# Patient Record
Sex: Female | Born: 1935 | Race: Black or African American | Hispanic: No | Smoking: Never smoker
Health system: Southern US, Community
[De-identification: ages and names within clinical notes are randomized; demographics above are authoritative.]

## PROBLEM LIST (undated history)

## (undated) DIAGNOSIS — E7800 Pure hypercholesterolemia, unspecified: Secondary | ICD-10-CM

## (undated) DIAGNOSIS — I639 Cerebral infarction, unspecified: Secondary | ICD-10-CM

## (undated) DIAGNOSIS — R569 Unspecified convulsions: Secondary | ICD-10-CM

## (undated) DIAGNOSIS — R7881 Bacteremia: Secondary | ICD-10-CM

## (undated) DIAGNOSIS — A419 Sepsis, unspecified organism: Secondary | ICD-10-CM

## (undated) DIAGNOSIS — K810 Acute cholecystitis: Secondary | ICD-10-CM

## (undated) DIAGNOSIS — J42 Unspecified chronic bronchitis: Secondary | ICD-10-CM

## (undated) DIAGNOSIS — E119 Type 2 diabetes mellitus without complications: Secondary | ICD-10-CM

## (undated) DIAGNOSIS — I10 Essential (primary) hypertension: Secondary | ICD-10-CM

## (undated) DIAGNOSIS — N390 Urinary tract infection, site not specified: Secondary | ICD-10-CM

## (undated) DIAGNOSIS — Z1621 Resistance to vancomycin: Secondary | ICD-10-CM

## (undated) DIAGNOSIS — D332 Benign neoplasm of brain, unspecified: Secondary | ICD-10-CM

## (undated) HISTORY — PX: CT PERC CHOLECYSTOSTOMY: HXRAD817

## (undated) HISTORY — PX: ABDOMINAL HYSTERECTOMY: SHX81

## (undated) HISTORY — PX: BRAIN SURGERY: SHX531

---

## 2014-03-11 ENCOUNTER — Emergency Department (HOSPITAL_COMMUNITY): Payer: Medicare Other

## 2014-03-11 ENCOUNTER — Encounter (HOSPITAL_COMMUNITY): Payer: Self-pay | Admitting: Emergency Medicine

## 2014-03-11 ENCOUNTER — Inpatient Hospital Stay (HOSPITAL_COMMUNITY)
Admission: EM | Admit: 2014-03-11 | Discharge: 2014-03-12 | DRG: 683 | Disposition: A | Discharge status: Home / Self Care | Payer: Medicare Other | Attending: Internal Medicine | Admitting: Internal Medicine

## 2014-03-11 DIAGNOSIS — N390 Urinary tract infection, site not specified: Secondary | ICD-10-CM

## 2014-03-11 DIAGNOSIS — Z7982 Long term (current) use of aspirin: Secondary | ICD-10-CM

## 2014-03-11 DIAGNOSIS — E119 Type 2 diabetes mellitus without complications: Secondary | ICD-10-CM | POA: Diagnosis present

## 2014-03-11 DIAGNOSIS — I69354 Hemiplegia and hemiparesis following cerebral infarction affecting left non-dominant side: Secondary | ICD-10-CM

## 2014-03-11 DIAGNOSIS — E780 Pure hypercholesterolemia: Secondary | ICD-10-CM | POA: Diagnosis present

## 2014-03-11 DIAGNOSIS — I10 Essential (primary) hypertension: Secondary | ICD-10-CM | POA: Diagnosis present

## 2014-03-11 DIAGNOSIS — R531 Weakness: Secondary | ICD-10-CM

## 2014-03-11 DIAGNOSIS — I69359 Hemiplegia and hemiparesis following cerebral infarction affecting unspecified side: Secondary | ICD-10-CM

## 2014-03-11 DIAGNOSIS — Z90710 Acquired absence of both cervix and uterus: Secondary | ICD-10-CM

## 2014-03-11 DIAGNOSIS — G40909 Epilepsy, unspecified, not intractable, without status epilepticus: Secondary | ICD-10-CM

## 2014-03-11 DIAGNOSIS — R55 Syncope and collapse: Secondary | ICD-10-CM | POA: Diagnosis not present

## 2014-03-11 DIAGNOSIS — J42 Unspecified chronic bronchitis: Secondary | ICD-10-CM | POA: Diagnosis present

## 2014-03-11 DIAGNOSIS — F0390 Unspecified dementia without behavioral disturbance: Secondary | ICD-10-CM | POA: Insufficient documentation

## 2014-03-11 DIAGNOSIS — N179 Acute kidney failure, unspecified: Secondary | ICD-10-CM | POA: Diagnosis not present

## 2014-03-11 DIAGNOSIS — W19XXXA Unspecified fall, initial encounter: Secondary | ICD-10-CM

## 2014-03-11 DIAGNOSIS — I951 Orthostatic hypotension: Secondary | ICD-10-CM | POA: Diagnosis present

## 2014-03-11 DIAGNOSIS — Z885 Allergy status to narcotic agent status: Secondary | ICD-10-CM

## 2014-03-11 DIAGNOSIS — E860 Dehydration: Secondary | ICD-10-CM | POA: Diagnosis present

## 2014-03-11 HISTORY — DX: Cerebral infarction, unspecified: I63.9

## 2014-03-11 HISTORY — DX: Unspecified convulsions: R56.9

## 2014-03-11 HISTORY — DX: Type 2 diabetes mellitus without complications: E11.9

## 2014-03-11 HISTORY — DX: Essential (primary) hypertension: I10

## 2014-03-11 HISTORY — DX: Unspecified chronic bronchitis: J42

## 2014-03-11 HISTORY — DX: Pure hypercholesterolemia, unspecified: E78.00

## 2014-03-11 HISTORY — DX: Benign neoplasm of brain, unspecified: D33.2

## 2014-03-11 LAB — URINE MICROSCOPIC-ADD ON

## 2014-03-11 LAB — CBC WITH DIFFERENTIAL/PLATELET
BASOS ABS: 0 10*3/uL (ref 0.0–0.1)
BASOS PCT: 0 % (ref 0–1)
Eosinophils Absolute: 0.3 10*3/uL (ref 0.0–0.7)
Eosinophils Relative: 3 % (ref 0–5)
HEMATOCRIT: 33.7 % — AB (ref 36.0–46.0)
HEMOGLOBIN: 10.9 g/dL — AB (ref 12.0–15.0)
Lymphocytes Relative: 12 % (ref 12–46)
Lymphs Abs: 1.2 10*3/uL (ref 0.7–4.0)
MCH: 32.2 pg (ref 26.0–34.0)
MCHC: 32.3 g/dL (ref 30.0–36.0)
MCV: 99.4 fL (ref 78.0–100.0)
MONO ABS: 0.7 10*3/uL (ref 0.1–1.0)
MONOS PCT: 7 % (ref 3–12)
NEUTROS ABS: 7.6 10*3/uL (ref 1.7–7.7)
Neutrophils Relative %: 78 % — ABNORMAL HIGH (ref 43–77)
Platelets: 248 10*3/uL (ref 150–400)
RBC: 3.39 MIL/uL — ABNORMAL LOW (ref 3.87–5.11)
RDW: 13.1 % (ref 11.5–15.5)
WBC: 9.8 10*3/uL (ref 4.0–10.5)

## 2014-03-11 LAB — URINALYSIS, ROUTINE W REFLEX MICROSCOPIC
Bilirubin Urine: NEGATIVE
Glucose, UA: NEGATIVE mg/dL
Hgb urine dipstick: NEGATIVE
Ketones, ur: NEGATIVE mg/dL
Nitrite: NEGATIVE
PROTEIN: NEGATIVE mg/dL
SPECIFIC GRAVITY, URINE: 1.02 (ref 1.005–1.030)
Urobilinogen, UA: 0.2 mg/dL (ref 0.0–1.0)
pH: 5.5 (ref 5.0–8.0)

## 2014-03-11 LAB — CARBAMAZEPINE LEVEL, TOTAL: CARBAMAZEPINE LVL: 9.8 ug/mL (ref 4.0–12.0)

## 2014-03-11 LAB — BASIC METABOLIC PANEL
ANION GAP: 12 (ref 5–15)
BUN: 27 mg/dL — AB (ref 6–23)
CO2: 28 mEq/L (ref 19–32)
Calcium: 8.8 mg/dL (ref 8.4–10.5)
Chloride: 99 mEq/L (ref 96–112)
Creatinine, Ser: 1.74 mg/dL — ABNORMAL HIGH (ref 0.50–1.10)
GFR calc non Af Amer: 25 mL/min — ABNORMAL LOW (ref >90)
GFR, EST AFRICAN AMERICAN: 29 mL/min — AB (ref >90)
Glucose, Bld: 273 mg/dL — ABNORMAL HIGH (ref 70–99)
POTASSIUM: 4.4 meq/L (ref 3.7–5.3)
Sodium: 139 mEq/L (ref 137–147)

## 2014-03-11 LAB — TROPONIN I: Troponin I: <0.3 ng/mL (ref <0.30)

## 2014-03-11 MED ORDER — DEXTROSE 5 % IV SOLN
1.0000 g | Freq: Once | INTRAVENOUS | Status: AC
  Administered 2014-03-11: 1 g via INTRAVENOUS
  Filled 2014-03-11: qty 10

## 2014-03-11 NOTE — H&P (Signed)
PCP:   No primary care provider on file.   Chief Complaint:  Almost Passed out  HPI: 80 yo female h/o cva with residual left sided weakness, htn, dm, benign brain tumor s/p resection ten years ago, seizure disorder comes in after getting very weak and almost passing out today witnessed by dtr.  She lives with her dtr.  Just dx with uti last couple of days but not given abx.  No fevers.  No report of sz like activity during event and did not LOC and no sz in years.  Not eating or drinking well for several days.  unk baseline renal function.  Her baseline is left sided hemiparesis needs help walking with a walker.  But has been more weak than normal for 2 days.  No n/v/d.  ??h/o dementia also.  Was back to her baseline quickly by time of arrival to ED.  Review of Systems:  Positive and negative as per HPI otherwise all other systems are negative per dtr.  Past Medical History: Past Medical History  Diagnosis Date  . Stroke   . Diabetes mellitus without complication   . Hypertension   . High cholesterol   . Brain tumor (benign)   . Chronic bronchitis   . Seizures    Past Surgical History  Procedure Laterality Date  . Brain surgery    . Abdominal hysterectomy      Medications: Prior to Admission medications   Medication Sig Start Date End Date Taking? Authorizing Provider  amLODipine (NORVASC) 10 MG tablet Take 10 mg by mouth daily.   Yes Historical Provider, MD  aspirin EC 81 MG tablet Take 81 mg by mouth daily.   Yes Historical Provider, MD  atenolol (TENORMIN) 50 MG tablet Take 50 mg by mouth daily.   Yes Historical Provider, MD  Calcium Carbonate-Vitamin D (CALCARB 600/D PO) Take 1 tablet by mouth 2 (two) times daily.   Yes Historical Provider, MD  carbamazepine (TEGRETOL) 200 MG tablet Take 200 mg by mouth 3 (three) times daily.   Yes Historical Provider, MD  donepezil (ARICEPT) 10 MG tablet Take 10 mg by mouth daily.   Yes Historical Provider, MD  escitalopram (LEXAPRO) 10  MG tablet Take 10 mg by mouth daily.   Yes Historical Provider, MD  Fe Fum-FA-B Cmp-C-Zn-Mg-Mn-Cu (HEMATINIC PLUS COMPLEX) 106-1 MG TABS Take 1 tablet by mouth daily.   Yes Historical Provider, MD  gabapentin (NEURONTIN) 100 MG capsule Take 100 mg by mouth daily as needed (for pain).   Yes Historical Provider, MD  glimepiride (AMARYL) 4 MG tablet Take 4 mg by mouth daily with breakfast.   Yes Historical Provider, MD  HYDROcodone-acetaminophen (NORCO/VICODIN) 5-325 MG per tablet Take 1 tablet by mouth daily as needed for moderate pain.   Yes Historical Provider, MD  levothyroxine (SYNTHROID, LEVOTHROID) 25 MCG tablet Take 50 mcg by mouth daily before breakfast.    Yes Historical Provider, MD  meclizine (ANTIVERT) 25 MG tablet Take 25 mg by mouth daily as needed for dizziness.   Yes Historical Provider, MD  memantine (NAMENDA) 10 MG tablet Take 10 mg by mouth 2 (two) times daily.   Yes Historical Provider, MD  pantoprazole (PROTONIX) 20 MG tablet Take 20 mg by mouth daily.   Yes Historical Provider, MD  rosuvastatin (CRESTOR) 20 MG tablet Take 20 mg by mouth every evening.   Yes Historical Provider, MD  valsartan-hydrochlorothiazide (DIOVAN-HCT) 320-25 MG per tablet Take 1 tablet by mouth daily.   Yes Historical Provider, MD  Allergies:   Allergies  Allergen Reactions  . Codeine Other (See Comments)    Unknown reaction    Social History:  reports that she has never smoked. She does not have any smokeless tobacco history on file. She reports that she does not drink alcohol or use illicit drugs.  Family History: CAD  Physical Exam: Filed Vitals:   03/11/14 2041 03/11/14 2109 03/11/14 2130 03/11/14 2230  BP: 138/63  124/64 144/62  Pulse: 76  75   Temp:  98.6 F (37 C)    TempSrc:  Rectal    Resp: 17  15 14   Height:      Weight:      SpO2: 90%  93%    General appearance: alert, cooperative and no distress Head: Normocephalic, without obvious abnormality, atraumatic Eyes:  negative Nose: Nares normal. Septum midline. Mucosa normal. No drainage or sinus tenderness. Neck: no JVD and supple, symmetrical, trachea midline Lungs: clear to auscultation bilaterally Heart: regular rate and rhythm, S1, S2 normal, no murmur, click, rub or gallop Abdomen: soft, non-tender; bowel sounds normal; no masses,  no organomegaly Extremities: extremities normal, atraumatic, no cyanosis or edema Pulses: 2+ and symmetric Skin: Skin color, texture, turgor normal. No rashes or lesions Neurologic: Grossly normalleft sided hemiparesis    Labs on Admission:   Recent Labs  03/11/14 2007  NA 139  K 4.4  CL 99  CO2 28  GLUCOSE 273*  BUN 27*  CREATININE 1.74*  CALCIUM 8.8    Recent Labs  03/11/14 2007  WBC 9.8  NEUTROABS 7.6  HGB 10.9*  HCT 33.7*  MCV 99.4  PLT 248    Recent Labs  03/11/14 2007  TROPONINI <0.30   Radiological Exams on Admission: Ct Head Wo Contrast  03/11/2014   CLINICAL DATA:  Acute episode of syncope. Transient unresponsiveness. Initial encounter.  EXAM: CT HEAD WITHOUT CONTRAST  TECHNIQUE: Contiguous axial images were obtained from the base of the skull through the vertex without intravenous contrast.  COMPARISON:  None.  FINDINGS: There is no evidence of acute infarction, mass lesion, or intra- or extra-axial hemorrhage on CT.  Prominence of the ventricles and sulci reflects mild to moderate cortical volume loss. Cerebellar atrophy is noted. Diffuse periventricular and subcortical white matter change likely reflects small vessel ischemic microangiopathy. Chronic infarct is noted at the right frontal lobe. Chronic lacunar infarcts are seen at the basal ganglia bilaterally.  The brainstem and fourth ventricle are within normal limits. No mass effect or midline shift is seen.  There is no evidence of fracture; a right-sided craniotomy flap is grossly unremarkable in appearance. The visualized portions of the orbits are within normal limits. The  paranasal sinuses and mastoid air cells are well-aerated. Dense calcification is noted along the intracranial portions of the internal carotid arteries, the left middle cerebral artery and the anterior cerebral arteries.  IMPRESSION: 1. No acute intracranial pathology seen on CT. 2. Mild to moderate cortical volume loss and diffuse small vessel ischemic microangiopathy. 3. Chronic infarct at the right frontal lobe, and chronic lacunar infarcts at the basal ganglia bilaterally. 4. Dense calcification involving portions of the intracranial vasculature.   Electronically Signed   By: Garald Balding M.D.   On: 03/11/2014 22:17   Dg Chest Port 1 View  03/11/2014   CLINICAL DATA:  Weakness, near syncope.  EXAM: PORTABLE CHEST - 1 VIEW  COMPARISON:  PA and lateral chest 11/08/2007.  FINDINGS: There is left basilar atelectasis. The right lung is clear. Heart  size is mildly enlarged. Hiatal hernia is noted. No pneumothorax or pleural effusion. Remote left rib fractures are noted.  IMPRESSION: Cardiomegaly without acute disease.  Hiatal hernia.   Electronically Signed   By: Inge Rise M.D.   On: 03/11/2014 20:46    Assessment/Plan  80 yo female with presyncopal episode, generalized weakness, decreased po intake and uti  Principal Problem:   preSyncope-  Likely multifactorial.  Ck orthostatics.  Gentle ivf overnight.  freq neuro cks overnight obs on tele.  No new focal neuro deficits.  If any develop, consider more neuro w/u.  For now till treat her mild dehydration, treat her underlying uti and ck for orthostatic hypotension.  Active Problems:  Stable unless o/w noted   Weakness generalized   CVA, old, hemiparesis   UTI (lower urinary tract infection)  uc pending.  rocephin   Seizure disorder  Does not sound like she had sz/  Cont tegretol.  Place on sz precautions   Hypertension  stable   Acute renal failure-  Unclear if ckd component.  Ivf.  Repeat in am. Likely dementia-  stable  obs on tele.   Full code per daughter.   Allora Bains A 03/11/2014, 11:28 PM

## 2014-03-11 NOTE — ED Notes (Signed)
Attempted IV placement without success.  Marcille Buffy, RN attempted IV placement without success.

## 2014-03-11 NOTE — ED Notes (Signed)
Per EMS, patient ate her dinner and she went to bathroom.  Patient was going to bedroom and became weak; patient was assisted to floor.  Patient had a stroke in 1997 and has left sided residual weakness and facial droop from that.  Patient was not responding to family at home.

## 2014-03-11 NOTE — ED Notes (Signed)
MD at bedside. 

## 2014-03-11 NOTE — ED Notes (Signed)
IV attempts made by 3 nurses with no success. EDP made aware.

## 2014-03-11 NOTE — ED Provider Notes (Addendum)
CSN: [id number]     Arrival date & time 03/11/14  1914 History   First MD Initiated Contact with Patient 03/11/14 1931     Chief Complaint  Patient presents with  . Weakness      HPI  PCP is Dr. Kenton Kingfisher in [facility]  Patient presents for evaluation after a syncopal episode. Patient is accompanied by her daughter, and granddaughter. Daughter was with her when this episode happened. Patient has a history of stroke with residual left-sided weakness and requires assistance with ambulation at baseline. Her daughter was walking with her helping her with her left side walking from the bathroom to another room. Daughter states that she realizes that her mother wasn't responding and she seemed "quite heavily in my arms". Daughter had to help her later the ground. Her mother did not fall. Daughter states that her mother's eyes were open but she was not verbally responsive to her for several minutes. No stereotypical seizure activity no deviation of eyes or tonic-clonic activity no incontinence or tongue biting. Daughter states that it took her several minutes to be able to respond and this was simply yes or no. Daughter called her sister-in-law who is a nursing came over. Between the 2 of them they're unable to help her out because she was still very weak. They called 911, she was transported here.  No recent symptoms of chest pain shortness of breath or fever. Daughter states that a home health care nurse obtained a urine sample week ago and they were told it had a urinary tract infection, however only Azo was called in to the pharmacy, not an antibiotic. Daughter states that this is the first episode of weakness or fall that she has had. However, she states she has been weak and her appetite has been poor for about the last week.  Past Medical History  Diagnosis Date  . Stroke   . Diabetes mellitus without complication   . Hypertension   . High cholesterol   . Brain tumor (benign)   . Chronic  bronchitis   . Seizures    Past Surgical History  Procedure Laterality Date  . Brain surgery    . Abdominal hysterectomy     No family history on file. History  Substance Use Topics  . Smoking status: Never Smoker   . Smokeless tobacco: Not on file  . Alcohol Use: No   OB History    No data available     Review of Systems  Unable to perform ROS: Dementia  Skin: Negative for color change, pallor and rash.  Neurological: Positive for syncope.      Allergies  Codeine  Home Medications   Prior to Admission medications   Medication Sig Start Date End Date Taking? Authorizing Provider  amLODipine (NORVASC) 10 MG tablet Take 10 mg by mouth daily.   Yes Historical Provider, MD  aspirin EC 81 MG tablet Take 81 mg by mouth daily.   Yes Historical Provider, MD  atenolol (TENORMIN) 50 MG tablet Take 50 mg by mouth daily.   Yes Historical Provider, MD  Calcium Carbonate-Vitamin D (CALCARB 600/D PO) Take 1 tablet by mouth 2 (two) times daily.   Yes Historical Provider, MD  carbamazepine (TEGRETOL) 200 MG tablet Take 200 mg by mouth 3 (three) times daily.   Yes Historical Provider, MD  donepezil (ARICEPT) 10 MG tablet Take 10 mg by mouth daily.   Yes Historical Provider, MD  escitalopram (LEXAPRO) 10 MG tablet Take 10 mg by mouth daily.  Yes Historical Provider, MD  Fe Fum-FA-B Cmp-C-Zn-Mg-Mn-Cu (HEMATINIC PLUS COMPLEX) 106-1 MG TABS Take 1 tablet by mouth daily.   Yes Historical Provider, MD  gabapentin (NEURONTIN) 100 MG capsule Take 100 mg by mouth daily as needed (for pain).   Yes Historical Provider, MD  glimepiride (AMARYL) 4 MG tablet Take 4 mg by mouth daily with breakfast.   Yes Historical Provider, MD  HYDROcodone-acetaminophen (NORCO/VICODIN) 5-325 MG per tablet Take 1 tablet by mouth daily as needed for moderate pain.   Yes Historical Provider, MD  levothyroxine (SYNTHROID, LEVOTHROID) 25 MCG tablet Take 50 mcg by mouth daily before breakfast.    Yes Historical Provider, MD   meclizine (ANTIVERT) 25 MG tablet Take 25 mg by mouth daily as needed for dizziness.   Yes Historical Provider, MD  memantine (NAMENDA) 10 MG tablet Take 10 mg by mouth 2 (two) times daily.   Yes Historical Provider, MD  pantoprazole (PROTONIX) 20 MG tablet Take 20 mg by mouth daily.   Yes Historical Provider, MD  rosuvastatin (CRESTOR) 20 MG tablet Take 20 mg by mouth every evening.   Yes Historical Provider, MD  valsartan-hydrochlorothiazide (DIOVAN-HCT) 320-25 MG per tablet Take 1 tablet by mouth daily.   Yes Historical Provider, MD   BP 138/63 mmHg  Pulse 76  Temp(Src) 98.6 F (37 C) (Rectal)  Resp 17  Ht 5\' 2"  (1.575 m)  Wt 145 lb (65.772 kg)  BMI 26.51 kg/m2  SpO2 90% Physical Exam  Constitutional: She is oriented to person, place, and time. She appears listless. No distress.  Hard of hearing. Eyes open. Able to answer yes or no to simple questioning.  HENT:  Head: Normocephalic.  Conjunctiva not pale. Oropharynx shows slightly dry mucous membranes.  Eyes: Conjunctivae are normal. Pupils are equal, round, and reactive to light. No scleral icterus.  Conjunctiva are not pale  Neck: Normal range of motion. Neck supple. No thyromegaly present.  Neck supple  Cardiovascular: Normal rate and regular rhythm.  Exam reveals no gallop and no friction rub.   No murmur heard. Is not tachycardic. No S3 or 4 gallop. No dependent edema.  Pulmonary/Chest: Effort normal and breath sounds normal. No respiratory distress. She has no wheezes. She has no rales.  Clear bilateral breath sounds  Abdominal: Soft. Bowel sounds are normal. She exhibits no distension. There is no tenderness. There is no rebound.  Soft benign abdomen  Musculoskeletal: Normal range of motion.  Neurological: She is oriented to person, place, and time. She appears listless.  Left upper and lower chin the weakness consistent with previous stroke. No other noted deficits. No facial droop. Normal use and strength the right  upper and lower extremity.  Skin: Skin is warm and dry. No rash noted.  Psychiatric: She has a normal mood and affect. Her behavior is normal.    ED Course  Procedures (including critical care time) Labs Review Labs Reviewed  CBC WITH DIFFERENTIAL - Abnormal; Notable for the following:    RBC 3.39 (*)    Hemoglobin 10.9 (*)    HCT 33.7 (*)    Neutrophils Relative % 78 (*)    All other components within normal limits  BASIC METABOLIC PANEL - Abnormal; Notable for the following:    Glucose, Bld 273 (*)    BUN 27 (*)    Creatinine, Ser 1.74 (*)    GFR calc non Af Amer 25 (*)    GFR calc Af Amer 29 (*)    All other components within  normal limits  URINALYSIS, ROUTINE W REFLEX MICROSCOPIC - Abnormal; Notable for the following:    Leukocytes, UA TRACE (*)    All other components within normal limits  URINE MICROSCOPIC-ADD ON - Abnormal; Notable for the following:    Squamous Epithelial / LPF FEW (*)    Bacteria, UA MANY (*)    Casts HYALINE CASTS (*)    All other components within normal limits  URINE CULTURE  TROPONIN I  CARBAMAZEPINE LEVEL, TOTAL    Imaging Review Dg Chest Port 1 View  03/11/2014   CLINICAL DATA:  Weakness, near syncope.  EXAM: PORTABLE CHEST - 1 VIEW  COMPARISON:  PA and lateral chest 11/08/2007.  FINDINGS: There is left basilar atelectasis. The right lung is clear. Heart size is mildly enlarged. Hiatal hernia is noted. No pneumothorax or pleural effusion. Remote left rib fractures are noted.  IMPRESSION: Cardiomegaly without acute disease.  Hiatal hernia.   Electronically Signed   By: Inge Rise M.D.   On: 03/11/2014 20:46     EKG Interpretation   Date/Time:  Tuesday March 11 2014 19:33:18 EST Ventricular Rate:  74 PR Interval:  203 QRS Duration: 87 QT Interval:  407 QTC Calculation: 451 R Axis:   -4 Text Interpretation:  Sinus rhythm Inverted T waves Baseline wander in  lead(s) V3 Confirmed by Jeneen Rinks  MD, Browning ([id number]) on 03/11/2014 8:48:33  PM      MDM   Final diagnoses:  Syncope  UTI (lower urinary tract infection)    Syncope without obvious source. Urine shows signs of UTI, although nitrite negative.. Culture pending. Family states that they were called with a positive urine test" daughter was uncertain if this was a positive cultures are simply UA.   Antibiotic started.. EKG shows no acute or ischemic changes. Mild elevation of creatinine 1.74 without known baseline, may represent slight acute kidney injury. Her fall may have been multifactorial. Was not frankly syncope , no ever she was minimally responsive. I think admission for antibiotics, hydration, telemetry is indicated. Plan will be admission, antibiotics.    Tanna Furry, MD 03/11/14 2136  Tanna Furry, MD 03/11/14 5056  Tanna Furry, MD 03/11/14 [phone]

## 2014-03-12 ENCOUNTER — Encounter (HOSPITAL_COMMUNITY): Payer: Self-pay | Admitting: *Deleted

## 2014-03-12 DIAGNOSIS — E780 Pure hypercholesterolemia: Secondary | ICD-10-CM | POA: Diagnosis present

## 2014-03-12 DIAGNOSIS — I10 Essential (primary) hypertension: Secondary | ICD-10-CM | POA: Diagnosis present

## 2014-03-12 DIAGNOSIS — Z885 Allergy status to narcotic agent status: Secondary | ICD-10-CM | POA: Diagnosis not present

## 2014-03-12 DIAGNOSIS — I69354 Hemiplegia and hemiparesis following cerebral infarction affecting left non-dominant side: Secondary | ICD-10-CM | POA: Diagnosis not present

## 2014-03-12 DIAGNOSIS — I951 Orthostatic hypotension: Secondary | ICD-10-CM | POA: Diagnosis present

## 2014-03-12 DIAGNOSIS — J42 Unspecified chronic bronchitis: Secondary | ICD-10-CM | POA: Diagnosis present

## 2014-03-12 DIAGNOSIS — Z90710 Acquired absence of both cervix and uterus: Secondary | ICD-10-CM | POA: Diagnosis not present

## 2014-03-12 DIAGNOSIS — N179 Acute kidney failure, unspecified: Secondary | ICD-10-CM | POA: Diagnosis present

## 2014-03-12 DIAGNOSIS — G40909 Epilepsy, unspecified, not intractable, without status epilepticus: Secondary | ICD-10-CM | POA: Diagnosis present

## 2014-03-12 DIAGNOSIS — N390 Urinary tract infection, site not specified: Secondary | ICD-10-CM | POA: Diagnosis present

## 2014-03-12 DIAGNOSIS — Z7982 Long term (current) use of aspirin: Secondary | ICD-10-CM | POA: Diagnosis not present

## 2014-03-12 DIAGNOSIS — F0390 Unspecified dementia without behavioral disturbance: Secondary | ICD-10-CM | POA: Diagnosis present

## 2014-03-12 DIAGNOSIS — E119 Type 2 diabetes mellitus without complications: Secondary | ICD-10-CM | POA: Diagnosis present

## 2014-03-12 DIAGNOSIS — R55 Syncope and collapse: Secondary | ICD-10-CM | POA: Diagnosis present

## 2014-03-12 LAB — BASIC METABOLIC PANEL
Anion gap: 14 (ref 5–15)
BUN: 27 mg/dL — AB (ref 6–23)
CHLORIDE: 99 meq/L (ref 96–112)
CO2: 27 meq/L (ref 19–32)
Calcium: 8.8 mg/dL (ref 8.4–10.5)
Creatinine, Ser: 1.7 mg/dL — ABNORMAL HIGH (ref 0.50–1.10)
GFR calc Af Amer: 30 mL/min — ABNORMAL LOW (ref >90)
GFR calc non Af Amer: 26 mL/min — ABNORMAL LOW (ref >90)
GLUCOSE: 168 mg/dL — AB (ref 70–99)
POTASSIUM: 4.2 meq/L (ref 3.7–5.3)
Sodium: 140 mEq/L (ref 137–147)

## 2014-03-12 LAB — TROPONIN I
Troponin I: <0.3 ng/mL (ref <0.30)
Troponin I: <0.3 ng/mL (ref <0.30)
Troponin I: <0.3 ng/mL (ref <0.30)

## 2014-03-12 LAB — GLUCOSE, CAPILLARY: Glucose-Capillary: 101 mg/dL — ABNORMAL HIGH (ref 70–99)

## 2014-03-12 LAB — CBC
HEMATOCRIT: 33.8 % — AB (ref 36.0–46.0)
HEMOGLOBIN: 11 g/dL — AB (ref 12.0–15.0)
MCH: 32.2 pg (ref 26.0–34.0)
MCHC: 32.5 g/dL (ref 30.0–36.0)
MCV: 98.8 fL (ref 78.0–100.0)
Platelets: 257 10*3/uL (ref 150–400)
RBC: 3.42 MIL/uL — ABNORMAL LOW (ref 3.87–5.11)
RDW: 13 % (ref 11.5–15.5)
WBC: 9.9 10*3/uL (ref 4.0–10.5)

## 2014-03-12 MED ORDER — AMLODIPINE BESYLATE 5 MG PO TABS
10.0000 mg | ORAL_TABLET | Freq: Every day | ORAL | Status: DC
  Administered 2014-03-12: 10 mg via ORAL
  Filled 2014-03-12: qty 2

## 2014-03-12 MED ORDER — GABAPENTIN 100 MG PO CAPS: 100.0000 mg | ORAL_CAPSULE | Freq: Every day | ORAL | Status: DC | PRN

## 2014-03-12 MED ORDER — LEVOTHYROXINE SODIUM 50 MCG PO TABS
50.0000 ug | ORAL_TABLET | Freq: Every day | ORAL | Status: DC
  Administered 2014-03-12: 50 ug via ORAL
  Filled 2014-03-12: qty 1

## 2014-03-12 MED ORDER — ONDANSETRON HCL 4 MG/2ML IJ SOLN: 4.0000 mg | Freq: Four times a day (QID) | INTRAMUSCULAR | Status: DC | PRN

## 2014-03-12 MED ORDER — ROSUVASTATIN CALCIUM 20 MG PO TABS: 20.0000 mg | ORAL_TABLET | Freq: Every evening | ORAL | Status: DC

## 2014-03-12 MED ORDER — ESCITALOPRAM OXALATE 10 MG PO TABS
10.0000 mg | ORAL_TABLET | Freq: Every day | ORAL | Status: DC
  Administered 2014-03-12: 10 mg via ORAL
  Filled 2014-03-12: qty 1

## 2014-03-12 MED ORDER — CARBAMAZEPINE 200 MG PO TABS
200.0000 mg | ORAL_TABLET | Freq: Three times a day (TID) | ORAL | Status: DC
  Administered 2014-03-12 (×2): 200 mg via ORAL
  Filled 2014-03-12 (×2): qty 1

## 2014-03-12 MED ORDER — CEFTRIAXONE SODIUM IN DEXTROSE 20 MG/ML IV SOLN
1.0000 g | INTRAVENOUS | Status: DC
  Filled 2014-03-12: qty 50

## 2014-03-12 MED ORDER — HYDRALAZINE HCL 25 MG PO TABS: 25.0000 mg | ORAL_TABLET | Freq: Three times a day (TID) | ORAL | Status: DC

## 2014-03-12 MED ORDER — SODIUM CHLORIDE 0.9 % IV SOLN
INTRAVENOUS | Status: AC
  Administered 2014-03-12: via INTRAVENOUS

## 2014-03-12 MED ORDER — MECLIZINE HCL 12.5 MG PO TABS: 25.0000 mg | ORAL_TABLET | Freq: Every day | ORAL | Status: DC | PRN

## 2014-03-12 MED ORDER — SODIUM CHLORIDE 0.9 % IJ SOLN
3.0000 mL | Freq: Two times a day (BID) | INTRAMUSCULAR | Status: DC
  Administered 2014-03-12 (×2): 3 mL via INTRAVENOUS

## 2014-03-12 MED ORDER — DONEPEZIL HCL 5 MG PO TABS
10.0000 mg | ORAL_TABLET | Freq: Every day | ORAL | Status: DC
  Administered 2014-03-12: 10 mg via ORAL
  Filled 2014-03-12: qty 2

## 2014-03-12 MED ORDER — ALUM & MAG HYDROXIDE-SIMETH 200-200-20 MG/5ML PO SUSP: 30.0000 mL | Freq: Four times a day (QID) | ORAL | Status: DC | PRN

## 2014-03-12 MED ORDER — ONDANSETRON HCL 4 MG PO TABS: 4.0000 mg | ORAL_TABLET | Freq: Four times a day (QID) | ORAL | Status: DC | PRN

## 2014-03-12 MED ORDER — HYDROCODONE-ACETAMINOPHEN 5-325 MG PO TABS: 1.0000 | ORAL_TABLET | Freq: Every day | ORAL | Status: DC | PRN

## 2014-03-12 MED ORDER — MEMANTINE HCL 10 MG PO TABS
10.0000 mg | ORAL_TABLET | Freq: Two times a day (BID) | ORAL | Status: DC
  Administered 2014-03-12 (×2): 10 mg via ORAL
  Filled 2014-03-12 (×2): qty 1

## 2014-03-12 MED ORDER — ATENOLOL 25 MG PO TABS
50.0000 mg | ORAL_TABLET | Freq: Every day | ORAL | Status: DC
  Administered 2014-03-12: 50 mg via ORAL
  Filled 2014-03-12: qty 2

## 2014-03-12 MED ORDER — CIPROFLOXACIN HCL 250 MG PO TABS: 250.0000 mg | ORAL_TABLET | Freq: Two times a day (BID) | ORAL | Status: DC

## 2014-03-12 MED ORDER — GLIMEPIRIDE 2 MG PO TABS
4.0000 mg | ORAL_TABLET | Freq: Every day | ORAL | Status: DC
  Administered 2014-03-12: 4 mg via ORAL
  Filled 2014-03-12: qty 2

## 2014-03-12 MED ORDER — PANTOPRAZOLE SODIUM 20 MG PO TBEC
20.0000 mg | DELAYED_RELEASE_TABLET | Freq: Every day | ORAL | Status: DC
  Filled 2014-03-12 (×3): qty 1

## 2014-03-12 MED ORDER — ASPIRIN EC 81 MG PO TBEC
81.0000 mg | DELAYED_RELEASE_TABLET | Freq: Every day | ORAL | Status: DC
  Administered 2014-03-12: 81 mg via ORAL
  Filled 2014-03-12: qty 1

## 2014-03-12 MED ORDER — PANTOPRAZOLE SODIUM 40 MG PO PACK
20.0000 mg | PACK | Freq: Every day | ORAL | Status: DC
  Administered 2014-03-12: 20 mg via ORAL
  Filled 2014-03-12 (×3): qty 20

## 2014-03-12 NOTE — Plan of Care (Signed)
Problem: Progression Outcomes Goal: Progressive activity as tolerated Outcome: Completed/Met Date Met:  03/12/14 Goal: Pain controlled Outcome: Completed/Met Date Met:  03/12/14 Goal: Educational plan initiated Outcome: Completed/Met Date Met:  03/12/14

## 2014-03-12 NOTE — Plan of Care (Signed)
Problem: Acute Treatment Outcomes Goal: Neuro exam at baseline or improved Outcome: Completed/Met Date Met:  03/12/14 Goal: BP within ordered parameters Outcome: Completed/Met Date Met:  03/12/14 Within pt range Goal: Airway maintained/protected Outcome: Completed/Met Date Met:  03/12/14 Goal: 02 Sats > 94% Outcome: Completed/Met Date Met:  03/12/14

## 2014-03-12 NOTE — Progress Notes (Signed)
Inpatient Diabetes Program Recommendations  AACE/ADA: New Consensus Statement on Inpatient Glycemic Control (2013)  Target Ranges:  Prepandial:   less than 140 mg/dL      Peak postprandial:   less than 180 mg/dL (1-2 hours)      Critically ill patients:  140 - 180 mg/dL  Results for NYILAH, KIGHT (MRN [id number]) as of 03/12/2014 08:34  Ref. Range 03/12/2014 08:14  Glucose-Capillary Latest Range: 70-99 mg/dL 101 (H)   Results for WILLENA, JEANCHARLES (MRN [id number]) as of 03/12/2014 08:34  Ref. Range 03/11/2014 20:07 03/12/2014 05:47  Glucose Latest Range: 70-99 mg/dL 273 (H) 168 (H)   Diabetes history: DM2 Outpatient Diabetes medications: Amaryl 4 mg QAM Current orders for Inpatient glycemic control: Amaryl 4 mg QAM  Inpatient Diabetes Program Recommendations Correction (SSI): Please consider ordering CBGs with Novolog sensitive correction scale ACHS. Oral Agents: May want to consider discontinuing Amaryl while inpatient.  Thanks, Barnie Alderman, RN, MSN, CCRN, CDE Diabetes Coordinator Inpatient Diabetes Program [phone] (Team Pager) [phone] ([facility] office) [phone] [facility] office)

## 2014-03-12 NOTE — Progress Notes (Signed)
UR completed 

## 2014-03-12 NOTE — Discharge Summary (Signed)
Physician Discharge Summary  Tina Hunter TZG:[id number] DOB: 02-17-1925 DOA: 03/11/2014  PCP: No primary care provider on file.  Admit date: 03/11/2014 Discharge date: 03/12/2014  Time spent:76minutes  Recommendations for Outpatient Follow-up:  1. Follow-up with primary care physician in 1-2 weeks 2. Patient will panel in 1 week 1 follow-up with primary care physician 3. Valsartan/hydrochlorothiazide has been discontinued due to elevated creatinine\ 4. Started on hydralazine for further blood pressure management  Discharge Diagnoses:  Principal Problem:   Syncope Active Problems:   Weakness generalized   CVA, old, hemiparesis   UTI (lower urinary tract infection)   Seizure disorder   Hypertension   Acute renal failure   Essential hypertension   Dementia   Discharge Condition: Stable  Diet recommendation: Low-salt  Filed Weights   03/11/14 1915 03/12/14 0010  Weight: 65.772 kg (145 lb) 65 kg (143 lb 4.8 oz)    History of present illness:  This patient was admitted to the hospital with near syncope. There is no reported seizure-like activity. Patient did not in fact lose consciousness. She has a history of stroke and residual left-sided hemiparesis. She also has a history of dementia. She was admitted to the hospital for further treatments.  Hospital Course:  She was monitored in the hospital and did not have any significant events. She was not orthostatic on admission. She was adequately hydrated with IV fluids. Urinalysis indicated possible urinary tract infection and she was placed on appropriate antibiotic coverage. Creatinine was noted to be elevated at 1.7, although her baseline creatinine is not entirely clear. This in fact may be her baseline. In any case, her valsartan/hydrochlorothiazide has been discontinued until she follows up with her primary care physician. She would benefit from repeat chemistry panel to be checked by her primary care physician a 1 week. The  patient is confused, but her family reports that her confusion gets worse when she is admitted to the hospital, likely related to her underlying dementia. The patient is eating well and does not appear to be in any distress. She appears appropriate to discharge home.  Procedures:    Consultations:    Discharge Exam: Filed Vitals:   03/12/14 1347  BP: 146/60  Pulse: 68  Temp: 99.1 F (37.3 C)  Resp: 18    General: No acute distress, confused Cardiovascular: S1, S2, regular rate and rhythm Respiratory: Clear to auscultation bilaterally  Discharge Instructions You were cared for by a hospitalist during your hospital stay. If you have any questions about your discharge medications or the care you received while you were in the hospital after you are discharged, you can call the unit and asked to speak with the hospitalist on call if the hospitalist that took care of you is not available. Once you are discharged, your primary care physician will handle any further medical issues. Please note that NO REFILLS for any discharge medications will be authorized once you are discharged, as it is imperative that you return to your primary care physician (or establish a relationship with a primary care physician if you do not have one) for your aftercare needs so that they can reassess your need for medications and monitor your lab values.  Discharge Instructions    Call MD for:  temperature >100.4    Complete by:  As directed      Diet - low sodium heart healthy    Complete by:  As directed      Increase activity slowly    Complete by:  As directed           Discharge Medication List as of 03/12/2014  6:21 PM    START taking these medications   Details  ciprofloxacin (CIPRO) 250 MG tablet Take 1 tablet (250 mg total) by mouth 2 (two) times daily., Starting 03/12/2014, Until Discontinued, Print    hydrALAZINE (APRESOLINE) 25 MG tablet Take 1 tablet (25 mg total) by mouth 3 (three) times  daily., Starting 03/12/2014, Until Discontinued, Print      CONTINUE these medications which have NOT CHANGED   Details  amLODipine (NORVASC) 10 MG tablet Take 10 mg by mouth daily., Until Discontinued, Historical Med    aspirin EC 81 MG tablet Take 81 mg by mouth daily., Until Discontinued, Historical Med    atenolol (TENORMIN) 50 MG tablet Take 50 mg by mouth daily., Until Discontinued, Historical Med    Calcium Carbonate-Vitamin D (CALCARB 600/D PO) Take 1 tablet by mouth 2 (two) times daily., Until Discontinued, Historical Med    carbamazepine (TEGRETOL) 200 MG tablet Take 200 mg by mouth 3 (three) times daily., Until Discontinued, Historical Med    donepezil (ARICEPT) 10 MG tablet Take 10 mg by mouth daily., Until Discontinued, Historical Med    escitalopram (LEXAPRO) 10 MG tablet Take 10 mg by mouth daily., Until Discontinued, Historical Med    Fe Fum-FA-B Cmp-C-Zn-Mg-Mn-Cu (HEMATINIC PLUS COMPLEX) 106-1 MG TABS Take 1 tablet by mouth daily., Until Discontinued, Historical Med    gabapentin (NEURONTIN) 100 MG capsule Take 100 mg by mouth daily as needed (for pain)., Until Discontinued, Historical Med    glimepiride (AMARYL) 4 MG tablet Take 4 mg by mouth daily with breakfast., Until Discontinued, Historical Med    HYDROcodone-acetaminophen (NORCO/VICODIN) 5-325 MG per tablet Take 1 tablet by mouth daily as needed for moderate pain., Until Discontinued, Historical Med    levothyroxine (SYNTHROID, LEVOTHROID) 25 MCG tablet Take 50 mcg by mouth daily before breakfast. , Until Discontinued, Historical Med    meclizine (ANTIVERT) 25 MG tablet Take 25 mg by mouth daily as needed for dizziness., Until Discontinued, Historical Med    memantine (NAMENDA) 10 MG tablet Take 10 mg by mouth 2 (two) times daily., Until Discontinued, Historical Med    pantoprazole (PROTONIX) 20 MG tablet Take 20 mg by mouth daily., Until Discontinued, Historical Med    rosuvastatin (CRESTOR) 20 MG tablet  Take 20 mg by mouth every evening., Until Discontinued, Historical Med    valsartan-hydrochlorothiazide (DIOVAN-HCT) 320-25 MG per tablet Take 1 tablet by mouth daily., Until Discontinued, Historical Med       Allergies  Allergen Reactions  . Codeine Other (See Comments)    Unknown reaction      The results of significant diagnostics from this hospitalization (including imaging, microbiology, ancillary and laboratory) are listed below for reference.    Significant Diagnostic Studies: Ct Head Wo Contrast  03/11/2014   CLINICAL DATA:  Acute episode of syncope. Transient unresponsiveness. Initial encounter.  EXAM: CT HEAD WITHOUT CONTRAST  TECHNIQUE: Contiguous axial images were obtained from the base of the skull through the vertex without intravenous contrast.  COMPARISON:  None.  FINDINGS: There is no evidence of acute infarction, mass lesion, or intra- or extra-axial hemorrhage on CT.  Prominence of the ventricles and sulci reflects mild to moderate cortical volume loss. Cerebellar atrophy is noted. Diffuse periventricular and subcortical white matter change likely reflects small vessel ischemic microangiopathy. Chronic infarct is noted at the right frontal lobe. Chronic lacunar infarcts are seen at the  basal ganglia bilaterally.  The brainstem and fourth ventricle are within normal limits. No mass effect or midline shift is seen.  There is no evidence of fracture; a right-sided craniotomy flap is grossly unremarkable in appearance. The visualized portions of the orbits are within normal limits. The paranasal sinuses and mastoid air cells are well-aerated. Dense calcification is noted along the intracranial portions of the internal carotid arteries, the left middle cerebral artery and the anterior cerebral arteries.  IMPRESSION: 1. No acute intracranial pathology seen on CT. 2. Mild to moderate cortical volume loss and diffuse small vessel ischemic microangiopathy. 3. Chronic infarct at the right  frontal lobe, and chronic lacunar infarcts at the basal ganglia bilaterally. 4. Dense calcification involving portions of the intracranial vasculature.   Electronically Signed   By: Garald Balding M.D.   On: 03/11/2014 22:17   Dg Chest Port 1 View  03/11/2014   CLINICAL DATA:  Weakness, near syncope.  EXAM: PORTABLE CHEST - 1 VIEW  COMPARISON:  PA and lateral chest 11/08/2007.  FINDINGS: There is left basilar atelectasis. The right lung is clear. Heart size is mildly enlarged. Hiatal hernia is noted. No pneumothorax or pleural effusion. Remote left rib fractures are noted.  IMPRESSION: Cardiomegaly without acute disease.  Hiatal hernia.   Electronically Signed   By: Inge Rise M.D.   On: 03/11/2014 20:46    Microbiology: No results found for this or any previous visit (from the past 240 hour(s)).   Labs: Basic Metabolic Panel:  Recent Labs Lab 03/11/14 2007 03/12/14 0547  NA 139 140  K 4.4 4.2  CL 99 99  CO2 28 27  GLUCOSE 273* 168*  BUN 27* 27*  CREATININE 1.74* 1.70*  CALCIUM 8.8 8.8   Liver Function Tests: No results for input(s): AST, ALT, ALKPHOS, BILITOT, PROT, ALBUMIN in the last 168 hours. No results for input(s): LIPASE, AMYLASE in the last 168 hours. No results for input(s): AMMONIA in the last 168 hours. CBC:  Recent Labs Lab 03/11/14 2007 03/12/14 0534  WBC 9.8 9.9  NEUTROABS 7.6  --   HGB 10.9* 11.0*  HCT 33.7* 33.8*  MCV 99.4 98.8  PLT 248 257   Cardiac Enzymes:  Recent Labs Lab 03/11/14 2007 03/12/14 0017 03/12/14 0547 03/12/14 1158  TROPONINI <0.30 <0.30 <0.30 <0.30   BNP: BNP (last 3 results) No results for input(s): PROBNP in the last 8760 hours. CBG:  Recent Labs Lab 03/12/14 0814  GLUCAP 101*       Signed:  Ayaansh Smail  [facility] 03/12/2014, 7:34 PM

## 2014-03-12 NOTE — Care Management Note (Signed)
    Page 1 of 1   03/12/2014     1:38:04 PM CARE MANAGEMENT NOTE 03/12/2014  Patient:  Tina Hunter, Tina Hunter   Account Number:  [id number]  Date Initiated:  03/12/2014  Documentation initiated by:  Jolene Provost  Subjective/Objective Assessment:   Pt is from home. Pt lives in [place] with daughter and spends everyother M-F with other daughter in [place]. Pt has aid 6hr/day when at daughters hourse in [place]. Pt has walker and home O2 for nighttime use.     Action/Plan:   Pt gets O2 from [facility] and has used them for [facility] services in the past. Family requests [facility] if [facility] services are needed again. Plan for pt to discharge home in the next 24 hours. No CM needs at this time.   Anticipated DC Date:  03/13/2014   Anticipated DC Plan:  [place]  CM consult      Choice offered to / List presented to:             Status of service:  Completed, signed off Medicare Important Message given?   (If response is "NO", the following Medicare IM given date fields will be blank) Date Medicare IM given:   Medicare IM given by:   Date Additional Medicare IM given:   Additional Medicare IM given by:    Discharge Disposition:  HOME/SELF CARE  Per UR Regulation:    If discussed at Long Length of Stay Meetings, dates discussed:    Comments:  03/12/2014 [place], RN, MSN, [facility]

## 2014-03-14 LAB — URINE CULTURE

## 2014-08-25 ENCOUNTER — Emergency Department (HOSPITAL_COMMUNITY): Payer: Medicare Other

## 2014-08-25 ENCOUNTER — Inpatient Hospital Stay (HOSPITAL_COMMUNITY): Payer: Medicare Other

## 2014-08-25 ENCOUNTER — Inpatient Hospital Stay (HOSPITAL_COMMUNITY)
Admission: EM | Admit: 2014-08-25 | Discharge: 2014-09-09 | DRG: 853 | Disposition: A | Discharge status: Skilled Nursing Facility | Payer: Medicare Other | Attending: Internal Medicine | Admitting: Internal Medicine

## 2014-08-25 ENCOUNTER — Encounter (HOSPITAL_COMMUNITY): Payer: Medicare Other

## 2014-08-25 ENCOUNTER — Other Ambulatory Visit (HOSPITAL_COMMUNITY): Payer: Self-pay

## 2014-08-25 ENCOUNTER — Encounter (HOSPITAL_COMMUNITY): Payer: Self-pay | Admitting: Emergency Medicine

## 2014-08-25 DIAGNOSIS — I10 Essential (primary) hypertension: Secondary | ICD-10-CM | POA: Diagnosis not present

## 2014-08-25 DIAGNOSIS — Z1621 Resistance to vancomycin: Secondary | ICD-10-CM | POA: Diagnosis present

## 2014-08-25 DIAGNOSIS — E119 Type 2 diabetes mellitus without complications: Secondary | ICD-10-CM | POA: Diagnosis present

## 2014-08-25 DIAGNOSIS — I69359 Hemiplegia and hemiparesis following cerebral infarction affecting unspecified side: Secondary | ICD-10-CM | POA: Diagnosis not present

## 2014-08-25 DIAGNOSIS — G40909 Epilepsy, unspecified, not intractable, without status epilepticus: Secondary | ICD-10-CM

## 2014-08-25 DIAGNOSIS — I69322 Dysarthria following cerebral infarction: Secondary | ICD-10-CM

## 2014-08-25 DIAGNOSIS — R1312 Dysphagia, oropharyngeal phase: Secondary | ICD-10-CM | POA: Diagnosis present

## 2014-08-25 DIAGNOSIS — A491 Streptococcal infection, unspecified site: Secondary | ICD-10-CM | POA: Diagnosis not present

## 2014-08-25 DIAGNOSIS — D638 Anemia in other chronic diseases classified elsewhere: Secondary | ICD-10-CM | POA: Diagnosis present

## 2014-08-25 DIAGNOSIS — N179 Acute kidney failure, unspecified: Secondary | ICD-10-CM | POA: Diagnosis present

## 2014-08-25 DIAGNOSIS — I69354 Hemiplegia and hemiparesis following cerebral infarction affecting left non-dominant side: Secondary | ICD-10-CM

## 2014-08-25 DIAGNOSIS — D72829 Elevated white blood cell count, unspecified: Secondary | ICD-10-CM | POA: Diagnosis present

## 2014-08-25 DIAGNOSIS — R7881 Bacteremia: Secondary | ICD-10-CM | POA: Diagnosis present

## 2014-08-25 DIAGNOSIS — I5031 Acute diastolic (congestive) heart failure: Secondary | ICD-10-CM | POA: Diagnosis not present

## 2014-08-25 DIAGNOSIS — F0390 Unspecified dementia without behavioral disturbance: Secondary | ICD-10-CM | POA: Diagnosis present

## 2014-08-25 DIAGNOSIS — E039 Hypothyroidism, unspecified: Secondary | ICD-10-CM | POA: Diagnosis present

## 2014-08-25 DIAGNOSIS — R6520 Severe sepsis without septic shock: Secondary | ICD-10-CM | POA: Diagnosis present

## 2014-08-25 DIAGNOSIS — R0600 Dyspnea, unspecified: Secondary | ICD-10-CM | POA: Diagnosis not present

## 2014-08-25 DIAGNOSIS — K810 Acute cholecystitis: Secondary | ICD-10-CM | POA: Diagnosis present

## 2014-08-25 DIAGNOSIS — E118 Type 2 diabetes mellitus with unspecified complications: Secondary | ICD-10-CM | POA: Diagnosis present

## 2014-08-25 DIAGNOSIS — N390 Urinary tract infection, site not specified: Secondary | ICD-10-CM | POA: Diagnosis present

## 2014-08-25 DIAGNOSIS — E870 Hyperosmolality and hypernatremia: Secondary | ICD-10-CM | POA: Diagnosis not present

## 2014-08-25 DIAGNOSIS — R1011 Right upper quadrant pain: Secondary | ICD-10-CM

## 2014-08-25 DIAGNOSIS — B9620 Unspecified Escherichia coli [E. coli] as the cause of diseases classified elsewhere: Secondary | ICD-10-CM | POA: Diagnosis present

## 2014-08-25 DIAGNOSIS — E875 Hyperkalemia: Secondary | ICD-10-CM | POA: Diagnosis not present

## 2014-08-25 DIAGNOSIS — F0150 Vascular dementia without behavioral disturbance: Secondary | ICD-10-CM | POA: Diagnosis present

## 2014-08-25 DIAGNOSIS — E872 Acidosis, unspecified: Secondary | ICD-10-CM | POA: Diagnosis present

## 2014-08-25 DIAGNOSIS — I129 Hypertensive chronic kidney disease with stage 1 through stage 4 chronic kidney disease, or unspecified chronic kidney disease: Secondary | ICD-10-CM | POA: Diagnosis present

## 2014-08-25 DIAGNOSIS — A419 Sepsis, unspecified organism: Secondary | ICD-10-CM | POA: Diagnosis present

## 2014-08-25 DIAGNOSIS — R4702 Dysphasia: Secondary | ICD-10-CM | POA: Diagnosis present

## 2014-08-25 DIAGNOSIS — E876 Hypokalemia: Secondary | ICD-10-CM | POA: Diagnosis present

## 2014-08-25 DIAGNOSIS — R569 Unspecified convulsions: Secondary | ICD-10-CM

## 2014-08-25 DIAGNOSIS — B952 Enterococcus as the cause of diseases classified elsewhere: Secondary | ICD-10-CM | POA: Diagnosis present

## 2014-08-25 DIAGNOSIS — N189 Chronic kidney disease, unspecified: Secondary | ICD-10-CM | POA: Diagnosis present

## 2014-08-25 DIAGNOSIS — R531 Weakness: Secondary | ICD-10-CM

## 2014-08-25 DIAGNOSIS — K8000 Calculus of gallbladder with acute cholecystitis without obstruction: Secondary | ICD-10-CM | POA: Diagnosis present

## 2014-08-25 DIAGNOSIS — R4182 Altered mental status, unspecified: Secondary | ICD-10-CM

## 2014-08-25 DIAGNOSIS — E780 Pure hypercholesterolemia: Secondary | ICD-10-CM | POA: Diagnosis present

## 2014-08-25 DIAGNOSIS — G9340 Encephalopathy, unspecified: Secondary | ICD-10-CM | POA: Diagnosis present

## 2014-08-25 DIAGNOSIS — E669 Obesity, unspecified: Secondary | ICD-10-CM | POA: Diagnosis present

## 2014-08-25 DIAGNOSIS — R109 Unspecified abdominal pain: Secondary | ICD-10-CM

## 2014-08-25 DIAGNOSIS — R0602 Shortness of breath: Secondary | ICD-10-CM

## 2014-08-25 DIAGNOSIS — A4151 Sepsis due to Escherichia coli [E. coli]: Secondary | ICD-10-CM | POA: Diagnosis not present

## 2014-08-25 HISTORY — DX: Bacteremia: R78.81

## 2014-08-25 HISTORY — DX: Acute cholecystitis: K81.0

## 2014-08-25 HISTORY — DX: Resistance to vancomycin: Z16.21

## 2014-08-25 HISTORY — DX: Urinary tract infection, site not specified: N39.0

## 2014-08-25 HISTORY — DX: Sepsis, unspecified organism: A41.9

## 2014-08-25 HISTORY — DX: Enterococcus as the cause of diseases classified elsewhere: B95.2

## 2014-08-25 LAB — LACTIC ACID, PLASMA
LACTIC ACID, VENOUS: 1.1 mmol/L (ref 0.5–2.0)
Lactic Acid, Venous: 3.5 mmol/L (ref 0.5–2.0)

## 2014-08-25 LAB — URINALYSIS, ROUTINE W REFLEX MICROSCOPIC
Glucose, UA: NEGATIVE mg/dL
NITRITE: NEGATIVE
Protein, ur: >300 mg/dL — AB
UROBILINOGEN UA: 0.2 mg/dL (ref 0.0–1.0)
pH: 5 (ref 5.0–8.0)

## 2014-08-25 LAB — CBC WITH DIFFERENTIAL/PLATELET
Basophils Absolute: 0 10*3/uL (ref 0.0–0.1)
Basophils Absolute: 0 10*3/uL (ref 0.0–0.1)
Basophils Relative: 0 % (ref 0–1)
Basophils Relative: 0 % (ref 0–1)
Eosinophils Absolute: 0 10*3/uL (ref 0.0–0.7)
Eosinophils Absolute: 0 10*3/uL (ref 0.0–0.7)
Eosinophils Relative: 0 % (ref 0–5)
Eosinophils Relative: 0 % (ref 0–5)
HCT: 32.1 % — ABNORMAL LOW (ref 36.0–46.0)
HEMATOCRIT: 37.8 % (ref 36.0–46.0)
HEMOGLOBIN: 10.6 g/dL — AB (ref 12.0–15.0)
Hemoglobin: 12.3 g/dL (ref 12.0–15.0)
LYMPHS ABS: 0.3 10*3/uL — AB (ref 0.7–4.0)
Lymphocytes Relative: 6 % — ABNORMAL LOW (ref 12–46)
Lymphocytes Relative: 2 % — ABNORMAL LOW (ref 12–46)
Lymphs Abs: 1.8 10*3/uL (ref 0.7–4.0)
MCH: 31.5 pg (ref 26.0–34.0)
MCH: 31.5 pg (ref 26.0–34.0)
MCHC: 32.5 g/dL (ref 30.0–36.0)
MCHC: 33 g/dL (ref 30.0–36.0)
MCV: 96.7 fL (ref 78.0–100.0)
MCV: 95.5 fL (ref 78.0–100.0)
MONOS PCT: 9 % (ref 3–12)
MONOS PCT: 0 % — AB (ref 3–12)
Monocytes Absolute: 2.8 10*3/uL — ABNORMAL HIGH (ref 0.1–1.0)
Monocytes Absolute: 0 10*3/uL — ABNORMAL LOW (ref 0.1–1.0)
Neutro Abs: 28.6 10*3/uL — ABNORMAL HIGH (ref 1.7–7.7)
Neutro Abs: 12.7 10*3/uL — ABNORMAL HIGH (ref 1.7–7.7)
Neutrophils Relative %: 86 % — ABNORMAL HIGH (ref 43–77)
Neutrophils Relative %: 98 % — ABNORMAL HIGH (ref 43–77)
Platelets: 265 10*3/uL (ref 150–400)
Platelets: 197 10*3/uL (ref 150–400)
RBC: 3.91 MIL/uL (ref 3.87–5.11)
RBC: 3.36 MIL/uL — AB (ref 3.87–5.11)
RDW: 13.9 % (ref 11.5–15.5)
RDW: 14 % (ref 11.5–15.5)
WBC: 33.3 10*3/uL — ABNORMAL HIGH (ref 4.0–10.5)
WBC: 13 10*3/uL — AB (ref 4.0–10.5)

## 2014-08-25 LAB — COMPREHENSIVE METABOLIC PANEL
ALT: 62 U/L — ABNORMAL HIGH (ref 14–54)
AST: 36 U/L (ref 15–41)
Albumin: 3.3 g/dL — ABNORMAL LOW (ref 3.5–5.0)
Alkaline Phosphatase: 69 U/L (ref 38–126)
Anion gap: 12 (ref 5–15)
BILIRUBIN TOTAL: 0.7 mg/dL (ref 0.3–1.2)
BUN: 30 mg/dL — ABNORMAL HIGH (ref 6–20)
CO2: 29 mmol/L (ref 22–32)
Calcium: 8.6 mg/dL — ABNORMAL LOW (ref 8.9–10.3)
Chloride: 100 mmol/L — ABNORMAL LOW (ref 101–111)
Creatinine, Ser: 2.55 mg/dL — ABNORMAL HIGH (ref 0.44–1.00)
GFR calc Af Amer: 18 mL/min — ABNORMAL LOW (ref >60)
GFR calc non Af Amer: 16 mL/min — ABNORMAL LOW (ref >60)
Glucose, Bld: 208 mg/dL — ABNORMAL HIGH (ref 65–99)
Potassium: 4.1 mmol/L (ref 3.5–5.1)
Sodium: 141 mmol/L (ref 135–145)
Total Protein: 7.2 g/dL (ref 6.5–8.1)

## 2014-08-25 LAB — URINE MICROSCOPIC-ADD ON

## 2014-08-25 LAB — I-STAT CG4 LACTIC ACID, ED: LACTIC ACID, VENOUS: 2.96 mmol/L — AB (ref 0.5–2.0)

## 2014-08-25 LAB — GLUCOSE, CAPILLARY
Glucose-Capillary: 161 mg/dL — ABNORMAL HIGH (ref 65–99)
Glucose-Capillary: 117 mg/dL — ABNORMAL HIGH (ref 65–99)

## 2014-08-25 LAB — MRSA PCR SCREENING: MRSA BY PCR: NEGATIVE

## 2014-08-25 LAB — TROPONIN I: Troponin I: 0.03 ng/mL (ref <0.031)

## 2014-08-25 LAB — APTT: aPTT: 32 seconds (ref 24–37)

## 2014-08-25 LAB — PROTIME-INR
INR: 1.41 (ref 0.00–1.49)
Prothrombin Time: 17.4 seconds — ABNORMAL HIGH (ref 11.6–15.2)

## 2014-08-25 LAB — TSH: TSH: 1.403 u[IU]/mL (ref 0.350–4.500)

## 2014-08-25 MED ORDER — SODIUM CHLORIDE 0.9 % IV SOLN
Freq: Once | INTRAVENOUS | Status: AC
  Administered 2014-08-25: 10:00:00 via INTRAVENOUS

## 2014-08-25 MED ORDER — INSULIN ASPART 100 UNIT/ML ~~LOC~~ SOLN: 0.0000 [IU] | Freq: Every day | SUBCUTANEOUS | Status: DC

## 2014-08-25 MED ORDER — LEVETIRACETAM IN NACL 500 MG/100ML IV SOLN
500.0000 mg | Freq: Two times a day (BID) | INTRAVENOUS | Status: DC
  Administered 2014-08-25 – 2014-09-04 (×20): 500 mg via INTRAVENOUS
  Filled 2014-08-25 (×22): qty 100

## 2014-08-25 MED ORDER — ACETAMINOPHEN 325 MG PO TABS
650.0000 mg | ORAL_TABLET | Freq: Four times a day (QID) | ORAL | Status: DC | PRN
Start: 2014-08-25 — End: 2014-09-09
  Administered 2014-08-25: 650 mg via ORAL
  Filled 2014-08-25: qty 2

## 2014-08-25 MED ORDER — SODIUM CHLORIDE 0.9 % IV BOLUS (SEPSIS)
1000.0000 mL | INTRAVENOUS | Status: AC
  Administered 2014-08-25 (×2): 1000 mL via INTRAVENOUS

## 2014-08-25 MED ORDER — INSULIN ASPART 100 UNIT/ML ~~LOC~~ SOLN
0.0000 [IU] | Freq: Three times a day (TID) | SUBCUTANEOUS | Status: DC
  Administered 2014-08-25 – 2014-08-27 (×3): 2 [IU] via SUBCUTANEOUS
  Administered 2014-08-28: 1 [IU] via SUBCUTANEOUS
  Administered 2014-08-28: 2 [IU] via SUBCUTANEOUS
  Administered 2014-08-31 – 2014-09-01 (×3): 1 [IU] via SUBCUTANEOUS
  Administered 2014-09-01 – 2014-09-02 (×3): 2 [IU] via SUBCUTANEOUS
  Administered 2014-09-03: 1 [IU] via SUBCUTANEOUS
  Administered 2014-09-03 – 2014-09-04 (×3): 2 [IU] via SUBCUTANEOUS
  Administered 2014-09-04 – 2014-09-05 (×3): 1 [IU] via SUBCUTANEOUS
  Administered 2014-09-05: 3 [IU] via SUBCUTANEOUS
  Administered 2014-09-06: 1 [IU] via SUBCUTANEOUS
  Administered 2014-09-06: 2 [IU] via SUBCUTANEOUS
  Administered 2014-09-07: 1 [IU] via SUBCUTANEOUS
  Administered 2014-09-07 – 2014-09-08 (×2): 2 [IU] via SUBCUTANEOUS

## 2014-08-25 MED ORDER — SODIUM CHLORIDE 0.9 % IV SOLN
INTRAVENOUS | Status: DC
  Administered 2014-08-25: 14:00:00 via INTRAVENOUS

## 2014-08-25 MED ORDER — DEXTROSE 5 % IV SOLN
1.0000 g | INTRAVENOUS | Status: DC
  Filled 2014-08-25 (×2): qty 10

## 2014-08-25 MED ORDER — SODIUM CHLORIDE 0.9 % IV SOLN
INTRAVENOUS | Status: DC
  Administered 2014-08-25 – 2014-08-26 (×3): via INTRAVENOUS

## 2014-08-25 MED ORDER — SODIUM CHLORIDE 0.9 % IV BOLUS (SEPSIS)
1000.0000 mL | Freq: Once | INTRAVENOUS | Status: AC
Start: 2014-08-25 — End: 2014-08-26
  Administered 2014-08-25: 1000 mL via INTRAVENOUS

## 2014-08-25 MED ORDER — PIPERACILLIN-TAZOBACTAM 3.375 G IVPB: 3.3750 g | Freq: Once | INTRAVENOUS | Status: DC

## 2014-08-25 MED ORDER — SODIUM CHLORIDE 0.9 % IV BOLUS (SEPSIS)
1000.0000 mL | Freq: Once | INTRAVENOUS | Status: AC
  Administered 2014-08-25: 1000 mL via INTRAVENOUS

## 2014-08-25 MED ORDER — SODIUM CHLORIDE 0.9 % IV SOLN
500.0000 mg | Freq: Two times a day (BID) | INTRAVENOUS | Status: DC
  Filled 2014-08-25 (×2): qty 5

## 2014-08-25 MED ORDER — HEPARIN SODIUM (PORCINE) 5000 UNIT/ML IJ SOLN
5000.0000 [IU] | Freq: Three times a day (TID) | INTRAMUSCULAR | Status: DC
  Administered 2014-08-25 – 2014-09-09 (×44): 5000 [IU] via SUBCUTANEOUS
  Filled 2014-08-25 (×42): qty 1

## 2014-08-25 MED ORDER — VANCOMYCIN HCL IN DEXTROSE 1-5 GM/200ML-% IV SOLN: 1000.0000 mg | Freq: Once | INTRAVENOUS | Status: DC

## 2014-08-25 MED ORDER — DEXTROSE 5 % IV SOLN
1.0000 g | Freq: Once | INTRAVENOUS | Status: AC
  Administered 2014-08-25: 1 g via INTRAVENOUS
  Filled 2014-08-25: qty 10

## 2014-08-25 MED ORDER — CEFTRIAXONE SODIUM IN DEXTROSE 20 MG/ML IV SOLN
1.0000 g | INTRAVENOUS | Status: DC
  Filled 2014-08-25: qty 50

## 2014-08-25 MED ORDER — LEVOTHYROXINE SODIUM 100 MCG IV SOLR
25.0000 ug | Freq: Every day | INTRAVENOUS | Status: DC
  Administered 2014-08-26 – 2014-09-08 (×13): 25 ug via INTRAVENOUS
  Filled 2014-08-25 (×17): qty 5

## 2014-08-25 MED ORDER — CEFTRIAXONE SODIUM IN DEXTROSE 40 MG/ML IV SOLN: 2.0000 g | Freq: Once | INTRAVENOUS | Status: DC

## 2014-08-25 NOTE — Progress Notes (Signed)
ANTIBIOTIC CONSULT NOTE  Pharmacy Consult for Rocephin Indication: rule out sepsis / UTI  Allergies  Allergen Reactions  . Codeine Other (See Comments)    Unknown reaction    Patient Measurements: Height: 5\' 4"  (162.6 cm) Weight: 143 lb (64.864 kg) IBW/kg (Calculated) : 54.7   Vital Signs: Temp: 100.8 F (38.2 C) (05/16 1016) Temp Source: Rectal (05/16 1016) BP: 154/62 mmHg (05/16 1400) Pulse Rate: 89 (05/16 1400) Intake/Output from previous day:   Intake/Output from this shift:    Labs:  Recent Labs  08/25/14 1005  WBC 33.3*  HGB 12.3  PLT 265  CREATININE 2.55*   Estimated Creatinine Clearance: 13.4 mL/min (by C-G formula based on Cr of 2.55). No results for input(s): VANCOTROUGH, VANCOPEAK, VANCORANDOM, GENTTROUGH, GENTPEAK, GENTRANDOM, TOBRATROUGH, TOBRAPEAK, TOBRARND, AMIKACINPEAK, AMIKACINTROU, AMIKACIN in the last 72 hours.   Microbiology: No results found for this or any previous visit (from the past 720 hour(s)).  Anti-infectives    Start     Dose/Rate Route Frequency Ordered Stop   08/26/14 1200  cefTRIAXone (ROCEPHIN) 1 g in dextrose 5 % 50 mL IVPB - Premix     1 g 100 mL/hr over 30 Minutes Intravenous Every 24 hours 08/25/14 1436     08/25/14 1415  cefTRIAXone (ROCEPHIN) 2 g in dextrose 5 % 50 mL IVPB - Premix  Status:  Discontinued     2 g 100 mL/hr over 30 Minutes Intravenous  Once 08/25/14 1412 08/25/14 1418   08/25/14 1115  cefTRIAXone (ROCEPHIN) 1 g in dextrose 5 % 50 mL IVPB     1 g 100 mL/hr over 30 Minutes Intravenous  Once 08/25/14 1103 08/25/14 1252     Assessment: Okay for Protocol, Initial Rocephin dose given in ED.  No adjustment needed for renal function.  Goal of Therapy:  Eradicate infection.   Plan:  Rocephin 1gm IV every 24 hours. Sign off.  Biagio Quint R 08/25/2014,2:36 PM

## 2014-08-25 NOTE — ED Notes (Signed)
Per daughter, pt has not been acting like herself since yesterday.  Is usually able to stand on own and usually talks but has not done either since yesterday.

## 2014-08-25 NOTE — H&P (Signed)
[facility] History and Physical  Tina Hunter XBW:[id number] DOB: Jul 31, 1924 DOA: 08/25/2014  Referring physician: rancour PCP: in [place]   Chief Complaint: ams  HPI: Tina Hunter is a very pleasant 81 y.o. 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 Initial evaluation he feels early sepsis in the setting of urinary tract infection.  Family members indicate that patient has been lethargic over the last 2-3 days. Associated symptoms include anorexia generalized weakness and worsening diabetes, dry cough. Family reports this morning she was unarousable. She relates with a walker and assist of 1. There been no recent falls. No recent complaints of chest pain palpitation, no nausea vomiting abdominal pain. No frequency or urgency but the family does endorse malodorous urine. No shortness of breath.   Workup in the emergency department includes complete blood count significant for leukocytosis of 16.3, basic metabolic panel significant for chloride 100 BUN 30 creatinine 2.55 calcium 8.6 serum glucose 208. Lactic acid 2.96 initial troponin is negative EKG sinus rhythm and premature ventricular beats. Urinalysis with WBCs too numerous to count and many bacteria  The emergency department she is afebrile hemodynamically stable and not hypoxic. Review of Systems:  Unable to complete review of systems with patient due to altered mental status however permission is obtained from her relatives with him she resides   Past Medical History  Diagnosis Date  . Stroke   . Diabetes mellitus without complication   . Hypertension   . High cholesterol   . Brain tumor (benign)   . Chronic bronchitis   . Seizures    Past Surgical History  Procedure Laterality Date  . Brain surgery     . Abdominal hysterectomy     Social History:  reports that she has never smoked. She does not have any smokeless tobacco history on file. She reports that she does not drink alcohol or use illicit drugs. She walks with a walker and the assistance of 1 when in the house. History of stroke 1997 short-term memory issues is able to make once a needs known Allergies  Allergen Reactions  . Codeine Other (See Comments)    Unknown reaction    History reviewed. No pertinent family history. family medical history reviewed and is noncontributory to the admission of this elderly lady  Prior to Admission medications   Medication Sig Start Date End Date Taking? Authorizing Provider  amLODipine (NORVASC) 10 MG tablet Take 10 mg by mouth daily.   Yes Historical Provider, MD  aspirin EC 81 MG tablet Take 81 mg by mouth daily.   Yes Historical Provider, MD  atenolol (TENORMIN) 50 MG tablet Take 50 mg by mouth daily.   Yes Historical Provider, MD  Calcium Carbonate-Vitamin D (CALCARB 600/D PO) Take 1 tablet by mouth 2 (two) times daily.   Yes Historical Provider, MD  carbamazepine (TEGRETOL) 200 MG tablet Take 200 mg by mouth 3 (three) times daily.   Yes Historical Provider, MD  donepezil (ARICEPT) 10 MG tablet Take 10 mg by mouth daily.   Yes Historical Provider, MD  escitalopram (LEXAPRO) 10 MG tablet Take 10 mg by mouth daily.   Yes Historical Provider, MD  Fe Fum-FA-B Cmp-C-Zn-Mg-Mn-Cu (HEMATINIC PLUS COMPLEX) 106-1 MG TABS Take 1 tablet by mouth daily.   Yes Historical Provider, MD  glimepiride (AMARYL) 4 MG tablet Take  4 mg by mouth daily with breakfast.   Yes Historical Provider, MD  levothyroxine (SYNTHROID, LEVOTHROID) 50 MCG tablet Take 50 mcg by mouth daily before breakfast.   Yes Historical Provider, MD  losartan (COZAAR) 100 MG tablet Take 100 mg by mouth daily.   Yes Historical Provider, MD  meclizine (ANTIVERT) 25 MG tablet Take 25 mg by mouth daily as needed for dizziness.   Yes Historical  Provider, MD  memantine (NAMENDA) 10 MG tablet Take 10 mg by mouth 2 (two) times daily.   Yes Historical Provider, MD  pantoprazole (PROTONIX) 20 MG tablet Take 20 mg by mouth daily.   Yes Historical Provider, MD  rosuvastatin (CRESTOR) 20 MG tablet Take 20 mg by mouth every evening.   Yes Historical Provider, MD   Physical Exam: Filed Vitals:   08/25/14 0935 08/25/14 1016 08/25/14 1149 08/25/14 1315  BP: 146/58  149/56 158/63  Pulse: 81  84 90  Temp: 99.4 F (37.4 C) 100.8 F (38.2 C)    TempSrc: Oral Rectal    Resp: 18  18 17   Height: 5\' 4"  (1.626 m)     Weight: 64.864 kg (143 lb)     SpO2: 100%  94% 94%    Wt Readings from Last 3 Encounters:  08/25/14 64.864 kg (143 lb)  03/12/14 65 kg (143 lb 4.8 oz)    General:  Appears somewhat lethargic will respond to verbal stimuli. Somewhat hard of hearing Eyes: PERRL, normal lids, irises & conjunctiva ENT: grossly normal hearing, his membranes of her mouth are pink but dry Neck: no LAD, masses or thyromegaly Cardiovascular: RRR, no m/r/g. No LE edema. Respiratory: CTA bilaterally, no w/r/r. Normal respiratory effort. Abdomen: soft, nd positive bowel sounds but very sluggish mild tenderness in lower right quadrant no rebounding no guarding Skin: no rash or induration seen on limited exam Musculoskeletal: grossly normal tone BUE/BLE Psychiatric: grossly normal mood and affect, speech fluent and appropriate Neurologic: grossly non-focal. Speech slow and deliberate. Left grip weaker than right but is at baseline per family. Left arm contracted           Labs on Admission:  Basic Metabolic Panel:  Recent Labs Lab 08/25/14 1005  NA 141  K 4.1  CL 100*  CO2 29  GLUCOSE 208*  BUN 30*  CREATININE 2.55*  CALCIUM 8.6*   Liver Function Tests:  Recent Labs Lab 08/25/14 1005  AST 36  ALT 62*  ALKPHOS 69  BILITOT 0.7  PROT 7.2  ALBUMIN 3.3*   No results for input(s): LIPASE, AMYLASE in the last 168 hours. No results for  input(s): AMMONIA in the last 168 hours. CBC:  Recent Labs Lab 08/25/14 1005  WBC 33.3*  NEUTROABS 28.6*  HGB 12.3  HCT 37.8  MCV 96.7  PLT 265   Cardiac Enzymes:  Recent Labs Lab 08/25/14 1005  TROPONINI <0.03    BNP (last 3 results) No results for input(s): BNP in the last 8760 hours.  ProBNP (last 3 results) No results for input(s): PROBNP in the last 8760 hours.  CBG: No results for input(s): GLUCAP in the last 168 hours.  Radiological Exams on Admission: Ct Abdomen Pelvis Wo Contrast  08/25/2014   CLINICAL DATA:  Fever and weakness  EXAM: CT ABDOMEN AND PELVIS WITHOUT CONTRAST  TECHNIQUE: Multidetector CT imaging of the abdomen and pelvis was performed following the standard protocol without IV contrast.  COMPARISON:  None.  FINDINGS: Lung bases are free of acute infiltrate or sizable effusion. A  large hiatal hernia is noted.  The liver, spleen, adrenal glands and pancreas are within normal limits. The gallbladder is well distended demonstrates significant wall thickening with pericholecystic inflammatory changes and small dependent gallstones. These changes are suggestive of acute cholecystitis in the appropriate clinical setting. Nonobstructing renal calculi are noted. A large right renal cyst is seen measuring 4.5 cm.  The colon demonstrates diffuse diverticular change. No diverticulitis or abscess is identified. The bladder is well distended. The uterus has been surgically removed. The bony structures are within normal limits. There are changes consistent with an IVC filter.  IMPRESSION: Changes suggestive of acute cholecystitis with gallstones. Ultrasound may be helpful for further evaluation. Correlation with physical exam is recommended as well.  Right renal cyst.  No other focal abnormality is seen.   Electronically Signed   By: Inez Catalina M.D.   On: 08/25/2014 12:54   Dg Chest 1 View  08/25/2014   CLINICAL DATA:  Weakness  EXAM: CHEST  1 VIEW  COMPARISON:   03/11/2014  FINDINGS: Cardiac shadow is mildly enlarged but stable. Diffuse interstitial changes are again identified and stable from the prior exam. Multiple old left rib fractures are again seen. The previously noted hiatal hernia is less well appreciated on the current exam.  IMPRESSION: No acute abnormality noted.   Electronically Signed   By: Inez Catalina M.D.   On: 08/25/2014 11:41   Ct Head Wo Contrast  08/25/2014   CLINICAL DATA:  Recent change in mental status with weakness  EXAM: CT HEAD WITHOUT CONTRAST  TECHNIQUE: Contiguous axial images were obtained from the base of the skull through the vertex without intravenous contrast.  COMPARISON:  03/11/2014  FINDINGS: Bony calvarium again demonstrates postoperative changes on the right. Diffuse atrophic changes are noted. Some areas of encephalomalacia related to the previous surgery are noted. Chronic white matter ischemic changes noted as well. No findings to suggest acute hemorrhage, acute infarction or space-occupying mass lesion are noted. An old lacunar infarct is noted within the basal ganglia on the left.  IMPRESSION: Chronic atrophic and ischemic changes.  No acute abnormality noted.   Electronically Signed   By: Inez Catalina M.D.   On: 08/25/2014 11:34    EKG: Independently reviewed sinus rhythm  Assessment/Plan Principal Problem:   Sepsis: Likely related to urinary tract infection but some concern for possible cholecystitis per CT and clinical presentation. Will admit to step down and follow sepsis protocol. She received 2 L of fluid in the emergency department we'll continue vigorous IV hydration, cycle lactic acid levels obtain blood cultures, urine culture continue Rocephin that was initiated in the emergency department. Consider abdominal ultrasound to further evaluate for cholecystitis. Admission she is hemodynamically stable and not hypoxic Active Problems: UTI (lower urinary tract infection): Urine culture as noted above. Rocephin  that was initiated in the emergency department will be continued.  Acute renal failure: There is probably some component of chronic kidney disease chart review indicates creatinine 1.75 months ago. Currently creatinine 2.55. Will hold any nephrotoxins will provide IV fluids monitor urine output. Recheck in the morning    Weakness generalized: related to above in the setting of left-sided weakness from a stroke in 1997. Will request PT consult tomorrow.  Cholecystitis, acute: CT of the abdomen reveals changes suggestive of acute cholecystitis with gallstones. Chart review indicates patient with a history of gallstones. She has had some nausea vomiting over the last 2 days, mild pain with exam. Will obtain abdominal ultrasound for further  evaluation. Will obtain lipase    Dementia: She somewhat lethargic but is arousable and responds at her baseline according to the family      Seizures: Home medications include Tegretol. Convert this to IV per pharmacy  Hypertension: Controlled. Home medications include amlodipine, atenolol, Cozaar. Will hold these for now. Monitor blood pressure provide when necessary hydralazine     CVA,  Hemiparesis: Stable at baseline we'll request PT consult         Code Status: limited DVT Prophylaxis: Family Communication: daughters at bedside Disposition Plan: home when ready  Time spent: 23 minutes  [facility] Pager [phone]

## 2014-08-25 NOTE — ED Notes (Signed)
Hospitalist informed, not able to obtain 2nd blood culture due to poor venous access.

## 2014-08-25 NOTE — ED Provider Notes (Addendum)
CSN: [id number]     Arrival date & time 08/25/14  0930 History  This chart was scribed for Ezequiel Essex, MD by Thea Alken, ED Scribe. This patient was seen in room APA02/APA02 and the patient's care was started at 9:44 AM.   Chief Complaint  Patient presents with  . Altered Mental Status   Patient is a 81 y.o. female presenting with altered mental status. The history is provided by a relative. The history is limited by the condition of the patient. No language interpreter was used.  Altered Mental Status  LEVEL 5 CAVEAT DUE TO ALTER MENTAL STATUS   HPI Comments:  Tina Hunter is a 81 y.o. female who present to the Emergency Department complaining of confusion that began last night. Per daughters, patient was not alert this morning, had increased sugars in the 200's  and has been sleeping more than usual. They state patient is usually confused but has had worsening confusion. They reports some cough and a fever that began last night. Pt ambulates with walker and assistance and uses the bathroom on her own. Pt has hx of stroke, HTN, high cholesterol, a benign brain tumor, and known gallstones. She has left sided weakness due to hx stroke. Pt takes pain medication as needed. Pt lives with her daughter.    Past Medical History  Diagnosis Date  . Stroke   . Diabetes mellitus without complication   . Hypertension   . High cholesterol   . Brain tumor (benign)   . Chronic bronchitis   . Seizures    Past Surgical History  Procedure Laterality Date  . Brain surgery    . Abdominal hysterectomy     History reviewed. No pertinent family history. History  Substance Use Topics  . Smoking status: Never Smoker   . Smokeless tobacco: Not on file  . Alcohol Use: No   OB History    No data available     Review of Systems  Unable to perform ROS: Mental status change    Allergies  Codeine  Home Medications   Prior to Admission medications   Medication Sig Start Date End Date Taking?  Authorizing Provider  amLODipine (NORVASC) 10 MG tablet Take 10 mg by mouth daily.   Yes Historical Provider, MD  aspirin EC 81 MG tablet Take 81 mg by mouth daily.   Yes Historical Provider, MD  atenolol (TENORMIN) 50 MG tablet Take 50 mg by mouth daily.   Yes Historical Provider, MD  Calcium Carbonate-Vitamin D (CALCARB 600/D PO) Take 1 tablet by mouth 2 (two) times daily.   Yes Historical Provider, MD  carbamazepine (TEGRETOL) 200 MG tablet Take 200 mg by mouth 3 (three) times daily.   Yes Historical Provider, MD  donepezil (ARICEPT) 10 MG tablet Take 10 mg by mouth daily.   Yes Historical Provider, MD  escitalopram (LEXAPRO) 10 MG tablet Take 10 mg by mouth daily.   Yes Historical Provider, MD  Fe Fum-FA-B Cmp-C-Zn-Mg-Mn-Cu (HEMATINIC PLUS COMPLEX) 106-1 MG TABS Take 1 tablet by mouth daily.   Yes Historical Provider, MD  glimepiride (AMARYL) 4 MG tablet Take 4 mg by mouth daily with breakfast.   Yes Historical Provider, MD  levothyroxine (SYNTHROID, LEVOTHROID) 50 MCG tablet Take 50 mcg by mouth daily before breakfast.   Yes Historical Provider, MD  losartan (COZAAR) 100 MG tablet Take 100 mg by mouth daily.   Yes Historical Provider, MD  meclizine (ANTIVERT) 25 MG tablet Take 25 mg by mouth daily as needed for  dizziness.   Yes Historical Provider, MD  memantine (NAMENDA) 10 MG tablet Take 10 mg by mouth 2 (two) times daily.   Yes Historical Provider, MD  pantoprazole (PROTONIX) 20 MG tablet Take 20 mg by mouth daily.   Yes Historical Provider, MD  rosuvastatin (CRESTOR) 20 MG tablet Take 20 mg by mouth every evening.   Yes Historical Provider, MD   BP 146/58 mmHg  Pulse 81  Temp(Src) 99.4 F (37.4 C) (Oral)  Resp 18  Ht 5\' 4"  (1.626 m)  Wt 143 lb (64.864 kg)  BMI 24.53 kg/m2  SpO2 100% Physical Exam  Constitutional: She appears well-developed and well-nourished. No distress.  Silent arouses to pain and voice  HENT:  Head: Normocephalic and atraumatic.  Mouth/Throat: No  oropharyngeal exudate.  Dry mucous membranes.  Eyes: Conjunctivae and EOM are normal. Pupils are equal, round, and reactive to light.  Neck: Normal range of motion. Neck supple.  No meningismus.  Cardiovascular: Normal rate, regular rhythm, normal heart sounds and intact distal pulses.   No murmur heard. Pulmonary/Chest: Effort normal and breath sounds normal. No respiratory distress.  Abdominal: Soft. There is tenderness. There is no rebound and no guarding.  Mild diffuse abdominal tenderness.   Musculoskeletal: Normal range of motion. She exhibits no edema or tenderness.  Neurological: She is alert. No cranial nerve deficit. She exhibits normal muscle tone. Coordination normal.  Oriented x 2. Left sided weakness at baseline per family. Left arm contracted.   Skin: Skin is warm.  Psychiatric: She has a normal mood and affect. Her behavior is normal.  Nursing note and vitals reviewed.   ED Course  Procedures (including critical care time) DIAGNOSTIC STUDIES: Oxygen Saturation is 100% on RA, normal by my interpretation.    COORDINATION OF CARE: 12:01 PM- lab results discussed with pt's family.   Labs Review Labs Reviewed  CBC WITH DIFFERENTIAL/PLATELET - Abnormal; Notable for the following:    WBC 33.3 (*)    Neutrophils Relative % 86 (*)    Neutro Abs 28.6 (*)    Lymphocytes Relative 6 (*)    Monocytes Absolute 2.8 (*)    All other components within normal limits  COMPREHENSIVE METABOLIC PANEL - Abnormal; Notable for the following:    Chloride 100 (*)    Glucose, Bld 208 (*)    BUN 30 (*)    Creatinine, Ser 2.55 (*)    Calcium 8.6 (*)    Albumin 3.3 (*)    ALT 62 (*)    GFR calc non Af Amer 16 (*)    GFR calc Af Amer 18 (*)    All other components within normal limits  URINALYSIS, ROUTINE W REFLEX MICROSCOPIC - Abnormal; Notable for the following:    APPearance HAZY (*)    Specific Gravity, Urine >1.030 (*)    Hgb urine dipstick TRACE (*)    Bilirubin Urine SMALL (*)     Ketones, ur TRACE (*)    Protein, ur >300 (*)    Leukocytes, UA TRACE (*)    All other components within normal limits  URINE MICROSCOPIC-ADD ON - Abnormal; Notable for the following:    Bacteria, UA MANY (*)    All other components within normal limits  GLUCOSE, CAPILLARY - Abnormal; Notable for the following:    Glucose-Capillary 161 (*)    All other components within normal limits  I-STAT CG4 LACTIC ACID, ED - Abnormal; Notable for the following:    Lactic Acid, Venous 2.96 (*)  All other components within normal limits  CULTURE, BLOOD (ROUTINE X 2)  MRSA PCR SCREENING  CULTURE, BLOOD (ROUTINE X 2)  URINE CULTURE  TROPONIN I  TSH  CBC WITH DIFFERENTIAL/PLATELET  PROTIME-INR  APTT  TROPONIN I  LIPASE, BLOOD  LACTIC ACID, PLASMA  LACTIC ACID, PLASMA  TROPONIN I  I-STAT CG4 LACTIC ACID, ED    Imaging Review Ct Abdomen Pelvis Wo Contrast  08/25/2014   CLINICAL DATA:  Fever and weakness  EXAM: CT ABDOMEN AND PELVIS WITHOUT CONTRAST  TECHNIQUE: Multidetector CT imaging of the abdomen and pelvis was performed following the standard protocol without IV contrast.  COMPARISON:  None.  FINDINGS: Lung bases are free of acute infiltrate or sizable effusion. A large hiatal hernia is noted.  The liver, spleen, adrenal glands and pancreas are within normal limits. The gallbladder is well distended demonstrates significant wall thickening with pericholecystic inflammatory changes and small dependent gallstones. These changes are suggestive of acute cholecystitis in the appropriate clinical setting. Nonobstructing renal calculi are noted. A large right renal cyst is seen measuring 4.5 cm.  The colon demonstrates diffuse diverticular change. No diverticulitis or abscess is identified. The bladder is well distended. The uterus has been surgically removed. The bony structures are within normal limits. There are changes consistent with an IVC filter.  IMPRESSION: Changes suggestive of acute  cholecystitis with gallstones. Ultrasound may be helpful for further evaluation. Correlation with physical exam is recommended as well.  Right renal cyst.  No other focal abnormality is seen.   Electronically Signed   By: Inez Catalina M.D.   On: 08/25/2014 12:54   Dg Chest 1 View  08/25/2014   CLINICAL DATA:  Weakness  EXAM: CHEST  1 VIEW  COMPARISON:  03/11/2014  FINDINGS: Cardiac shadow is mildly enlarged but stable. Diffuse interstitial changes are again identified and stable from the prior exam. Multiple old left rib fractures are again seen. The previously noted hiatal hernia is less well appreciated on the current exam.  IMPRESSION: No acute abnormality noted.   Electronically Signed   By: Inez Catalina M.D.   On: 08/25/2014 11:41   Ct Head Wo Contrast  08/25/2014   CLINICAL DATA:  Recent change in mental status with weakness  EXAM: CT HEAD WITHOUT CONTRAST  TECHNIQUE: Contiguous axial images were obtained from the base of the skull through the vertex without intravenous contrast.  COMPARISON:  03/11/2014  FINDINGS: Bony calvarium again demonstrates postoperative changes on the right. Diffuse atrophic changes are noted. Some areas of encephalomalacia related to the previous surgery are noted. Chronic white matter ischemic changes noted as well. No findings to suggest acute hemorrhage, acute infarction or space-occupying mass lesion are noted. An old lacunar infarct is noted within the basal ganglia on the left.  IMPRESSION: Chronic atrophic and ischemic changes.  No acute abnormality noted.   Electronically Signed   By: Inez Catalina M.D.   On: 08/25/2014 11:34   US Abdomen Complete  08/25/2014   CLINICAL DATA:  81 year old female with a history of fever and weakness. CT of the same date demonstrates changes of potential acute cholecystitis.  EXAM: ULTRASOUND ABDOMEN COMPLETE  COMPARISON:  CT 08/25/2014  FINDINGS: Gallbladder: Gallbladder distended with thickened wall measuring greater than 5 mm.  Echogenic material at the fundus of the gallbladder layer dependently. No pericholecystic fluid. Sonographic Percell Miller sign is reported negative.  Common bile duct: Diameter: 3.1 mm  Liver: No focal lesion identified. Within normal limits in parenchymal echogenicity.  IVC:  No abnormality visualized.  Pancreas: Visualized portion unremarkable.  Spleen: Size and appearance within normal limits.  Right Kidney: Length: 10.2 cm. Echogenicity of the right kidney similar to that of the adjacent liver. No hydronephrosis. Anechoic cystic structure measuring 5.2 cm x 4.4 cm x 5.0 cm, compatible with Bosniak 1 cyst. Nonobstructive stone which is present on the comparison CT is not visualized  Left Kidney: Length: 8.0 cm. Echogenicity of the left kidney similar to that of the adjacent spleen. No evidence of hydronephrosis.  Abdominal aorta: No aneurysm visualized.  Other findings: None.  IMPRESSION: Sonographic findings of the gallbladder are suspicious for acute cholecystitis, however are equivocal, as the sonographic Percell Miller sign is reportedly negative. There is suspicious gallbladder wall thickening and echogenic debris/stones within the gallbladder. If there is need to evaluate for ductal obstruction, nuclear medicine HIDA study would be recommended.  Signed,  Dulcy Fanny. Earleen Newport, DO  [facility]  [facility]   Electronically Signed   By: Corrie Mckusick D.O.   On: 08/25/2014 15:33     EKG Interpretation None      MDM   Final diagnoses:  Sepsis, due to unspecified organism  Urinary tract infection without hematuria, site unspecified   Patient from home with decreased mental status, not acting like herself, decreased activity. Temp 100.8 on arrival. Chronic left-sided weakness.  Elevated lactate and WBC, consistent with sepsis.  UA appears infected.  Blood cultures obtained.  Antibiotics started.    Patient with new renal failure as well.  Continue IVF CT head  negative, chest xray negative.  Patient with diffuse abdominal pain on exam and elevated WBC at 33. CT will be obtained to further evaluate for intraabdominal sources of sepsis.  Addendum: CT concerning for choleycystitis.  Hospitalist team aware. Antibiotics escalated to zosyn. D/w Dr. Arnoldo Morale who will consult.  ED ECG REPORT   Date: 08/25/2014  Rate: 83  Rhythm: normal sinus rhythm  QRS Axis: normal  Intervals: normal  ST/T Wave abnormalities: nonspecific ST/T changes  Conduction Disutrbances:none  Narrative Interpretation: Chronic lateral T wave inversions  Old EKG Reviewed: unchanged  I have personally reviewed the EKG tracing and agree with the computerized printout as noted.  CRITICAL CARE Performed by: Ezequiel Essex Total critical care time: *35 Critical care time was exclusive of separately billable procedures and treating other patients. Critical care was necessary to treat or prevent imminent or life-threatening deterioration. Critical care was time spent personally by me on the following activities: development of treatment plan with patient and/or surrogate as well as nursing, discussions with consultants, evaluation of patient's response to treatment, examination of patient, obtaining history from patient or surrogate, ordering and performing treatments and interventions, ordering and review of laboratory studies, ordering and review of radiographic studies, pulse oximetry and re-evaluation of patient's condition.      I personally performed the services described in this documentation, which was scribed in my presence. The recorded information has been reviewed and is accurate.    Ezequiel Essex, MD 08/25/14 1610  Ezequiel Essex, MD 08/25/14 [phone]

## 2014-08-25 NOTE — ED Notes (Signed)
MD at bedside, Dr Earlie Counts.

## 2014-08-25 NOTE — ED Notes (Signed)
MD at bedside. 

## 2014-08-25 NOTE — ED Notes (Signed)
Lab tech only able to obtain one blood culture.

## 2014-08-25 NOTE — Progress Notes (Signed)
[place] for Keppra Indication: Seizure Disorder  Allergies  Allergen Reactions  . Codeine Other (See Comments)    Unknown reaction    Patient Measurements: Height: 5\' 4"  (162.6 cm) Weight: 143 lb (64.864 kg) IBW/kg (Calculated) : 54.7  Vital Signs: Temp: 100.8 F (38.2 C) (05/16 1016) Temp Source: Rectal (05/16 1016) BP: 154/62 mmHg (05/16 1400) Pulse Rate: 89 (05/16 1400) Intake/Output from previous day:   Intake/Output from this shift:    Labs:  Recent Labs  08/25/14 1005  WBC 33.3*  HGB 12.3  HCT 37.8  PLT 265  CREATININE 2.55*  ALBUMIN 3.3*  PROT 7.2  AST 36  ALT 62*  ALKPHOS 69  BILITOT 0.7   Estimated Creatinine Clearance: 13.4 mL/min (by C-G formula based on Cr of 2.55).   Microbiology: No results found for this or any previous visit (from the past 720 hour(s)).  Medications:  Prescriptions prior to admission  Medication Sig Dispense Refill Last Dose  . amLODipine (NORVASC) 10 MG tablet Take 10 mg by mouth daily.   08/25/2014 at Unknown time  . aspirin EC 81 MG tablet Take 81 mg by mouth daily.   08/25/2014 at Unknown time  . atenolol (TENORMIN) 50 MG tablet Take 50 mg by mouth daily.   08/25/2014 at 0850  . Calcium Carbonate-Vitamin D (CALCARB 600/D PO) Take 1 tablet by mouth 2 (two) times daily.   08/25/2014 at Unknown time  . carbamazepine (TEGRETOL) 200 MG tablet Take 200 mg by mouth 3 (three) times daily.   08/25/2014 at Unknown time  . donepezil (ARICEPT) 10 MG tablet Take 10 mg by mouth daily.   08/25/2014 at Unknown time  . escitalopram (LEXAPRO) 10 MG tablet Take 10 mg by mouth daily.   08/25/2014 at Unknown time  . Fe Fum-FA-B Cmp-C-Zn-Mg-Mn-Cu (HEMATINIC PLUS COMPLEX) 106-1 MG TABS Take 1 tablet by mouth daily.   08/25/2014 at Unknown time  . glimepiride (AMARYL) 4 MG tablet Take 4 mg by mouth daily with breakfast.   08/25/2014 at Unknown time  . levothyroxine (SYNTHROID, LEVOTHROID) 50 MCG tablet Take  50 mcg by mouth daily before breakfast.   08/25/2014 at Unknown time  . losartan (COZAAR) 100 MG tablet Take 100 mg by mouth daily.   08/25/2014 at Unknown time  . meclizine (ANTIVERT) 25 MG tablet Take 25 mg by mouth daily as needed for dizziness.   Past Month at Unknown time  . memantine (NAMENDA) 10 MG tablet Take 10 mg by mouth 2 (two) times daily.   08/25/2014 at Unknown time  . pantoprazole (PROTONIX) 20 MG tablet Take 20 mg by mouth daily.   08/25/2014 at Unknown time  . rosuvastatin (CRESTOR) 20 MG tablet Take 20 mg by mouth every evening.   08/24/2014 at Unknown time  . [DISCONTINUED] ciprofloxacin (CIPRO) 250 MG tablet Take 1 tablet (250 mg total) by mouth 2 (two) times daily. (Patient not taking: Reported on 08/25/2014) 10 tablet 0 Not Taking at Unknown time  . [DISCONTINUED] hydrALAZINE (APRESOLINE) 25 MG tablet Take 1 tablet (25 mg total) by mouth 3 (three) times daily. (Patient not taking: Reported on 08/25/2014) 90 tablet 1 Not Taking at Unknown time    Assessment: Okay for Protocol, Patient takes Tegretol @ home but is currently not able to take PO Meds.  Goal of Therapy:  Seizure control  Plan:  Keppra 500mg  IV BID Change back to PO Tegretol when able.  Pricilla Larsson 08/25/2014,3:43 PM

## 2014-08-26 LAB — GLUCOSE, CAPILLARY
GLUCOSE-CAPILLARY: 103 mg/dL — AB (ref 65–99)
Glucose-Capillary: 113 mg/dL — ABNORMAL HIGH (ref 65–99)
Glucose-Capillary: 111 mg/dL — ABNORMAL HIGH (ref 65–99)
Glucose-Capillary: 106 mg/dL — ABNORMAL HIGH (ref 65–99)

## 2014-08-26 LAB — CBC
HEMATOCRIT: 28.8 % — AB (ref 36.0–46.0)
Hemoglobin: 9.4 g/dL — ABNORMAL LOW (ref 12.0–15.0)
MCH: 31.4 pg (ref 26.0–34.0)
MCHC: 32.6 g/dL (ref 30.0–36.0)
MCV: 96.3 fL (ref 78.0–100.0)
PLATELETS: 184 10*3/uL (ref 150–400)
RBC: 2.99 MIL/uL — ABNORMAL LOW (ref 3.87–5.11)
RDW: 14.5 % (ref 11.5–15.5)
WBC: 25.4 10*3/uL — AB (ref 4.0–10.5)

## 2014-08-26 LAB — COMPREHENSIVE METABOLIC PANEL
ALK PHOS: 87 U/L (ref 38–126)
ALT: 54 U/L (ref 14–54)
AST: 42 U/L — AB (ref 15–41)
Albumin: 2.4 g/dL — ABNORMAL LOW (ref 3.5–5.0)
Anion gap: 7 (ref 5–15)
BUN: 31 mg/dL — ABNORMAL HIGH (ref 6–20)
CALCIUM: 6.9 mg/dL — AB (ref 8.9–10.3)
CO2: 24 mmol/L (ref 22–32)
Chloride: 112 mmol/L — ABNORMAL HIGH (ref 101–111)
Creatinine, Ser: 1.94 mg/dL — ABNORMAL HIGH (ref 0.44–1.00)
GFR calc non Af Amer: 22 mL/min — ABNORMAL LOW (ref >60)
GFR, EST AFRICAN AMERICAN: 26 mL/min — AB (ref >60)
Glucose, Bld: 100 mg/dL — ABNORMAL HIGH (ref 65–99)
Potassium: 3.7 mmol/L (ref 3.5–5.1)
SODIUM: 143 mmol/L (ref 135–145)
TOTAL PROTEIN: 5.6 g/dL — AB (ref 6.5–8.1)
Total Bilirubin: 0.4 mg/dL (ref 0.3–1.2)

## 2014-08-26 LAB — LACTIC ACID, PLASMA: Lactic Acid, Venous: 1.3 mmol/L (ref 0.5–2.0)

## 2014-08-26 LAB — TROPONIN I: Troponin I: 0.05 ng/mL — ABNORMAL HIGH (ref <0.031)

## 2014-08-26 MED ORDER — SODIUM CHLORIDE 0.9 % IV SOLN
1.5000 g | Freq: Two times a day (BID) | INTRAVENOUS | Status: DC
  Administered 2014-08-26 – 2014-08-28 (×4): 1.5 g via INTRAVENOUS
  Filled 2014-08-26 (×7): qty 1.5

## 2014-08-26 MED ORDER — ACETAMINOPHEN 650 MG RE SUPP
650.0000 mg | RECTAL | Status: DC | PRN
  Administered 2014-08-26 – 2014-09-03 (×3): 650 mg via RECTAL
  Filled 2014-08-26 (×3): qty 1

## 2014-08-26 MED ORDER — METOPROLOL TARTRATE 1 MG/ML IV SOLN
5.0000 mg | Freq: Four times a day (QID) | INTRAVENOUS | Status: DC
  Administered 2014-08-26 – 2014-09-08 (×52): 5 mg via INTRAVENOUS
  Filled 2014-08-26 (×52): qty 5

## 2014-08-26 MED ORDER — HYDRALAZINE HCL 20 MG/ML IJ SOLN
5.0000 mg | INTRAMUSCULAR | Status: DC | PRN
  Administered 2014-08-27 – 2014-08-28 (×3): 5 mg via INTRAVENOUS
  Filled 2014-08-26 (×3): qty 1

## 2014-08-26 MED ORDER — DEXTROSE-NACL 5-0.9 % IV SOLN
INTRAVENOUS | Status: DC
  Administered 2014-08-26 – 2014-08-27 (×3): via INTRAVENOUS

## 2014-08-26 MED ORDER — VANCOMYCIN HCL IN DEXTROSE 1-5 GM/200ML-% IV SOLN
1000.0000 mg | INTRAVENOUS | Status: DC
  Administered 2014-08-26 – 2014-08-28 (×2): 1000 mg via INTRAVENOUS
  Filled 2014-08-26 (×4): qty 200

## 2014-08-26 NOTE — Consult Note (Signed)
Reason for Consult: Sepsis, question cholecystitis Referring Physician: Hospitalist  Tina Hunter is an 81 y.o. female.  HPI: Tina Hunter is an 81 year old black female who presented to [facility] with worsening fever, chills, and decreased mental status. Sepsis workup revealed probable urosepsis, though an ultrasound the gallbladder revealed a thickened wall and sludge present. The common bile duct was within normal limits. There was no pericholecystic fluid. There was a negative Murphy's sign. She had a significant leukocytosis and has been admitted to the ICU for further evaluation and treatment for presumed urosepsis. Tina Hunter is not easily arousable. I have examined her twice today and there was no right upper quadrant abdominal pain. I did talk with family who stated that she has seen a Psychologist, sport and exercise in [place] in the past for cholelithiasis and he did not recommend surgery at that time.  Past Medical History  Diagnosis Date  . Stroke   . Diabetes mellitus without complication   . Hypertension   . High cholesterol   . Brain tumor (benign)   . Chronic bronchitis   . Seizures     Past Surgical History  Procedure Laterality Date  . Brain surgery    . Abdominal hysterectomy      History reviewed. No pertinent family history.  Social History:  reports that she has never smoked. She does not have any smokeless tobacco history on file. She reports that she does not drink alcohol or use illicit drugs.  Allergies:  Allergies  Allergen Reactions  . Codeine Other (See Comments)    Unknown reaction    Medications: I have reviewed the Tina Hunter's current medications.  Results for orders placed or performed during the hospital encounter of 08/25/14 (from the past 48 hour(s))  CBC with Differential/Platelet     Status: Abnormal   Collection Time: 08/25/14 10:05 AM  Result Value Ref Range   WBC 33.3 (H) 4.0 - 10.5 K/uL   RBC 3.91 3.87 - 5.11 MIL/uL   Hemoglobin 12.3 12.0 - 15.0  g/dL   HCT 37.8 36.0 - 46.0 %   MCV 96.7 78.0 - 100.0 fL   MCH 31.5 26.0 - 34.0 pg   MCHC 32.5 30.0 - 36.0 g/dL   RDW 13.9 11.5 - 15.5 %   Platelets 265 150 - 400 K/uL   Neutrophils Relative % 86 (H) 43 - 77 %   Neutro Abs 28.6 (H) 1.7 - 7.7 K/uL   Lymphocytes Relative 6 (L) 12 - 46 %   Lymphs Abs 1.8 0.7 - 4.0 K/uL   Monocytes Relative 9 3 - 12 %   Monocytes Absolute 2.8 (H) 0.1 - 1.0 K/uL   Eosinophils Relative 0 0 - 5 %   Eosinophils Absolute 0.0 0.0 - 0.7 K/uL   Basophils Relative 0 0 - 1 %   Basophils Absolute 0.0 0.0 - 0.1 K/uL  Comprehensive metabolic panel     Status: Abnormal   Collection Time: 08/25/14 10:05 AM  Result Value Ref Range   Sodium 141 135 - 145 mmol/L   Potassium 4.1 3.5 - 5.1 mmol/L   Chloride 100 (L) 101 - 111 mmol/L   CO2 29 22 - 32 mmol/L   Glucose, Bld 208 (H) 65 - 99 mg/dL   BUN 30 (H) 6 - 20 mg/dL   Creatinine, Ser 2.55 (H) 0.44 - 1.00 mg/dL   Calcium 8.6 (L) 8.9 - 10.3 mg/dL   Total Protein 7.2 6.5 - 8.1 g/dL   Albumin 3.3 (L) 3.5 - 5.0 g/dL  AST 36 15 - 41 U/L   ALT 62 (H) 14 - 54 U/L   Alkaline Phosphatase 69 38 - 126 U/L   Total Bilirubin 0.7 0.3 - 1.2 mg/dL   GFR calc non Af Amer 16 (L) >60 mL/min   GFR calc Af Amer 18 (L) >60 mL/min    Comment: (NOTE) The eGFR has been calculated using the CKD EPI equation. This calculation has not been validated in all clinical situations. eGFR's persistently <60 mL/min signify possible Chronic Kidney Disease.    Anion gap 12 5 - 15  Troponin I     Status: None   Collection Time: 08/25/14 10:05 AM  Result Value Ref Range   Troponin I <0.03 <0.031 ng/mL    Comment:        NO INDICATION OF MYOCARDIAL INJURY.   TSH     Status: None   Collection Time: 08/25/14 10:08 AM  Result Value Ref Range   TSH 1.403 0.350 - 4.500 uIU/mL  Urinalysis, Routine w reflex microscopic     Status: Abnormal   Collection Time: 08/25/14 10:20 AM  Result Value Ref Range   Color, Urine YELLOW YELLOW   APPearance  HAZY (A) CLEAR   Specific Gravity, Urine >1.030 (H) 1.005 - 1.030   pH 5.0 5.0 - 8.0   Glucose, UA NEGATIVE NEGATIVE mg/dL   Hgb urine dipstick TRACE (A) NEGATIVE   Bilirubin Urine SMALL (A) NEGATIVE   Ketones, ur TRACE (A) NEGATIVE mg/dL   Protein, ur >300 (A) NEGATIVE mg/dL   Urobilinogen, UA 0.2 0.0 - 1.0 mg/dL   Nitrite NEGATIVE NEGATIVE   Leukocytes, UA TRACE (A) NEGATIVE  Urine microscopic-add on     Status: Abnormal   Collection Time: 08/25/14 10:20 AM  Result Value Ref Range   WBC, UA TOO NUMEROUS TO COUNT <3 WBC/hpf   RBC / HPF 3-6 <3 RBC/hpf   Bacteria, UA MANY (A) RARE  I-Stat CG4 Lactic Acid, ED     Status: Abnormal   Collection Time: 08/25/14 10:24 AM  Result Value Ref Range   Lactic Acid, Venous 2.96 (HH) 0.5 - 2.0 mmol/L   Comment NOTIFIED PHYSICIAN   Blood culture (routine x 2)     Status: None (Preliminary result)   Collection Time: 08/25/14 11:09 AM  Result Value Ref Range   Specimen Description BLOOD RIGHT HAND    Special Requests BOTTLES DRAWN AEROBIC ONLY 4CC    Culture NO GROWTH 1 DAY    Report Status PENDING   MRSA PCR Screening     Status: None   Collection Time: 08/25/14  1:20 PM  Result Value Ref Range   MRSA by PCR NEGATIVE NEGATIVE    Comment:        The GeneXpert MRSA Assay (FDA approved for NASAL specimens only), is one component of a comprehensive MRSA colonization surveillance program. It is not intended to diagnose MRSA infection nor to guide or monitor treatment for MRSA infections.   Glucose, capillary     Status: Abnormal   Collection Time: 08/25/14  5:17 PM  Result Value Ref Range   Glucose-Capillary 161 (H) 65 - 99 mg/dL   Comment 1 Notify RN    Comment 2 Document in Chart   Lactic acid, plasma     Status: None   Collection Time: 08/25/14  5:51 PM  Result Value Ref Range   Lactic Acid, Venous 1.1 0.5 - 2.0 mmol/L  Troponin I     Status: None   Collection Time:  08/25/14  6:13 PM  Result Value Ref Range   Troponin I 0.03  <0.031 ng/mL    Comment:        NO INDICATION OF MYOCARDIAL INJURY.   Culture, blood (x 2)     Status: None (Preliminary result)   Collection Time: 08/25/14  8:00 PM  Result Value Ref Range   Specimen Description BLOOD PICC LINE DRAWN BY RN RL    Special Requests BOTTLES DRAWN AEROBIC AND ANAEROBIC 6CC    Culture      GRAM POSITIVE COCCI IN PAIRS Gram Stain Report Called to,Read Back By and Verified With: [place]. AT 1450 ON 08/26/2014 BY BAUGHAM,M. Performed at [facility]    Report Status PENDING   Lactic acid, plasma     Status: Abnormal   Collection Time: 08/25/14  8:00 PM  Result Value Ref Range   Lactic Acid, Venous 3.5 (HH) 0.5 - 2.0 mmol/L    Comment: RESULT REPEATED AND VERIFIED CRITICAL RESULT CALLED TO, READ BACK BY AND VERIFIED WITH: MCLEOD H AT 2037 ON [id number] BY FORSYTH K   Protime-INR     Status: Abnormal   Collection Time: 08/25/14  8:00 PM  Result Value Ref Range   Prothrombin Time 17.4 (H) 11.6 - 15.2 seconds   INR 1.41 0.00 - 1.49  APTT     Status: None   Collection Time: 08/25/14  8:00 PM  Result Value Ref Range   aPTT 32 24 - 37 seconds  CBC with Differential/Platelet     Status: Abnormal   Collection Time: 08/25/14  8:00 PM  Result Value Ref Range   WBC 13.0 (H) 4.0 - 10.5 K/uL   RBC 3.36 (L) 3.87 - 5.11 MIL/uL   Hemoglobin 10.6 (L) 12.0 - 15.0 g/dL   HCT 32.1 (L) 36.0 - 46.0 %   MCV 95.5 78.0 - 100.0 fL   MCH 31.5 26.0 - 34.0 pg   MCHC 33.0 30.0 - 36.0 g/dL   RDW 14.0 11.5 - 15.5 %   Platelets 197 150 - 400 K/uL   Neutrophils Relative % 98 (H) 43 - 77 %   Neutro Abs 12.7 (H) 1.7 - 7.7 K/uL   Lymphocytes Relative 2 (L) 12 - 46 %   Lymphs Abs 0.3 (L) 0.7 - 4.0 K/uL   Monocytes Relative 0 (L) 3 - 12 %   Monocytes Absolute 0.0 (L) 0.1 - 1.0 K/uL   Eosinophils Relative 0 0 - 5 %   Eosinophils Absolute 0.0 0.0 - 0.7 K/uL   Basophils Relative 0 0 - 1 %   Basophils Absolute 0.0 0.0 - 0.1 K/uL  Glucose, capillary     Status: Abnormal    Collection Time: 08/25/14  9:20 PM  Result Value Ref Range   Glucose-Capillary 117 (H) 65 - 99 mg/dL  Lactic acid, plasma     Status: None   Collection Time: 08/26/14 12:33 AM  Result Value Ref Range   Lactic Acid, Venous 1.3 0.5 - 2.0 mmol/L  Troponin I     Status: Abnormal   Collection Time: 08/26/14  4:25 AM  Result Value Ref Range   Troponin I 0.05 (H) <0.031 ng/mL    Comment:        PERSISTENTLY INCREASED TROPONIN VALUES IN THE RANGE OF 0.04-0.49 ng/mL CAN BE SEEN IN:       -UNSTABLE ANGINA       -CONGESTIVE HEART FAILURE       -MYOCARDITIS       -CHEST  TRAUMA       -ARRYHTHMIAS       -LATE PRESENTING MYOCARDIAL INFARCTION       -COPD   CLINICAL FOLLOW-UP RECOMMENDED.   Glucose, capillary     Status: Abnormal   Collection Time: 08/26/14  7:28 AM  Result Value Ref Range   Glucose-Capillary 113 (H) 65 - 99 mg/dL   Comment 1 Notify RN    Comment 2 Document in Chart   Comprehensive metabolic panel     Status: Abnormal   Collection Time: 08/26/14 10:30 AM  Result Value Ref Range   Sodium 143 135 - 145 mmol/L   Potassium 3.7 3.5 - 5.1 mmol/L   Chloride 112 (H) 101 - 111 mmol/L   CO2 24 22 - 32 mmol/L   Glucose, Bld 100 (H) 65 - 99 mg/dL   BUN 31 (H) 6 - 20 mg/dL   Creatinine, Ser 1.94 (H) 0.44 - 1.00 mg/dL   Calcium 6.9 (L) 8.9 - 10.3 mg/dL   Total Protein 5.6 (L) 6.5 - 8.1 g/dL   Albumin 2.4 (L) 3.5 - 5.0 g/dL   AST 42 (H) 15 - 41 U/L   ALT 54 14 - 54 U/L   Alkaline Phosphatase 87 38 - 126 U/L   Total Bilirubin 0.4 0.3 - 1.2 mg/dL   GFR calc non Af Amer 22 (L) >60 mL/min   GFR calc Af Amer 26 (L) >60 mL/min    Comment: (NOTE) The eGFR has been calculated using the CKD EPI equation. This calculation has not been validated in all clinical situations. eGFR's persistently <60 mL/min signify possible Chronic Kidney Disease.    Anion gap 7 5 - 15  CBC     Status: Abnormal   Collection Time: 08/26/14 10:30 AM  Result Value Ref Range   WBC 25.4 (H) 4.0 - 10.5 K/uL    RBC 2.99 (L) 3.87 - 5.11 MIL/uL   Hemoglobin 9.4 (L) 12.0 - 15.0 g/dL   HCT 28.8 (L) 36.0 - 46.0 %   MCV 96.3 78.0 - 100.0 fL   MCH 31.4 26.0 - 34.0 pg   MCHC 32.6 30.0 - 36.0 g/dL   RDW 14.5 11.5 - 15.5 %   Platelets 184 150 - 400 K/uL  Glucose, capillary     Status: Abnormal   Collection Time: 08/26/14  4:31 PM  Result Value Ref Range   Glucose-Capillary 111 (H) 65 - 99 mg/dL   Comment 1 Notify RN    Comment 2 Document in Chart     Ct Abdomen Pelvis Wo Contrast  08/25/2014   CLINICAL DATA:  Fever and weakness  EXAM: CT ABDOMEN AND PELVIS WITHOUT CONTRAST  TECHNIQUE: Multidetector CT imaging of the abdomen and pelvis was performed following the standard protocol without IV contrast.  COMPARISON:  None.  FINDINGS: Lung bases are free of acute infiltrate or sizable effusion. A large hiatal hernia is noted.  The liver, spleen, adrenal glands and pancreas are within normal limits. The gallbladder is well distended demonstrates significant wall thickening with pericholecystic inflammatory changes and small dependent gallstones. These changes are suggestive of acute cholecystitis in the appropriate clinical setting. Nonobstructing renal calculi are noted. A large right renal cyst is seen measuring 4.5 cm.  The colon demonstrates diffuse diverticular change. No diverticulitis or abscess is identified. The bladder is well distended. The uterus has been surgically removed. The bony structures are within normal limits. There are changes consistent with an IVC filter.  IMPRESSION: Changes suggestive of acute cholecystitis  with gallstones. Ultrasound may be helpful for further evaluation. Correlation with physical exam is recommended as well.  Right renal cyst.  No other focal abnormality is seen.   Electronically Signed   By: Inez Catalina M.D.   On: 08/25/2014 12:54   Dg Chest 1 View  08/25/2014   CLINICAL DATA:  Weakness  EXAM: CHEST  1 VIEW  COMPARISON:  03/11/2014  FINDINGS: Cardiac shadow is mildly  enlarged but stable. Diffuse interstitial changes are again identified and stable from the prior exam. Multiple old left rib fractures are again seen. The previously noted hiatal hernia is less well appreciated on the current exam.  IMPRESSION: No acute abnormality noted.   Electronically Signed   By: Inez Catalina M.D.   On: 08/25/2014 11:41   Ct Head Wo Contrast  08/25/2014   CLINICAL DATA:  Recent change in mental status with weakness  EXAM: CT HEAD WITHOUT CONTRAST  TECHNIQUE: Contiguous axial images were obtained from the base of the skull through the vertex without intravenous contrast.  COMPARISON:  03/11/2014  FINDINGS: Bony calvarium again demonstrates postoperative changes on the right. Diffuse atrophic changes are noted. Some areas of encephalomalacia related to the previous surgery are noted. Chronic white matter ischemic changes noted as well. No findings to suggest acute hemorrhage, acute infarction or space-occupying mass lesion are noted. An old lacunar infarct is noted within the basal ganglia on the left.  IMPRESSION: Chronic atrophic and ischemic changes.  No acute abnormality noted.   Electronically Signed   By: Inez Catalina M.D.   On: 08/25/2014 11:34   US Abdomen Complete  08/25/2014   CLINICAL DATA:  81 year old female with a history of fever and weakness. CT of the same date demonstrates changes of potential acute cholecystitis.  EXAM: ULTRASOUND ABDOMEN COMPLETE  COMPARISON:  CT 08/25/2014  FINDINGS: Gallbladder: Gallbladder distended with thickened wall measuring greater than 5 mm. Echogenic material at the fundus of the gallbladder layer dependently. No pericholecystic fluid. Sonographic Percell Miller sign is reported negative.  Common bile duct: Diameter: 3.1 mm  Liver: No focal lesion identified. Within normal limits in parenchymal echogenicity.  IVC: No abnormality visualized.  Pancreas: Visualized portion unremarkable.  Spleen: Size and appearance within normal limits.  Right Kidney:  Length: 10.2 cm. Echogenicity of the right kidney similar to that of the adjacent liver. No hydronephrosis. Anechoic cystic structure measuring 5.2 cm x 4.4 cm x 5.0 cm, compatible with Bosniak 1 cyst. Nonobstructive stone which is present on the comparison CT is not visualized  Left Kidney: Length: 8.0 cm. Echogenicity of the left kidney similar to that of the adjacent spleen. No evidence of hydronephrosis.  Abdominal aorta: No aneurysm visualized.  Other findings: None.  IMPRESSION: Sonographic findings of the gallbladder are suspicious for acute cholecystitis, however are equivocal, as the sonographic Percell Miller sign is reportedly negative. There is suspicious gallbladder wall thickening and echogenic debris/stones within the gallbladder. If there is need to evaluate for ductal obstruction, nuclear medicine HIDA study would be recommended.  Signed,  Dulcy Fanny. Earleen Newport, DO  [facility]  [facility]   Electronically Signed   By: Corrie Mckusick D.O.   On: 08/25/2014 15:33    ROS: See chart Blood pressure 138/53, pulse 87, temperature 102.6 F (39.2 C), temperature source Axillary, resp. rate 26, height 5' 4"  (1.626 m), weight 64.864 kg (143 lb), SpO2 99 %. Physical Exam: 46 black female in no acute distress. Abdomen is soft, nontender, nondistended. No hepatosplenomegaly or  masses noted. No rigidity is noted.  Assessment/Plan: Impression: Cholelithiasis, urosepsis.  At this time, cholecystitis is less likely, though I think we should keep it in the back of our minds. She could develop gangrenous cholecystitis while in the ICU for treatment for her urosepsis. I have noted that her troponins have increased, although this may be stress-induced. She is not a good candidate for the OR at this time. This was explained to the family, who understand and agree. That her liver enzyme tests are within normal limits is reassuring. We'll follow closely with you.    Dyrell Tuccillo A 08/26/2014, 5:50 PM

## 2014-08-26 NOTE — Consult Note (Signed)
Consult noted. Full note to follow pending lab results.

## 2014-08-26 NOTE — Care Management Note (Signed)
Case Management Note  Patient Details  Name: Kerry Odonohue MRN: [id number] Date of Birth: [birth year]  Expected Discharge Date:                  Expected Discharge Plan:  [place]  In-House Referral:  NA  Discharge planning Services  CM Consult  Post Acute Care Choice:  Durable Medical Equipment, Home Health Choice offered to:  Patient  DME Arranged:    DME Agency:  [facility]  HH Arranged:    [facility] Agency:  [facility]  Status of Service:  In process, will continue to follow  Medicare Important Message Given:    Date Medicare IM Given:    Medicare IM give by:    Date Additional Medicare IM Given:    Additional Medicare Important Message give by:     If discussed at [place] of Stay Meetings, dates discussed:    Additional Comments: Pt is from home, lives with daughter Vaughan Basta). Pt was unarrousable for assessment, daughter Hoyle Sauer) in room and provided information. Pt lives in [place] and has walker, wheelchair, neb and home O2 through [facility]. Pt has used [employer] services in the past. Pt has also been to [facility] in the past. Pt is admitted with sepsis and will need PT eval prior to admission. Pt previously able to complete ADL's with moderate assistance, she could ambulate and feed herself but needed assistance with bathing and preparing food. Will cont to follow for CM needs.  Sherald Barge, RN 08/26/2014, 2:35 PM

## 2014-08-26 NOTE — Progress Notes (Signed)
Peripherally Inserted Central Catheter/Midline Placement  The IV Nurse has discussed with the patient and/or persons authorized to consent for the patient, the purpose of this procedure and the potential benefits and risks involved with this procedure.  The benefits include less needle sticks, lab draws from the catheter and patient may be discharged home with the catheter.  Risks include, but not limited to, infection, bleeding, blood clot (thrombus formation), and puncture of an artery; nerve damage and irregular heat beat.  Alternatives to this procedure were also discussed.  PICC/Midline Placement Documentation  PICC / Midline Double Lumen 08/25/14 PICC Right Brachial 36 cm 4 cm (Active)    picc line ready to use per ECG technology   Roselind Messier 08/26/2014, 10:53 AM

## 2014-08-26 NOTE — Progress Notes (Signed)
PT Cancellation Note  Patient Details Name: Tina Hunter MRN: [id number] DOB: 09-18-1924   Cancelled Treatment:    Reason Eval/Treat Not Completed: Patient not medically ready.  Please reconsult when PT is appropriate.   Sable Feil 08/26/2014, 2:31 PM

## 2014-08-26 NOTE — Progress Notes (Signed)
[facility] PROGRESS NOTE  Tina Hunter WHQ:[id number] DOB: [birth year] DOA: 08/25/2014 PCP: No primary care provider on file.  Assessment/Plan: 1. Severe sepsis likely from UTI vs less likely acute cholecystitis 1. UA strongly suspicious for UTI 2. CT abd with evidence suggestive of acute cholecystits, as was abd [place]. General surgery was consulted. Dictated recommendations done, awaiting upload for review. LFT's otherwise unremarkable 3. Pt initially continued on rocephin with improvement in WBC overnight, but WBC increased to 25k 4. Patient remains lethargic today 5. Will transition abx to empiric vancomycin and unasyn to broaden coverage given severe sepsis 6. Follow pan cultures 2. ARF 1. Cr has improved overnight with IVF 2. Continue to monitor renal function 3. Dementia 1. Stable 2. Lethargic this AM, likely from sepsis 4. Hx seizures 1. Stable thus far 5. HTN 1. BP stable and controlled 2. Cont to monitor 6. Hx CVA with residual hemiparesis 1. Stable at present 7. Lactic acidosis 1. Peak lactate of 3.5 overnight, corrected to 1.3 with IVF 8. DVT prophylaxis 1. Heparin subQ  Code Status: No mechanical ventilation or CPR, but defibrillation, ACLS OK Family Communication: Pt in room, family at bedside Disposition Plan: Pending  Consultants:  General Surgery  Procedures:    Antibiotics:  Rocephin 5/16>>>5/17  Vancomycin 5/17>>>  Unasyn 5/17>>>  HPI/Subjective: Pt lethargic this AM, unable to provide subjective  Objective: Filed Vitals:   08/26/14 1100 08/26/14 1200 08/26/14 1247 08/26/14 1300  BP: 131/59 124/60  144/58  Pulse: 81 83  87  Temp:   98.7 F (37.1 C)   TempSrc:   Axillary   Resp: 20 24  26   Height:      Weight:      SpO2: 99% 99%  99%    Intake/Output Summary (Last 24 hours) at 08/26/14 1413 Last data filed at 08/26/14 1319  Gross per 24 hour  Intake 1926.67 ml  Output    275 ml  Net 1651.67 ml   Filed Weights   08/25/14  0935  Weight: 64.864 kg (143 lb)    Exam:   General:  Lethargic, arousable, in nad  Cardiovascular: regular, s1, s2  Respiratory: normal resp effort, no wheezing  Abdomen: soft,nondistended  Musculoskeletal: perfused, no clubbing   Data Reviewed: Basic Metabolic Panel:  Recent Labs Lab 08/25/14 1005 08/26/14 1030  NA 141 143  K 4.1 3.7  CL 100* 112*  CO2 29 24  GLUCOSE 208* 100*  BUN 30* 31*  CREATININE 2.55* 1.94*  CALCIUM 8.6* 6.9*   Liver Function Tests:  Recent Labs Lab 08/25/14 1005 08/26/14 1030  AST 36 42*  ALT 62* 54  ALKPHOS 69 87  BILITOT 0.7 0.4  PROT 7.2 5.6*  ALBUMIN 3.3* 2.4*   No results for input(s): LIPASE, AMYLASE in the last 168 hours. No results for input(s): AMMONIA in the last 168 hours. CBC:  Recent Labs Lab 08/25/14 1005 08/25/14 2000 08/26/14 1030  WBC 33.3* 13.0* 25.4*  NEUTROABS 28.6* 12.7*  --   HGB 12.3 10.6* 9.4*  HCT 37.8 32.1* 28.8*  MCV 96.7 95.5 96.3  PLT 265 197 184   Cardiac Enzymes:  Recent Labs Lab 08/25/14 1005 08/25/14 1813 08/26/14 0425  TROPONINI <0.03 0.03 0.05*   BNP (last 3 results) No results for input(s): BNP in the last 8760 hours.  ProBNP (last 3 results) No results for input(s): PROBNP in the last 8760 hours.  CBG:  Recent Labs Lab 08/25/14 1717 08/25/14 2120 08/26/14 0728  GLUCAP 161* 117* 113*  Recent Results (from the past 240 hour(s))  Blood culture (routine x 2)     Status: None (Preliminary result)   Collection Time: 08/25/14 11:09 AM  Result Value Ref Range Status   Specimen Description BLOOD RIGHT HAND  Final   Special Requests BOTTLES DRAWN AEROBIC ONLY 4CC  Final   Culture NO GROWTH 1 DAY  Final   Report Status PENDING  Incomplete  MRSA PCR Screening     Status: None   Collection Time: 08/25/14  1:20 PM  Result Value Ref Range Status   MRSA by PCR NEGATIVE NEGATIVE Final    Comment:        The GeneXpert MRSA Assay (FDA approved for NASAL specimens only),  is one component of a comprehensive MRSA colonization surveillance program. It is not intended to diagnose MRSA infection nor to guide or monitor treatment for MRSA infections.   Culture, blood (x 2)     Status: None (Preliminary result)   Collection Time: 08/25/14  8:00 PM  Result Value Ref Range Status   Specimen Description BLOOD PICC LINE DRAWN BY RN RL  Final   Special Requests BOTTLES DRAWN AEROBIC AND ANAEROBIC 6CC  Final   Culture NO GROWTH 1 DAY  Final   Report Status PENDING  Incomplete     Studies: Ct Abdomen Pelvis Wo Contrast  08/25/2014   CLINICAL DATA:  Fever and weakness  EXAM: CT ABDOMEN AND PELVIS WITHOUT CONTRAST  TECHNIQUE: Multidetector CT imaging of the abdomen and pelvis was performed following the standard protocol without IV contrast.  COMPARISON:  None.  FINDINGS: Lung bases are free of acute infiltrate or sizable effusion. A large hiatal hernia is noted.  The liver, spleen, adrenal glands and pancreas are within normal limits. The gallbladder is well distended demonstrates significant wall thickening with pericholecystic inflammatory changes and small dependent gallstones. These changes are suggestive of acute cholecystitis in the appropriate clinical setting. Nonobstructing renal calculi are noted. A large right renal cyst is seen measuring 4.5 cm.  The colon demonstrates diffuse diverticular change. No diverticulitis or abscess is identified. The bladder is well distended. The uterus has been surgically removed. The bony structures are within normal limits. There are changes consistent with an IVC filter.  IMPRESSION: Changes suggestive of acute cholecystitis with gallstones. Ultrasound may be helpful for further evaluation. Correlation with physical exam is recommended as well.  Right renal cyst.  No other focal abnormality is seen.   Electronically Signed   By: Inez Catalina M.D.   On: 08/25/2014 12:54   Dg Chest 1 View  08/25/2014   CLINICAL DATA:  Weakness  EXAM:  CHEST  1 VIEW  COMPARISON:  03/11/2014  FINDINGS: Cardiac shadow is mildly enlarged but stable. Diffuse interstitial changes are again identified and stable from the prior exam. Multiple old left rib fractures are again seen. The previously noted hiatal hernia is less well appreciated on the current exam.  IMPRESSION: No acute abnormality noted.   Electronically Signed   By: Inez Catalina M.D.   On: 08/25/2014 11:41   Ct Head Wo Contrast  08/25/2014   CLINICAL DATA:  Recent change in mental status with weakness  EXAM: CT HEAD WITHOUT CONTRAST  TECHNIQUE: Contiguous axial images were obtained from the base of the skull through the vertex without intravenous contrast.  COMPARISON:  03/11/2014  FINDINGS: Bony calvarium again demonstrates postoperative changes on the right. Diffuse atrophic changes are noted. Some areas of encephalomalacia related to the previous surgery are noted.  Chronic white matter ischemic changes noted as well. No findings to suggest acute hemorrhage, acute infarction or space-occupying mass lesion are noted. An old lacunar infarct is noted within the basal ganglia on the left.  IMPRESSION: Chronic atrophic and ischemic changes.  No acute abnormality noted.   Electronically Signed   By: Inez Catalina M.D.   On: 08/25/2014 11:34   US Abdomen Complete  08/25/2014   CLINICAL DATA:  81 year old female with a history of fever and weakness. CT of the same date demonstrates changes of potential acute cholecystitis.  EXAM: ULTRASOUND ABDOMEN COMPLETE  COMPARISON:  CT 08/25/2014  FINDINGS: Gallbladder: Gallbladder distended with thickened wall measuring greater than 5 mm. Echogenic material at the fundus of the gallbladder layer dependently. No pericholecystic fluid. Sonographic Percell Miller sign is reported negative.  Common bile duct: Diameter: 3.1 mm  Liver: No focal lesion identified. Within normal limits in parenchymal echogenicity.  IVC: No abnormality visualized.  Pancreas: Visualized portion  unremarkable.  Spleen: Size and appearance within normal limits.  Right Kidney: Length: 10.2 cm. Echogenicity of the right kidney similar to that of the adjacent liver. No hydronephrosis. Anechoic cystic structure measuring 5.2 cm x 4.4 cm x 5.0 cm, compatible with Bosniak 1 cyst. Nonobstructive stone which is present on the comparison CT is not visualized  Left Kidney: Length: 8.0 cm. Echogenicity of the left kidney similar to that of the adjacent spleen. No evidence of hydronephrosis.  Abdominal aorta: No aneurysm visualized.  Other findings: None.  IMPRESSION: Sonographic findings of the gallbladder are suspicious for acute cholecystitis, however are equivocal, as the sonographic Percell Miller sign is reportedly negative. There is suspicious gallbladder wall thickening and echogenic debris/stones within the gallbladder. If there is need to evaluate for ductal obstruction, nuclear medicine HIDA study would be recommended.  Signed,  Dulcy Fanny. Earleen Newport, DO  [facility]  [facility]   Electronically Signed   By: Corrie Mckusick D.O.   On: 08/25/2014 15:33    Scheduled Meds: . ampicillin-sulbactam (UNASYN) IV  1.5 g Intravenous Q12H  . heparin  5,000 Units Subcutaneous 3 times per day  . insulin aspart  0-5 Units Subcutaneous QHS  . insulin aspart  0-9 Units Subcutaneous TID WC  . levETIRAcetam  500 mg Intravenous Q12H  . levothyroxine  25 mcg Intravenous Daily  . vancomycin  1,000 mg Intravenous Q48H   Continuous Infusions: . dextrose 5 % and 0.9% NaCl 100 mL/hr at 08/26/14 1319    Principal Problem:   Sepsis Active Problems:   Weakness generalized   CVA, old, hemiparesis   UTI (lower urinary tract infection)   Seizure disorder   Hypertension   Acute renal failure   Dementia   Cholecystitis, acute   Seizures   Leukocytosis   Acidosis   Sepsis secondary to UTI   Alexsander Cavins, [facility] Pager [phone]. If 7PM-7AM, please contact  night-coverage at [email or web address], password [facility] 08/26/2014, 2:13 PM  LOS: 1 day

## 2014-08-26 NOTE — Progress Notes (Signed)
Lab called to report blood cultures growing gram positive cocci in pairs. MD made aware. Schonewitz, Eulis Canner 08/26/2014

## 2014-08-26 NOTE — Progress Notes (Signed)
CBC results back.  Primary MD and Surgeon notified of increased WBC.  New orders received and carried out.  Schonewitz, Eulis Canner 08/26/2014

## 2014-08-26 NOTE — Progress Notes (Signed)
[place] for Vancomycin & Unasyn Indication: rule out sepsis  Allergies  Allergen Reactions  . Codeine Other (See Comments)    Unknown reaction    Patient Measurements: Height: 5\' 4"  (162.6 cm) Weight: 143 lb (64.864 kg) IBW/kg (Calculated) : 54.7  Vital Signs: Temp: 98.2 F (36.8 C) (05/17 0805) Temp Source: Axillary (05/17 0805) BP: 127/59 mmHg (05/17 1000) Pulse Rate: 84 (05/17 1000) Intake/Output from previous day: 05/16 0701 - 05/17 0700 In: 1426.7 [I.V.:1326.7; IV Piggyback:100] Out: 275 [Urine:275] Intake/Output from this shift: Total I/O In: 300 [I.V.:300] Out: -   Labs:  Recent Labs  08/25/14 1005 08/25/14 2000 08/26/14 1030  WBC 33.3* 13.0* 25.4*  HGB 12.3 10.6* 9.4*  PLT 265 197 184  CREATININE 2.55*  --  1.94*   Estimated Creatinine Clearance: 17.6 mL/min (by C-G formula based on Cr of 1.94). No results for input(s): VANCOTROUGH, VANCOPEAK, VANCORANDOM, GENTTROUGH, GENTPEAK, GENTRANDOM, TOBRATROUGH, TOBRAPEAK, TOBRARND, AMIKACINPEAK, AMIKACINTROU, AMIKACIN in the last 72 hours.   Microbiology: Recent Results (from the past 720 hour(s))  Blood culture (routine x 2)     Status: None (Preliminary result)   Collection Time: 08/25/14 11:09 AM  Result Value Ref Range Status   Specimen Description BLOOD RIGHT HAND  Final   Special Requests BOTTLES DRAWN AEROBIC ONLY 4CC  Final   Culture NO GROWTH 1 DAY  Final   Report Status PENDING  Incomplete  MRSA PCR Screening     Status: None   Collection Time: 08/25/14  1:20 PM  Result Value Ref Range Status   MRSA by PCR NEGATIVE NEGATIVE Final    Comment:        The GeneXpert MRSA Assay (FDA approved for NASAL specimens only), is one component of a comprehensive MRSA colonization surveillance program. It is not intended to diagnose MRSA infection nor to guide or monitor treatment for MRSA infections.   Culture, blood (x 2)     Status: None (Preliminary result)   Collection Time: 08/25/14  8:00 PM  Result Value Ref Range Status   Specimen Description BLOOD PICC LINE DRAWN BY RN RL  Final   Special Requests BOTTLES DRAWN AEROBIC AND ANAEROBIC 6CC  Final   Culture NO GROWTH 1 DAY  Final   Report Status PENDING  Incomplete    Anti-infectives    Start     Dose/Rate Route Frequency Ordered Stop   08/26/14 1300  ampicillin-sulbactam (UNASYN) 1.5 g in sodium chloride 0.9 % 50 mL IVPB     1.5 g 100 mL/hr over 30 Minutes Intravenous Every 12 hours 08/26/14 1151     08/26/14 1200  cefTRIAXone (ROCEPHIN) 1 g in dextrose 5 % 50 mL IVPB - Premix  Status:  Discontinued     1 g 100 mL/hr over 30 Minutes Intravenous Every 24 hours 08/25/14 1436 08/25/14 1444   08/26/14 1200  cefTRIAXone (ROCEPHIN) 1 g in dextrose 5 % 50 mL IVPB  Status:  Discontinued     1 g 100 mL/hr over 30 Minutes Intravenous Every 24 hours 08/25/14 1445 08/26/14 1128   08/26/14 1200  vancomycin (VANCOCIN) IVPB 1000 mg/200 mL premix     1,000 mg 200 mL/hr over 60 Minutes Intravenous Every 48 hours 08/26/14 1151     08/25/14 1745  vancomycin (VANCOCIN) IVPB 1000 mg/200 mL premix  Status:  Discontinued     1,000 mg 200 mL/hr over 60 Minutes Intravenous  Once 08/25/14 1738 08/25/14 1747   08/25/14 1745  piperacillin-tazobactam (ZOSYN)  IVPB 3.375 g  Status:  Discontinued     3.375 g 12.5 mL/hr over 240 Minutes Intravenous  Once 08/25/14 1738 08/25/14 1748   08/25/14 1415  cefTRIAXone (ROCEPHIN) 2 g in dextrose 5 % 50 mL IVPB - Premix  Status:  Discontinued     2 g 100 mL/hr over 30 Minutes Intravenous  Once 08/25/14 1412 08/25/14 1418   08/25/14 1115  cefTRIAXone (ROCEPHIN) 1 g in dextrose 5 % 50 mL IVPB     1 g 100 mL/hr over 30 Minutes Intravenous  Once 08/25/14 1103 08/25/14 1252      Assessment: 81 yo F admitted with urosepsis & started on IV Rocephin.  This is being broadened to Vancomycin & Unasyn for continued fever and increasing WBC, lactic acid level.  Anaerobic coverage added  due to concern for cholecystitis.   Scr trending down.  Estimated CrCl <43ml/min.   Goal of Therapy:  Vancomycin trough level 15-20 mcg/ml  Eradicate infection.  Plan:  Unasyn 1.5gm IV q12h Vancomycin 1gm IV q48h Check Vancomycin trough at steady state Monitor renal function and cx data   Biagio Borg 08/26/2014,11:51 AM

## 2014-08-27 LAB — BASIC METABOLIC PANEL
Anion gap: 7 (ref 5–15)
BUN: 23 mg/dL — ABNORMAL HIGH (ref 6–20)
CO2: 22 mmol/L (ref 22–32)
Calcium: 6.5 mg/dL — ABNORMAL LOW (ref 8.9–10.3)
Chloride: 116 mmol/L — ABNORMAL HIGH (ref 101–111)
Creatinine, Ser: 1.53 mg/dL — ABNORMAL HIGH (ref 0.44–1.00)
GFR calc Af Amer: 34 mL/min — ABNORMAL LOW (ref >60)
GFR calc non Af Amer: 29 mL/min — ABNORMAL LOW (ref >60)
GLUCOSE: 442 mg/dL — AB (ref 65–99)
Potassium: 3.1 mmol/L — ABNORMAL LOW (ref 3.5–5.1)
SODIUM: 145 mmol/L (ref 135–145)

## 2014-08-27 LAB — GLUCOSE, CAPILLARY
GLUCOSE-CAPILLARY: 76 mg/dL (ref 65–99)
GLUCOSE-CAPILLARY: 158 mg/dL — AB (ref 65–99)
GLUCOSE-CAPILLARY: 101 mg/dL — AB (ref 65–99)
Glucose-Capillary: 151 mg/dL — ABNORMAL HIGH (ref 65–99)
Glucose-Capillary: 150 mg/dL — ABNORMAL HIGH (ref 65–99)

## 2014-08-27 LAB — CBC WITH DIFFERENTIAL/PLATELET
BASOS ABS: 0 10*3/uL (ref 0.0–0.1)
Basophils Relative: 0 % (ref 0–1)
Eosinophils Absolute: 0.2 10*3/uL (ref 0.0–0.7)
Eosinophils Relative: 1 % (ref 0–5)
HCT: 26.6 % — ABNORMAL LOW (ref 36.0–46.0)
Hemoglobin: 8.6 g/dL — ABNORMAL LOW (ref 12.0–15.0)
LYMPHS ABS: 1.1 10*3/uL (ref 0.7–4.0)
LYMPHS PCT: 5 % — AB (ref 12–46)
MCH: 31.4 pg (ref 26.0–34.0)
MCHC: 32.3 g/dL (ref 30.0–36.0)
MCV: 97.1 fL (ref 78.0–100.0)
Monocytes Absolute: 1.3 10*3/uL — ABNORMAL HIGH (ref 0.1–1.0)
Monocytes Relative: 6 % (ref 3–12)
NEUTROS ABS: 20.2 10*3/uL — AB (ref 1.7–7.7)
Neutrophils Relative %: 88 % — ABNORMAL HIGH (ref 43–77)
PLATELETS: 180 10*3/uL (ref 150–400)
RBC: 2.74 MIL/uL — AB (ref 3.87–5.11)
RDW: 14.6 % (ref 11.5–15.5)
WBC: 22.8 10*3/uL — AB (ref 4.0–10.5)

## 2014-08-27 MED ORDER — POTASSIUM CHLORIDE 10 MEQ/100ML IV SOLN
10.0000 meq | INTRAVENOUS | Status: AC
  Administered 2014-08-27 (×4): 10 meq via INTRAVENOUS
  Filled 2014-08-27 (×4): qty 100

## 2014-08-27 MED ORDER — MORPHINE SULFATE 2 MG/ML IJ SOLN
0.5000 mg | INTRAMUSCULAR | Status: DC | PRN
Start: 2014-08-27 — End: 2014-09-09
  Administered 2014-08-27 – 2014-09-04 (×12): 0.5 mg via INTRAVENOUS
  Filled 2014-08-27 (×13): qty 1

## 2014-08-27 NOTE — Progress Notes (Signed)
Discussed with Dr. Isaac Bliss. No changes in physical exam compared to yesterday. Leukocytosis mildly decreased. Should the question of cholecystitis arise again, would suggest HIDA scan. Discussed again with family. Will follow peripherally with you.

## 2014-08-27 NOTE — Progress Notes (Signed)
Antipyretic given prior to shift change (see MAR) Room temp lowered and heavy blanket removed, will recheck for effect.

## 2014-08-27 NOTE — Progress Notes (Signed)
[facility] PROGRESS NOTE  Tina Hunter EXB:[id number] DOB: Apr 14, 1924 DOA: 08/25/2014 PCP: No primary care provider on file.  Assessment/Plan: Sepsis presumed secondary to UTI -Culture with gram-negative rods, await speciation. -Antibiotics were broadened to vancomycin and Unasyn.  Question of acute cholecystitis -Doubt this diagnosis given findings of imaging, normal LFTs, physical exam. -Surgery, Dr. Arnoldo Morale following and agrees.  Acute renal failure -Improving with IV fluids. -Continue to follow renal function.  Gram-positive bacteremia -In one out of 2 blood cultures, suspect contaminant. -Continue vancomycin pending final culture data.  Hypokalemia -Replete IV.  Leukocytosis -Improved, follow with antibiotics.  Anemia -Hemoglobin has decreased from 12 on admission to 8, suspect related to fluid repletion. -Transfuse if less than 7.  Acute encephalopathy -Secondary to sepsis and acute infection on top of baseline dementia. -Daughter state that she is way worse than baseline at present. -Continue antibiotics and treat sepsis with hopes of improving mental and physical functioning, PT evaluation once more alert, suspect may need SNF.   Code Status: Partial code Family Communication: Both arteries at bedside updated on plan of care  Disposition Plan: To be determined, may need SNF   Consultants:  None   Antibiotics:  Vancomycin  Unasyn   Subjective: Very lethargic, when talked to starts to groan nonstop,, unable to answer questions  Objective: Filed Vitals:   08/27/14 0600 08/27/14 0700 08/27/14 0800 08/27/14 0900  BP: 155/70 171/76 160/66 131/55  Pulse: 84 85 97 94  Temp:   98.4 F (36.9 C)   TempSrc:   Axillary   Resp: 23 22 23 24   Height:      Weight:      SpO2: 100% 93% 100% 100%    Intake/Output Summary (Last 24 hours) at 08/27/14 1038 Last data filed at 08/27/14 0900  Gross per 24 hour  Intake   2650 ml  Output   1175 ml    Net   1475 ml   Filed Weights   08/25/14 0935  Weight: 64.864 kg (143 lb)    Exam:   General:  Lethargic  Cardiovascular: Regular rate and rhythm  Respiratory: Clear to rotation bilaterally  Abdomen: Obese, soft, nontender, nondistended, positive bowel sounds  Extremities: Trace bilateral pitting edema   Neurologic:  Unable to examine given current mental state  Data Reviewed: Basic Metabolic Panel:  Recent Labs Lab 08/25/14 1005 08/26/14 1030 08/27/14 0417  NA 141 143 145  K 4.1 3.7 3.1*  CL 100* 112* 116*  CO2 29 24 22   GLUCOSE 208* 100* 442*  BUN 30* 31* 23*  CREATININE 2.55* 1.94* 1.53*  CALCIUM 8.6* 6.9* 6.5*   Liver Function Tests:  Recent Labs Lab 08/25/14 1005 08/26/14 1030  AST 36 42*  ALT 62* 54  ALKPHOS 69 87  BILITOT 0.7 0.4  PROT 7.2 5.6*  ALBUMIN 3.3* 2.4*   No results for input(s): LIPASE, AMYLASE in the last 168 hours. No results for input(s): AMMONIA in the last 168 hours. CBC:  Recent Labs Lab 08/25/14 1005 08/25/14 2000 08/26/14 1030 08/27/14 0417  WBC 33.3* 13.0* 25.4* 22.8*  NEUTROABS 28.6* 12.7*  --  20.2*  HGB 12.3 10.6* 9.4* 8.6*  HCT 37.8 32.1* 28.8* 26.6*  MCV 96.7 95.5 96.3 97.1  PLT 265 197 184 180   Cardiac Enzymes:  Recent Labs Lab 08/25/14 1005 08/25/14 1813 08/26/14 0425  TROPONINI <0.03 0.03 0.05*   BNP (last 3 results) No results for input(s): BNP in the last 8760 hours.  ProBNP (last 3  results) No results for input(s): PROBNP in the last 8760 hours.  CBG:  Recent Labs Lab 08/26/14 1142 08/26/14 1631 08/26/14 2147 08/26/14 2201 08/27/14 0717  GLUCAP 76 111* 103* 106* 158*    Recent Results (from the past 240 hour(s))  Culture, Urine     Status: None (Preliminary result)   Collection Time: 08/25/14 10:18 AM  Result Value Ref Range Status   Specimen Description URINE, CATHETERIZED  Final   Special Requests NONE  Final   Colony Count   Final    >=100,000 COLONIES/ML Performed at  [facility]    Culture   Final    [place] Performed at [facility]    Report Status PENDING  Incomplete  Blood culture (routine x 2)     Status: None (Preliminary result)   Collection Time: 08/25/14 11:09 AM  Result Value Ref Range Status   Specimen Description BLOOD RIGHT HAND  Final   Special Requests BOTTLES DRAWN AEROBIC ONLY 4CC  Final   Culture NO GROWTH 2 DAYS  Final   Report Status PENDING  Incomplete  MRSA PCR Screening     Status: None   Collection Time: 08/25/14  1:20 PM  Result Value Ref Range Status   MRSA by PCR NEGATIVE NEGATIVE Final    Comment:        The GeneXpert MRSA Assay (FDA approved for NASAL specimens only), is one component of a comprehensive MRSA colonization surveillance program. It is not intended to diagnose MRSA infection nor to guide or monitor treatment for MRSA infections.   Culture, blood (x 2)     Status: None (Preliminary result)   Collection Time: 08/25/14  8:00 PM  Result Value Ref Range Status   Specimen Description BLOOD PICC LINE DRAWN BY RN RL  Final   Special Requests BOTTLES DRAWN AEROBIC AND ANAEROBIC 6CC  Final   Culture   Final    GRAM POSITIVE COCCI IN PAIRS AND CHAINS Note: Performed at [facility] Performed at [facility]    Report Status PENDING  Incomplete     Studies: Ct Abdomen Pelvis Wo Contrast  08/25/2014   CLINICAL DATA:  Fever and weakness  EXAM: CT ABDOMEN AND PELVIS WITHOUT CONTRAST  TECHNIQUE: Multidetector CT imaging of the abdomen and pelvis was performed following the standard protocol without IV contrast.  COMPARISON:  None.  FINDINGS: Lung bases are free of acute infiltrate or sizable effusion. A large hiatal hernia is noted.  The liver, spleen, adrenal glands and pancreas are within normal limits. The gallbladder is well distended demonstrates significant wall thickening with pericholecystic inflammatory changes and small dependent gallstones. These changes  are suggestive of acute cholecystitis in the appropriate clinical setting. Nonobstructing renal calculi are noted. A large right renal cyst is seen measuring 4.5 cm.  The colon demonstrates diffuse diverticular change. No diverticulitis or abscess is identified. The bladder is well distended. The uterus has been surgically removed. The bony structures are within normal limits. There are changes consistent with an IVC filter.  IMPRESSION: Changes suggestive of acute cholecystitis with gallstones. Ultrasound may be helpful for further evaluation. Correlation with physical exam is recommended as well.  Right renal cyst.  No other focal abnormality is seen.   Electronically Signed   By: Inez Catalina M.D.   On: 08/25/2014 12:54   Dg Chest 1 View  08/25/2014   CLINICAL DATA:  Weakness  EXAM: CHEST  1 VIEW  COMPARISON:  03/11/2014  FINDINGS:  Cardiac shadow is mildly enlarged but stable. Diffuse interstitial changes are again identified and stable from the prior exam. Multiple old left rib fractures are again seen. The previously noted hiatal hernia is less well appreciated on the current exam.  IMPRESSION: No acute abnormality noted.   Electronically Signed   By: Inez Catalina M.D.   On: 08/25/2014 11:41   Ct Head Wo Contrast  08/25/2014   CLINICAL DATA:  Recent change in mental status with weakness  EXAM: CT HEAD WITHOUT CONTRAST  TECHNIQUE: Contiguous axial images were obtained from the base of the skull through the vertex without intravenous contrast.  COMPARISON:  03/11/2014  FINDINGS: Bony calvarium again demonstrates postoperative changes on the right. Diffuse atrophic changes are noted. Some areas of encephalomalacia related to the previous surgery are noted. Chronic white matter ischemic changes noted as well. No findings to suggest acute hemorrhage, acute infarction or space-occupying mass lesion are noted. An old lacunar infarct is noted within the basal ganglia on the left.  IMPRESSION: Chronic atrophic and  ischemic changes.  No acute abnormality noted.   Electronically Signed   By: Inez Catalina M.D.   On: 08/25/2014 11:34   US Abdomen Complete  08/25/2014   CLINICAL DATA:  81 year old female with a history of fever and weakness. CT of the same date demonstrates changes of potential acute cholecystitis.  EXAM: ULTRASOUND ABDOMEN COMPLETE  COMPARISON:  CT 08/25/2014  FINDINGS: Gallbladder: Gallbladder distended with thickened wall measuring greater than 5 mm. Echogenic material at the fundus of the gallbladder layer dependently. No pericholecystic fluid. Sonographic Percell Miller sign is reported negative.  Common bile duct: Diameter: 3.1 mm  Liver: No focal lesion identified. Within normal limits in parenchymal echogenicity.  IVC: No abnormality visualized.  Pancreas: Visualized portion unremarkable.  Spleen: Size and appearance within normal limits.  Right Kidney: Length: 10.2 cm. Echogenicity of the right kidney similar to that of the adjacent liver. No hydronephrosis. Anechoic cystic structure measuring 5.2 cm x 4.4 cm x 5.0 cm, compatible with Bosniak 1 cyst. Nonobstructive stone which is present on the comparison CT is not visualized  Left Kidney: Length: 8.0 cm. Echogenicity of the left kidney similar to that of the adjacent spleen. No evidence of hydronephrosis.  Abdominal aorta: No aneurysm visualized.  Other findings: None.  IMPRESSION: Sonographic findings of the gallbladder are suspicious for acute cholecystitis, however are equivocal, as the sonographic Percell Miller sign is reportedly negative. There is suspicious gallbladder wall thickening and echogenic debris/stones within the gallbladder. If there is need to evaluate for ductal obstruction, nuclear medicine HIDA study would be recommended.  Signed,  Dulcy Fanny. Earleen Newport, DO  [facility]  [facility]   Electronically Signed   By: Corrie Mckusick D.O.   On: 08/25/2014 15:33    Scheduled Meds: . ampicillin-sulbactam  (UNASYN) IV  1.5 g Intravenous Q12H  . heparin  5,000 Units Subcutaneous 3 times per day  . insulin aspart  0-5 Units Subcutaneous QHS  . insulin aspart  0-9 Units Subcutaneous TID WC  . levETIRAcetam  500 mg Intravenous Q12H  . levothyroxine  25 mcg Intravenous Daily  . metoprolol  5 mg Intravenous 4 times per day  . vancomycin  1,000 mg Intravenous Q48H   Continuous Infusions: . dextrose 5 % and 0.9% NaCl 100 mL/hr at 08/27/14 0900    Principal Problem:   Sepsis Active Problems:   Weakness generalized   CVA, old, hemiparesis   UTI (lower urinary tract infection)  Seizure disorder   Hypertension   Acute renal failure   Dementia   Cholecystitis, acute   Seizures   Leukocytosis   Acidosis   Sepsis secondary to UTI    Time spent: 35 minutes. Greater than 50% of this time was spent in direct contact with the patient coordinating care.    Lelon Frohlich  [facility] Pager [phone]  If 7PM-7AM, please contact night-coverage at [email or web address], password [facility] 08/27/2014, 10:38 AM  LOS: 2 days

## 2014-08-28 ENCOUNTER — Other Ambulatory Visit: Payer: Self-pay | Admitting: Diagnostic Radiology

## 2014-08-28 ENCOUNTER — Inpatient Hospital Stay (HOSPITAL_COMMUNITY): Payer: Medicare Other

## 2014-08-28 DIAGNOSIS — F0390 Unspecified dementia without behavioral disturbance: Secondary | ICD-10-CM

## 2014-08-28 DIAGNOSIS — A4151 Sepsis due to Escherichia coli [E. coli]: Secondary | ICD-10-CM

## 2014-08-28 LAB — MAGNESIUM: MAGNESIUM: 1.4 mg/dL — AB (ref 1.7–2.4)

## 2014-08-28 LAB — GLUCOSE, CAPILLARY
GLUCOSE-CAPILLARY: 132 mg/dL — AB (ref 65–99)
GLUCOSE-CAPILLARY: 97 mg/dL (ref 65–99)
Glucose-Capillary: 157 mg/dL — ABNORMAL HIGH (ref 65–99)
Glucose-Capillary: 100 mg/dL — ABNORMAL HIGH (ref 65–99)

## 2014-08-28 LAB — URINE CULTURE: Colony Count: >=100000

## 2014-08-28 LAB — BASIC METABOLIC PANEL
ANION GAP: 6 (ref 5–15)
BUN: 17 mg/dL (ref 6–20)
CHLORIDE: 115 mmol/L — AB (ref 101–111)
CO2: 24 mmol/L (ref 22–32)
Calcium: 7.3 mg/dL — ABNORMAL LOW (ref 8.9–10.3)
Creatinine, Ser: 1.47 mg/dL — ABNORMAL HIGH (ref 0.44–1.00)
GFR calc non Af Amer: 31 mL/min — ABNORMAL LOW (ref >60)
GFR, EST AFRICAN AMERICAN: 36 mL/min — AB (ref >60)
GLUCOSE: 189 mg/dL — AB (ref 65–99)
Potassium: 3.7 mmol/L (ref 3.5–5.1)
Sodium: 145 mmol/L (ref 135–145)

## 2014-08-28 LAB — CBC
HEMATOCRIT: 28.4 % — AB (ref 36.0–46.0)
HEMOGLOBIN: 9.3 g/dL — AB (ref 12.0–15.0)
MCH: 31.5 pg (ref 26.0–34.0)
MCHC: 32.7 g/dL (ref 30.0–36.0)
MCV: 96.3 fL (ref 78.0–100.0)
Platelets: 186 10*3/uL (ref 150–400)
RBC: 2.95 MIL/uL — ABNORMAL LOW (ref 3.87–5.11)
RDW: 14.5 % (ref 11.5–15.5)
WBC: 24.9 10*3/uL — ABNORMAL HIGH (ref 4.0–10.5)

## 2014-08-28 MED ORDER — MORPHINE SULFATE 4 MG/ML IJ SOLN
2.6000 mg | INTRAMUSCULAR | Status: AC
  Administered 2014-08-28: 2.6 mg via INTRAVENOUS
  Filled 2014-08-28: qty 1

## 2014-08-28 MED ORDER — CEFTRIAXONE SODIUM 1 G IJ SOLR
1.0000 g | INTRAMUSCULAR | Status: DC
  Administered 2014-08-28 – 2014-09-02 (×6): 1 g via INTRAVENOUS
  Filled 2014-08-28 (×7): qty 10

## 2014-08-28 MED ORDER — TECHNETIUM TC 99M MEBROFENIN IV KIT
5.0000 | PACK | Freq: Once | INTRAVENOUS | Status: AC | PRN
  Administered 2014-08-28: 5.4 via INTRAVENOUS

## 2014-08-28 MED ORDER — STERILE WATER FOR INJECTION IJ SOLN
INTRAMUSCULAR | Status: AC
  Filled 2014-08-28: qty 10

## 2014-08-28 MED ORDER — SODIUM CHLORIDE 0.9 % IV SOLN
INTRAVENOUS | Status: DC
  Administered 2014-08-28: 10:00:00 via INTRAVENOUS

## 2014-08-28 NOTE — Clinical Social Work Note (Signed)
Clinical Social Work Assessment  Patient Details  Name: Tina Hunter MRN: [id number] Date of Birth: 09/02/1924  Date of referral:  08/28/14               Reason for consult:  Facility Placement                Permission sought to share information with:    Permission granted to share information::     Name::        Agency::     Relationship::     Contact Information:     Housing/Transportation Living arrangements for the past 2 months:  Single Family Home Source of Information:  Adult Children Patient Interpreter Needed:  None Criminal Activity/Legal Involvement Pertinent to Current Situation/Hospitalization:  No - Comment as needed Significant Relationships:  Adult Children Lives with:  Adult Children Do you feel safe going back to the place where you live?  Yes Need for family participation in patient care:  Yes (Comment) (Family will need to continue to provide support to patient)  Care giving concerns:  None at this time    Facilities manager / plan:  CSW spoke with patient's daughter's Tina Hunter and Tina Hunter. CSW discussed with patient SNF for rehab upon discharge.  Ms. Tina Hunter advised that patient resides with her Ms. Tina Hunter.  She stated that patient ambulates with a walker around the house as will as with their support.  Ms. Tina Hunter advised that patient uses a wheelchair when they leave the home.  She stated that Ms. Tina Hunter provides assistance with ADLs.  Ms. Tina Hunter advised that patient has been in rehab at [facility] in [place] in the past.  CSW provided that family with a SNF list.  Both Ms. Tina Hunter and Ms. Tina Hunter advised that they would discuss SNF placement and let CSW know.    Employment status:  Retired Forensic scientist:  Medicare PT Recommendations:  Not assessed at this time [place] / Referral to community resources:  [place]  Patient/Family's Response to care:  Family is considering SNF  placement.  Patient/Family's Understanding of and Emotional Response to Diagnosis, Current Treatment, and Prognosis:  Family verbalizes understanding of how patient diagnosis, treatment and prognosis.  Ms. Tina Hunter indicated that she was a little concerned about her mother's prognosis after speaking with the doctor earlier this morning.   Emotional Assessment Appearance:  Developmentally appropriate Attitude/Demeanor/Rapport:  Unable to Assess Affect (typically observed):  Unable to Assess Orientation:   (unable to assess) Alcohol / Substance use:  Not Applicable Psych involvement (Current and /or in the community):  No (Comment)  Discharge Needs  Concerns to be addressed:  No discharge needs identified Readmission within the last 30 days:  No Current discharge risk:  None Barriers to Discharge:  No Barriers Identified   Tina Gully, LCSW 08/28/2014, 4:11 PM

## 2014-08-28 NOTE — Progress Notes (Signed)
[facility] PROGRESS NOTE  Tina Hunter ZMO:[id number] DOB: Dec 28, 1924 DOA: 08/25/2014 PCP: No primary care provider on file.  Assessment/Plan: Sepsis secondary to UTI -Cultures with Escherichia coli. -Sensitive to Rocephin, we'll de-escalate Unasyn to Rocephin. -Sepsis is not improving despite fluids and appropriate antibiotic therapy, she is still febrile and with rising WBC count. Her encephalopathy has failed to improve as well. -Recheck blood cultures. Keep in ICU. -Check chest x-ray to make sure not missing another source of infection.  Gram-positive bacteremia -1 out of 2 cultures with gram-positive cocci in clusters and chains. -Continue vancomycin pending speciation and sensitivity. -Suspect this is a contaminant.  Acute renal failure -Continues to improve slightly with IV fluids. -Continue to follow renal function.  Hypokalemia -Repleted.  Hypomagnesemia -Given 2 g of magnesium sulfate yesterday, will give 2 more grams today as magnesium is still low at 1.4.  Leukocytosis -Worsening today to 25. -Secondary to sepsis UTI and bacteremia. -Continue to monitor on antibiotics.  Acute encephalopathy -Secondary to sepsis on top of baseline dementia. -No improvements to baseline mental state per daughters at bedside.  Anemia -Transfuse if less than 7. -Stable around 8.5-9.5 range.  Diabetes mellitus -CBGs with fair control. -Continue current regimen.  Code Status: Remains a partial code Family Communication: Discussed with 2 daughters at bedside  Disposition Plan: Keep in ICU today   Consultants:  None   Antibiotics:  Vancomycin  Rocephin   Subjective: No significant improvement, remains febrile, is moaning and groaning but is not able to localize any pain today.  Objective: Filed Vitals:   08/28/14 0600 08/28/14 0700 08/28/14 0737 08/28/14 0800  BP: 152/70 182/62  112/64  Pulse: 85 85  101  Temp:   98.9 F (37.2 C)   TempSrc:    Axillary   Resp: 23 19  18   Height:      Weight:      SpO2: 99% 96%  100%    Intake/Output Summary (Last 24 hours) at 08/28/14 0925 Last data filed at 08/28/14 0354  Gross per 24 hour  Intake 2378.33 ml  Output   1150 ml  Net 1228.33 ml   Filed Weights   08/25/14 0935  Weight: 64.864 kg (143 lb)    Exam:   General:  Drowsy, not able to respond appropriately to questions  Cardiovascular: Regular rate and rhythm  Respiratory: Clear to auscultation bilaterally  Abdomen: Soft, nontender, nondistended, positive bowel sounds  Extremities: No clubbing, cyanosis or edema   Neurologic:  Moves all 4 spontaneously, has residual left hemiparesis from prior stroke, and unable to fully assess given current mental state  Data Reviewed: Basic Metabolic Panel:  Recent Labs Lab 08/25/14 1005 08/26/14 1030 08/27/14 0417 08/28/14 0503  NA 141 143 145 145  K 4.1 3.7 3.1* 3.7  CL 100* 112* 116* 115*  CO2 29 24 22 24   GLUCOSE 208* 100* 442* 189*  BUN 30* 31* 23* 17  CREATININE 2.55* 1.94* 1.53* 1.47*  CALCIUM 8.6* 6.9* 6.5* 7.3*  MG  --   --   --  1.4*   Liver Function Tests:  Recent Labs Lab 08/25/14 1005 08/26/14 1030  AST 36 42*  ALT 62* 54  ALKPHOS 69 87  BILITOT 0.7 0.4  PROT 7.2 5.6*  ALBUMIN 3.3* 2.4*   No results for input(s): LIPASE, AMYLASE in the last 168 hours. No results for input(s): AMMONIA in the last 168 hours. CBC:  Recent Labs Lab 08/25/14 1005 08/25/14 2000 08/26/14 1030 08/27/14 0417 08/28/14  0503  WBC 33.3* 13.0* 25.4* 22.8* 24.9*  NEUTROABS 28.6* 12.7*  --  20.2*  --   HGB 12.3 10.6* 9.4* 8.6* 9.3*  HCT 37.8 32.1* 28.8* 26.6* 28.4*  MCV 96.7 95.5 96.3 97.1 96.3  PLT 265 197 184 180 186   Cardiac Enzymes:  Recent Labs Lab 08/25/14 1005 08/25/14 1813 08/26/14 0425  TROPONINI <0.03 0.03 0.05*   BNP (last 3 results) No results for input(s): BNP in the last 8760 hours.  ProBNP (last 3 results) No results for input(s): PROBNP  in the last 8760 hours.  CBG:  Recent Labs Lab 08/27/14 0717 08/27/14 1141 08/27/14 1559 08/27/14 1955 08/28/14 0706  GLUCAP 158* 151* 101* 150* 157*    Recent Results (from the past 240 hour(s))  Culture, Urine     Status: None (Preliminary result)   Collection Time: 08/25/14 10:18 AM  Result Value Ref Range Status   Specimen Description URINE, CATHETERIZED  Final   Special Requests NONE  Final   Colony Count   Final    >=100,000 COLONIES/ML Performed at [facility]    Culture   Final    [place] Performed at [facility]    Report Status PENDING  Incomplete   Organism ID, Bacteria ESCHERICHIA COLI  Final      Susceptibility   Escherichia coli - MIC*    CEFAZOLIN <=4 SENSITIVE Sensitive     CEFTRIAXONE <=1 SENSITIVE Sensitive     CIPROFLOXACIN >=4 RESISTANT Resistant     GENTAMICIN <=1 SENSITIVE Sensitive     LEVOFLOXACIN >=8 RESISTANT Resistant     NITROFURANTOIN 64 INTERMEDIATE Intermediate     TOBRAMYCIN <=1 SENSITIVE Sensitive     * ESCHERICHIA COLI  Blood culture (routine x 2)     Status: None (Preliminary result)   Collection Time: 08/25/14 11:09 AM  Result Value Ref Range Status   Specimen Description BLOOD RIGHT HAND  Final   Special Requests BOTTLES DRAWN AEROBIC ONLY 4CC  Final   Culture NO GROWTH 2 DAYS  Final   Report Status PENDING  Incomplete  MRSA PCR Screening     Status: None   Collection Time: 08/25/14  1:20 PM  Result Value Ref Range Status   MRSA by PCR NEGATIVE NEGATIVE Final    Comment:        The GeneXpert MRSA Assay (FDA approved for NASAL specimens only), is one component of a comprehensive MRSA colonization surveillance program. It is not intended to diagnose MRSA infection nor to guide or monitor treatment for MRSA infections.   Culture, blood (x 2)     Status: None (Preliminary result)   Collection Time: 08/25/14  8:00 PM  Result Value Ref Range Status   Specimen Description  BLOOD PICC LINE DRAWN BY RN RL  Final   Special Requests BOTTLES DRAWN AEROBIC AND ANAEROBIC 6CC  Final   Culture   Final    GRAM POSITIVE COCCI IN PAIRS AND CHAINS Note: Performed at [facility] Performed at [facility]    Report Status PENDING  Incomplete     Studies: No results found.  Scheduled Meds: . cefTRIAXone (ROCEPHIN)  IV  1 g Intravenous Q24H  . heparin  5,000 Units Subcutaneous 3 times per day  . insulin aspart  0-5 Units Subcutaneous QHS  . insulin aspart  0-9 Units Subcutaneous TID WC  . levETIRAcetam  500 mg Intravenous Q12H  . levothyroxine  25 mcg Intravenous Daily  . metoprolol  5 mg Intravenous 4 times per day  . vancomycin  1,000 mg Intravenous Q48H   Continuous Infusions: . dextrose 5 % and 0.9% NaCl 100 mL/hr at 08/28/14 0100    Principal Problem:   Sepsis Active Problems:   Weakness generalized   CVA, old, hemiparesis   UTI (lower urinary tract infection)   Seizure disorder   Hypertension   Acute renal failure   Dementia   Cholecystitis, acute   Seizures   Leukocytosis   Acidosis   Sepsis secondary to UTI    Time spent: 40 minutes. Greater than 50% of this time was spent in direct contact with the patient coordinating care.    Lelon Frohlich  [facility] Pager [phone]  If 7PM-7AM, please contact night-coverage at [email or web address], password [facility] 08/28/2014, 9:25 AM  LOS: 3 days

## 2014-08-29 ENCOUNTER — Encounter (HOSPITAL_COMMUNITY): Payer: Self-pay

## 2014-08-29 ENCOUNTER — Ambulatory Visit (HOSPITAL_COMMUNITY)
Admit: 2014-08-29 | Discharge: 2014-08-29 | Disposition: A | Discharge status: Home / Self Care | Payer: Medicare Other | Attending: Internal Medicine | Admitting: Internal Medicine

## 2014-08-29 ENCOUNTER — Inpatient Hospital Stay (HOSPITAL_COMMUNITY): Payer: Medicare Other

## 2014-08-29 DIAGNOSIS — D72829 Elevated white blood cell count, unspecified: Secondary | ICD-10-CM

## 2014-08-29 LAB — HEPATIC FUNCTION PANEL
ALBUMIN: 2.1 g/dL — AB (ref 3.5–5.0)
ALT: 34 U/L (ref 14–54)
AST: 32 U/L (ref 15–41)
Alkaline Phosphatase: 207 U/L — ABNORMAL HIGH (ref 38–126)
BILIRUBIN DIRECT: 0.1 mg/dL (ref 0.1–0.5)
BILIRUBIN TOTAL: 0.6 mg/dL (ref 0.3–1.2)
Indirect Bilirubin: 0.5 mg/dL (ref 0.3–0.9)
Total Protein: 5.6 g/dL — ABNORMAL LOW (ref 6.5–8.1)

## 2014-08-29 LAB — GLUCOSE, CAPILLARY
GLUCOSE-CAPILLARY: 84 mg/dL (ref 65–99)
Glucose-Capillary: 87 mg/dL (ref 65–99)
Glucose-Capillary: 96 mg/dL (ref 65–99)
Glucose-Capillary: 87 mg/dL (ref 65–99)

## 2014-08-29 LAB — CBC
HCT: 28.2 % — ABNORMAL LOW (ref 36.0–46.0)
Hemoglobin: 9.1 g/dL — ABNORMAL LOW (ref 12.0–15.0)
MCH: 31.2 pg (ref 26.0–34.0)
MCHC: 32.3 g/dL (ref 30.0–36.0)
MCV: 96.6 fL (ref 78.0–100.0)
Platelets: 185 10*3/uL (ref 150–400)
RBC: 2.92 MIL/uL — AB (ref 3.87–5.11)
RDW: 14.7 % (ref 11.5–15.5)
WBC: 19.4 10*3/uL — AB (ref 4.0–10.5)

## 2014-08-29 LAB — BASIC METABOLIC PANEL
ANION GAP: 8 (ref 5–15)
BUN: 17 mg/dL (ref 6–20)
CALCIUM: 7.7 mg/dL — AB (ref 8.9–10.3)
CO2: 23 mmol/L (ref 22–32)
Chloride: 116 mmol/L — ABNORMAL HIGH (ref 101–111)
Creatinine, Ser: 1.73 mg/dL — ABNORMAL HIGH (ref 0.44–1.00)
GFR, EST AFRICAN AMERICAN: 29 mL/min — AB (ref >60)
GFR, EST NON AFRICAN AMERICAN: 25 mL/min — AB (ref >60)
Glucose, Bld: 108 mg/dL — ABNORMAL HIGH (ref 65–99)
Potassium: 3.6 mmol/L (ref 3.5–5.1)
SODIUM: 147 mmol/L — AB (ref 135–145)

## 2014-08-29 MED ORDER — FENTANYL CITRATE (PF) 100 MCG/2ML IJ SOLN
INTRAMUSCULAR | Status: AC | PRN
  Administered 2014-08-29: 25 ug via INTRAVENOUS

## 2014-08-29 MED ORDER — FUROSEMIDE 10 MG/ML IJ SOLN
40.0000 mg | Freq: Once | INTRAMUSCULAR | Status: AC
  Administered 2014-08-29: 40 mg via INTRAVENOUS
  Filled 2014-08-29: qty 4

## 2014-08-29 MED ORDER — CETYLPYRIDINIUM CHLORIDE 0.05 % MT LIQD
7.0000 mL | Freq: Two times a day (BID) | OROMUCOSAL | Status: DC
  Administered 2014-08-29 – 2014-09-09 (×23): 7 mL via OROMUCOSAL

## 2014-08-29 MED ORDER — MIDAZOLAM HCL 2 MG/2ML IJ SOLN
INTRAMUSCULAR | Status: AC | PRN
  Administered 2014-08-29: 0.5 mg via INTRAVENOUS

## 2014-08-29 MED ORDER — ALBUTEROL SULFATE (2.5 MG/3ML) 0.083% IN NEBU
2.5000 mg | INHALATION_SOLUTION | RESPIRATORY_TRACT | Status: DC | PRN
  Administered 2014-08-29 – 2014-09-03 (×2): 2.5 mg via RESPIRATORY_TRACT
  Filled 2014-08-29 (×2): qty 3

## 2014-08-29 MED ORDER — IOHEXOL 300 MG/ML  SOLN
50.0000 mL | Freq: Once | INTRAMUSCULAR | Status: AC | PRN
  Administered 2014-08-29: 10 mL

## 2014-08-29 NOTE — Progress Notes (Signed)
[facility] PROGRESS NOTE  Rhenda [place] XTG:[id number] DOB: Jun 19, 1924 DOA: 08/25/2014 PCP: No primary care provider on file.  Assessment/Plan:  Acute dyspnea -Chest x-ray with evidence for pulmonary edema. -Has never had a 2-D echo in our system. -Probably has become fluid overloaded with fluids given for sepsis. -We'll give her 40 mg of IV Lasix 1 and document her diuretic response. -We'll also order 2-D echo.  Sepsis -Likely secondary to UTI and also possibly acute cholecystitis. -One out of 2 blood cultures also growing enterococcus with sensitivities pending. -We'll continue vancomycin and Rocephin for now as it seems adequate coverage given her current culture data. -Repeat blood cultures are pending at this time. -She remains hemodynamically stable, however clinically she appears much worse today. -She has been afebrile overnight and her WBC count has decreased slightly to 19.  UTI -Cultures growing Escherichia coli sensitive to Rocephin and group D strep. -Vancomycin and Rocephin should be sufficient coverage.  Acute cholecystitis -HIDA scan seems to confirm this diagnosis. -Discussed with surgery, Dr. Arnoldo Morale. We both believe she is too sick to undergo cholecystectomy. -We'll request cholecystostomy tube to be placed by IR.  Enterococcal bacteremia -Continue vancomycin for now. -Discussed management via phone with ID, Dr. Tommy Medal.  Acute renal failure -Slightly worse today. -Do not know her baseline. -We expect slight worsening getting ongoing diuresis.  Acute encephalopathy -Secondary to sepsis phenomena on top of baseline dementia. -Appears more lethargic today.  Hypokalemia -Repleted.  Hypomagnesemia -Repleted.  Leukocytosis -Improvement overnight from 25-19.  Anemia -Hemoglobin remained stable at 8.5-9.5 range. -Transfusion and hemoglobin were to drop below 7.  Diabetes mellitus -Well controlled. -Continue current  management.  Code Status: Remains partial code; no intubation or CPR but they would accept vasopressor agents. Family Communication: Daughter at bedside updated on plan of care  Disposition Plan: To [facility] today for IR placement of cholecystostomy. Keep in ICU.   Consultants:  Surgery, Dr. Arnoldo Morale   Antibiotics:  Vancomycin  Rocephin   Subjective: Nonverbal, appears more lethargic today with difficulty breathing.  Objective: Filed Vitals:   08/29/14 0700 08/29/14 0730 08/29/14 0800 08/29/14 0830  BP: 143/77 158/85 179/104 168/63  Pulse: 88 88 89 91  Temp:      TempSrc:      Resp:      Height:      Weight:      SpO2: 98% 98% 100% 100%    Intake/Output Summary (Last 24 hours) at 08/29/14 0911 Last data filed at 08/29/14 0800  Gross per 24 hour  Intake   1975 ml  Output    950 ml  Net   1025 ml   Filed Weights   08/25/14 0935  Weight: 64.864 kg (143 lb)    Exam:   General:  Lethargic, tachypneic, audible wheezes  Cardiovascular: Regular rate and rhythm, no murmurs, rubs or gallops  Respiratory: Faint bibasilar crackles, inspiratory and expiratory wheezes  Abdomen: Soft, nondistended, positive bowel sounds, no guarding or rigidity  Extremities: Trace bilateral pitting edema   Neurologic:  Residual left hemiparesis from prior stroke  Data Reviewed: Basic Metabolic Panel:  Recent Labs Lab 08/25/14 1005 08/26/14 1030 08/27/14 0417 08/28/14 0503 08/29/14 0433  NA 141 143 145 145 147*  K 4.1 3.7 3.1* 3.7 3.6  CL 100* 112* 116* 115* 116*  CO2 29 24 22 24 23   GLUCOSE 208* 100* 442* 189* 108*  BUN 30* 31* 23* 17 17  CREATININE 2.55* 1.94* 1.53* 1.47* 1.73*  CALCIUM 8.6*  6.9* 6.5* 7.3* 7.7*  MG  --   --   --  1.4*  --    Liver Function Tests:  Recent Labs Lab 08/25/14 1005 08/26/14 1030  AST 36 42*  ALT 62* 54  ALKPHOS 69 87  BILITOT 0.7 0.4  PROT 7.2 5.6*  ALBUMIN 3.3* 2.4*   No results for input(s): LIPASE, AMYLASE in the last 168  hours. No results for input(s): AMMONIA in the last 168 hours. CBC:  Recent Labs Lab 08/25/14 1005 08/25/14 2000 08/26/14 1030 08/27/14 0417 08/28/14 0503 08/29/14 0433  WBC 33.3* 13.0* 25.4* 22.8* 24.9* 19.4*  NEUTROABS 28.6* 12.7*  --  20.2*  --   --   HGB 12.3 10.6* 9.4* 8.6* 9.3* 9.1*  HCT 37.8 32.1* 28.8* 26.6* 28.4* 28.2*  MCV 96.7 95.5 96.3 97.1 96.3 96.6  PLT 265 197 184 180 186 185   Cardiac Enzymes:  Recent Labs Lab 08/25/14 1005 08/25/14 1813 08/26/14 0425  TROPONINI <0.03 0.03 0.05*   BNP (last 3 results) No results for input(s): BNP in the last 8760 hours.  ProBNP (last 3 results) No results for input(s): PROBNP in the last 8760 hours.  CBG:  Recent Labs Lab 08/28/14 0706 08/28/14 1057 08/28/14 1625 08/28/14 2018 08/29/14 0720  GLUCAP 157* 132* 97 100* 87    Recent Results (from the past 240 hour(s))  Culture, Urine     Status: None   Collection Time: 08/25/14 10:18 AM  Result Value Ref Range Status   Specimen Description URINE, CATHETERIZED  Final   Special Requests NONE  Final   Colony Count   Final    >=100,000 COLONIES/ML Performed at [facility]    Culture   Final    [place] D;high probability for S.bovis Performed at [facility]    Report Status 08/28/2014 FINAL  Final   Organism ID, Bacteria ESCHERICHIA COLI  Final   Organism ID, Bacteria STREPTOCOCCUS GROUP D;high probability for S.bovis  Final      Susceptibility   Escherichia coli - MIC*    CEFAZOLIN <=4 SENSITIVE Sensitive     CEFTRIAXONE <=1 SENSITIVE Sensitive     CIPROFLOXACIN >=4 RESISTANT Resistant     GENTAMICIN <=1 SENSITIVE Sensitive     LEVOFLOXACIN >=8 RESISTANT Resistant     NITROFURANTOIN 64 INTERMEDIATE Intermediate     TOBRAMYCIN <=1 SENSITIVE Sensitive     * ESCHERICHIA COLI   Streptococcus group d;high probability for s.bovis - MIC (ETEST)*    PENICILLIN 0.125 SENSITIVE Sensitive     * STREPTOCOCCUS GROUP  D;high probability for S.bovis  Blood culture (routine x 2)     Status: None (Preliminary result)   Collection Time: 08/25/14 11:09 AM  Result Value Ref Range Status   Specimen Description BLOOD RIGHT HAND  Final   Special Requests BOTTLES DRAWN AEROBIC ONLY 4CC  Final   Culture NO GROWTH 4 DAYS  Final   Report Status PENDING  Incomplete  MRSA PCR Screening     Status: None   Collection Time: 08/25/14  1:20 PM  Result Value Ref Range Status   MRSA by PCR NEGATIVE NEGATIVE Final    Comment:        The GeneXpert MRSA Assay (FDA approved for NASAL specimens only), is one component of a comprehensive MRSA colonization surveillance program. It is not intended to diagnose MRSA infection nor to guide or monitor treatment for MRSA infections.   Culture, blood (x 2)     Status:  None (Preliminary result)   Collection Time: 08/25/14  8:00 PM  Result Value Ref Range Status   Specimen Description BLOOD PICC LINE DRAWN BY RN RL  Final   Special Requests BOTTLES DRAWN AEROBIC AND ANAEROBIC [place]  Final   Culture   Final    ENTEROCOCCUS SPECIES Note: Performed at [facility] Performed at [facility]    Report Status PENDING  Incomplete  Culture, blood (routine x 2)     Status: None (Preliminary result)   Collection Time: 08/28/14 10:00 AM  Result Value Ref Range Status   Specimen Description BLOOD PORTA CATH DRAWN BY RN  Final   Special Requests   Final    BOTTLES DRAWN AEROBIC AND ANAEROBIC AEB=8CC ANA=6CC   Culture NO GROWTH 1 DAY  Final   Report Status PENDING  Incomplete  Culture, blood (routine x 2)     Status: None (Preliminary result)   Collection Time: 08/28/14 10:18 AM  Result Value Ref Range Status   Specimen Description BLOOD LEFT HAND  Final   Special Requests BOTTLES DRAWN AEROBIC AND ANAEROBIC 6CC  Final   Culture NO GROWTH 1 DAY  Final   Report Status PENDING  Incomplete     Studies: Nm Hepato W/eject Fract  08/28/2014   CLINICAL DATA:  RIGHT upper  quadrant abdominal pain, abnormal gallbladder by ultrasound showing gallstones, distension and thickened wall  EXAM: NUCLEAR MEDICINE HEPATOBILIARY IMAGING  TECHNIQUE: Sequential images of the abdomen were obtained out to 60 minutes following intravenous administration of radiopharmaceutical.  RADIOPHARMACEUTICALS:  5.4 mCi Tc-12m Choletec IV  Pharmaceutical:  2.6 mg morphine sulfate IV  COMPARISON:  Ultrasound abdomen 08/25/2014  FINDINGS: Normal tracer extraction by bloodstream.  Prolonged hepatocellular retention of tracer indicating a degree of hepatocellular dysfunction.  Excretion of tracer into the biliary tree is identified with bowel visualized by 18 minutes.  At 36 minutes gallbladder had not visualized.  Patient then received morphine IV and imaging was continued for an additional 30 minutes.  Gallbladder failed to visualized following morphine augmentation.  IMPRESSION: Patent common hepatic duct and common bile duct with passage of tracer into small bowel.  Hepatocellular dysfunction evidenced by prolonged hepatic retention of tracer.  Nonvisualization of the gallbladder despite morphine augmentation consistent with acute cholecystitis; please note that false-positive exams can occur however in patients with prolonged fasting.   Electronically Signed   By: Lavonia Dana M.D.   On: 08/28/2014 16:51   Dg Chest Port 1 View  08/28/2014   CLINICAL DATA:  Shortness of breath, urinary tract infection and sepsis, previous CVA, chronic bronchitis.  EXAM: PORTABLE CHEST - 1 VIEW  COMPARISON:  Portable chest x-ray of Aug 25, 2014  FINDINGS: The lungs are mildly hypoinflated. The pulmonary interstitial markings are more conspicuous today especially at the right lung base. The cardiac silhouette remains enlarged. The central pulmonary vascularity is engorged. The bony thorax exhibits no acute abnormality.  IMPRESSION: Mild hypo inflation which accentuates the lung markings. However, low-grade CHF is suspected.  Interval development of subsegmental atelectasis at the right lung base is suspected. If the patient can tolerate the procedure, a PA and lateral chest x-ray would be useful.   Electronically Signed   By: David  Martinique M.D.   On: 08/28/2014 12:03    Scheduled Meds: . antiseptic oral rinse  7 mL Mouth Rinse BID  . cefTRIAXone (ROCEPHIN)  IV  1 g Intravenous Q24H  . furosemide  40 mg Intravenous Once  . heparin  5,000 Units Subcutaneous 3 times per day  . insulin aspart  0-5 Units Subcutaneous QHS  . insulin aspart  0-9 Units Subcutaneous TID WC  . levETIRAcetam  500 mg Intravenous Q12H  . levothyroxine  25 mcg Intravenous Daily  . metoprolol  5 mg Intravenous 4 times per day  . vancomycin  1,000 mg Intravenous Q48H   Continuous Infusions:   Principal Problem:   Sepsis Active Problems:   Weakness generalized   CVA, old, hemiparesis   UTI (lower urinary tract infection)   Seizure disorder   Hypertension   Acute renal failure   Dementia   Cholecystitis, acute   Seizures   Leukocytosis   Acidosis   Sepsis secondary to UTI    Time spent: 45 minutes. Greater than 50% of this time was spent in direct contact with the patient coordinating care.    Lelon Frohlich  [facility] Pager [phone]  If 7PM-7AM, please contact night-coverage at [email or web address], password [facility] 08/29/2014, 9:11 AM  LOS: 4 days

## 2014-08-29 NOTE — H&P (Signed)
Referring Physician(s): Lelon Frohlich Y  History of Present Illness: Tina Hunter is a 81 y.o. female who presented [facility] with AMS found to have UTI, ARF, sepsis and acute cholecystitis s/p HIDA. The patient is lethargic and confused and history is obtained per chart review and from the patient's daughter. IR received request for percutaneous  Cholecystostomy since patient is too high risk for surgery.   Past Medical History  Diagnosis Date  . Stroke   . Diabetes mellitus without complication   . Hypertension   . High cholesterol   . Brain tumor (benign)   . Chronic bronchitis   . Seizures     Past Surgical History  Procedure Laterality Date  . Brain surgery    . Abdominal hysterectomy      Allergies: Codeine  Medications: Prior to Admission medications   Medication Sig Start Date End Date Taking? Authorizing Provider  amLODipine (NORVASC) 10 MG tablet Take 10 mg by mouth daily.    Historical Provider, MD  aspirin EC 81 MG tablet Take 81 mg by mouth daily.    Historical Provider, MD  atenolol (TENORMIN) 50 MG tablet Take 50 mg by mouth daily.    Historical Provider, MD  Calcium Carbonate-Vitamin D (CALCARB 600/D PO) Take 1 tablet by mouth 2 (two) times daily.    Historical Provider, MD  carbamazepine (TEGRETOL) 200 MG tablet Take 200 mg by mouth 3 (three) times daily.    Historical Provider, MD  donepezil (ARICEPT) 10 MG tablet Take 10 mg by mouth daily.    Historical Provider, MD  escitalopram (LEXAPRO) 10 MG tablet Take 10 mg by mouth daily.    Historical Provider, MD  Fe Fum-FA-B Cmp-C-Zn-Mg-Mn-Cu (HEMATINIC PLUS COMPLEX) 106-1 MG TABS Take 1 tablet by mouth daily.    Historical Provider, MD  glimepiride (AMARYL) 4 MG tablet Take 4 mg by mouth daily with breakfast.    Historical Provider, MD  levothyroxine (SYNTHROID, LEVOTHROID) 50 MCG tablet Take 50 mcg by mouth daily before breakfast.    Historical Provider, MD  losartan (COZAAR) 100 MG tablet Take 100 mg  by mouth daily.    Historical Provider, MD  meclizine (ANTIVERT) 25 MG tablet Take 25 mg by mouth daily as needed for dizziness.    Historical Provider, MD  memantine (NAMENDA) 10 MG tablet Take 10 mg by mouth 2 (two) times daily.    Historical Provider, MD  pantoprazole (PROTONIX) 20 MG tablet Take 20 mg by mouth daily.    Historical Provider, MD  rosuvastatin (CRESTOR) 20 MG tablet Take 20 mg by mouth every evening.    Historical Provider, MD     History reviewed. No pertinent family history.  History   Social History  . Marital Status: Widowed    Spouse Name: N/A  . Number of Children: N/A  . Years of Education: N/A   Social History Main Topics  . Smoking status: Never Smoker   . Smokeless tobacco: Not on file  . Alcohol Use: No  . Drug Use: No  . Sexual Activity: Not on file   Other Topics Concern  . None   Social History Narrative   Review of Systems: A 12 point ROS discussed and pertinent positives are indicated in the HPI above.  All other systems are negative.  Review of Systems  Vital Signs: T: 98.70F, HR: 92bpm, BP 166/62 mmHg  Physical Exam General: Confused, agitated  Heart: RRR  Lungs: b/l crackles bases Abd: Soft, NT  Mallampati Score:  MD  Evaluation Airway: WNL Heart: WNL Abdomen: WNL Chest/ Lungs: WNL ASA  Classification: 3 Mallampati/Airway Score: Two  Imaging: Ct Abdomen Pelvis Wo Contrast  08/25/2014   CLINICAL DATA:  Fever and weakness  EXAM: CT ABDOMEN AND PELVIS WITHOUT CONTRAST  TECHNIQUE: Multidetector CT imaging of the abdomen and pelvis was performed following the standard protocol without IV contrast.  COMPARISON:  None.  FINDINGS: Lung bases are free of acute infiltrate or sizable effusion. A large hiatal hernia is noted.  The liver, spleen, adrenal glands and pancreas are within normal limits. The gallbladder is well distended demonstrates significant wall thickening with pericholecystic inflammatory changes and small dependent  gallstones. These changes are suggestive of acute cholecystitis in the appropriate clinical setting. Nonobstructing renal calculi are noted. A large right renal cyst is seen measuring 4.5 cm.  The colon demonstrates diffuse diverticular change. No diverticulitis or abscess is identified. The bladder is well distended. The uterus has been surgically removed. The bony structures are within normal limits. There are changes consistent with an IVC filter.  IMPRESSION: Changes suggestive of acute cholecystitis with gallstones. Ultrasound may be helpful for further evaluation. Correlation with physical exam is recommended as well.  Right renal cyst.  No other focal abnormality is seen.   Electronically Signed   By: Inez Catalina M.D.   On: 08/25/2014 12:54   Dg Chest 1 View  08/25/2014   CLINICAL DATA:  Weakness  EXAM: CHEST  1 VIEW  COMPARISON:  03/11/2014  FINDINGS: Cardiac shadow is mildly enlarged but stable. Diffuse interstitial changes are again identified and stable from the prior exam. Multiple old left rib fractures are again seen. The previously noted hiatal hernia is less well appreciated on the current exam.  IMPRESSION: No acute abnormality noted.   Electronically Signed   By: Inez Catalina M.D.   On: 08/25/2014 11:41   Ct Head Wo Contrast  08/25/2014   CLINICAL DATA:  Recent change in mental status with weakness  EXAM: CT HEAD WITHOUT CONTRAST  TECHNIQUE: Contiguous axial images were obtained from the base of the skull through the vertex without intravenous contrast.  COMPARISON:  03/11/2014  FINDINGS: Bony calvarium again demonstrates postoperative changes on the right. Diffuse atrophic changes are noted. Some areas of encephalomalacia related to the previous surgery are noted. Chronic white matter ischemic changes noted as well. No findings to suggest acute hemorrhage, acute infarction or space-occupying mass lesion are noted. An old lacunar infarct is noted within the basal ganglia on the left.   IMPRESSION: Chronic atrophic and ischemic changes.  No acute abnormality noted.   Electronically Signed   By: Inez Catalina M.D.   On: 08/25/2014 11:34   US Abdomen Complete  08/25/2014   CLINICAL DATA:  82 year old female with a history of fever and weakness. CT of the same date demonstrates changes of potential acute cholecystitis.  EXAM: ULTRASOUND ABDOMEN COMPLETE  COMPARISON:  CT 08/25/2014  FINDINGS: Gallbladder: Gallbladder distended with thickened wall measuring greater than 5 mm. Echogenic material at the fundus of the gallbladder layer dependently. No pericholecystic fluid. Sonographic Percell Miller sign is reported negative.  Common bile duct: Diameter: 3.1 mm  Liver: No focal lesion identified. Within normal limits in parenchymal echogenicity.  IVC: No abnormality visualized.  Pancreas: Visualized portion unremarkable.  Spleen: Size and appearance within normal limits.  Right Kidney: Length: 10.2 cm. Echogenicity of the right kidney similar to that of the adjacent liver. No hydronephrosis. Anechoic cystic structure measuring 5.2 cm x 4.4 cm x 5.0  cm, compatible with Bosniak 1 cyst. Nonobstructive stone which is present on the comparison CT is not visualized  Left Kidney: Length: 8.0 cm. Echogenicity of the left kidney similar to that of the adjacent spleen. No evidence of hydronephrosis.  Abdominal aorta: No aneurysm visualized.  Other findings: None.  IMPRESSION: Sonographic findings of the gallbladder are suspicious for acute cholecystitis, however are equivocal, as the sonographic Percell Miller sign is reportedly negative. There is suspicious gallbladder wall thickening and echogenic debris/stones within the gallbladder. If there is need to evaluate for ductal obstruction, nuclear medicine HIDA study would be recommended.  Signed,  Dulcy Fanny. Earleen Newport, DO  [facility]  [facility]   Electronically Signed   By: Corrie Mckusick D.O.   On: 08/25/2014 15:33   Nm Hepato  W/eject Fract  08/28/2014   CLINICAL DATA:  RIGHT upper quadrant abdominal pain, abnormal gallbladder by ultrasound showing gallstones, distension and thickened wall  EXAM: NUCLEAR MEDICINE HEPATOBILIARY IMAGING  TECHNIQUE: Sequential images of the abdomen were obtained out to 60 minutes following intravenous administration of radiopharmaceutical.  RADIOPHARMACEUTICALS:  5.4 mCi Tc-49m Choletec IV  Pharmaceutical:  2.6 mg morphine sulfate IV  COMPARISON:  Ultrasound abdomen 08/25/2014  FINDINGS: Normal tracer extraction by bloodstream.  Prolonged hepatocellular retention of tracer indicating a degree of hepatocellular dysfunction.  Excretion of tracer into the biliary tree is identified with bowel visualized by 18 minutes.  At 36 minutes gallbladder had not visualized.  Patient then received morphine IV and imaging was continued for an additional 30 minutes.  Gallbladder failed to visualized following morphine augmentation.  IMPRESSION: Patent common hepatic duct and common bile duct with passage of tracer into small bowel.  Hepatocellular dysfunction evidenced by prolonged hepatic retention of tracer.  Nonvisualization of the gallbladder despite morphine augmentation consistent with acute cholecystitis; please note that false-positive exams can occur however in patients with prolonged fasting.   Electronically Signed   By: Lavonia Dana M.D.   On: 08/28/2014 16:51   Dg Chest Port 1 View  08/28/2014   CLINICAL DATA:  Shortness of breath, urinary tract infection and sepsis, previous CVA, chronic bronchitis.  EXAM: PORTABLE CHEST - 1 VIEW  COMPARISON:  Portable chest x-ray of Aug 25, 2014  FINDINGS: The lungs are mildly hypoinflated. The pulmonary interstitial markings are more conspicuous today especially at the right lung base. The cardiac silhouette remains enlarged. The central pulmonary vascularity is engorged. The bony thorax exhibits no acute abnormality.  IMPRESSION: Mild hypo inflation which accentuates the  lung markings. However, low-grade CHF is suspected. Interval development of subsegmental atelectasis at the right lung base is suspected. If the patient can tolerate the procedure, a PA and lateral chest x-ray would be useful.   Electronically Signed   By: David  Martinique M.D.   On: 08/28/2014 12:03    Labs:  CBC:  Recent Labs  08/26/14 1030 08/27/14 0417 08/28/14 0503 08/29/14 0433  WBC 25.4* 22.8* 24.9* 19.4*  HGB 9.4* 8.6* 9.3* 9.1*  HCT 28.8* 26.6* 28.4* 28.2*  PLT 184 180 186 185    COAGS:  Recent Labs  08/25/14 2000  INR 1.41  APTT 32    BMP:  Recent Labs  08/26/14 1030 08/27/14 0417 08/28/14 0503 08/29/14 0433  NA 143 145 145 147*  K 3.7 3.1* 3.7 3.6  CL 112* 116* 115* 116*  CO2 24 22 24 23   GLUCOSE 100* 442* 189* 108*  BUN 31* 23* 17 17  CALCIUM 6.9* 6.5* 7.3* 7.7*  CREATININE 1.94* 1.53* 1.47* 1.73*  GFRNONAA 22* 29* 31* 25*  GFRAA 26* 34* 36* 29*    LIVER FUNCTION TESTS:  Recent Labs  08/25/14 1005 08/26/14 1030 08/29/14 0433  BILITOT 0.7 0.4 0.6  AST 36 42* 32  ALT 62* 54 34  ALKPHOS 69 87 207*  PROT 7.2 5.6* 5.6*  ALBUMIN 3.3* 2.4* 2.1*    Assessment and Plan: Acute cholecystitis, s/p HIDA  Sepsis, enterococcus  History of CVA Request for image guided percutaneous cholecystostomy tube, patient has been seen by surgery and is too high risk to undergo cholecystectomy  The patient has been NPO, no blood thinners taken, labs and vitals have been reviewed. Risks and Benefits discussed with the patient's daughter including, but not limited to bleeding, infection, gallbladder perforation, bile leak, sepsis or even death. All questions were answered, patient is agreeable to proceed. Consent signed and in chart.   Thank you for this interesting consult.  I greatly enjoyed meeting Tina Hunter and look forward to participating in their care.  SignedHedy Jacob 08/29/2014, 2:10 PM   I spent a total of 20 Minutes in face to face in  clinical consultation, greater than 50% of which was counseling/coordinating care for acute cholecystitis.

## 2014-08-29 NOTE — Procedures (Signed)
[place] and fluoroscopic guided cholecystostomy tube placement.  10 French drain placed.  Yellow purulent fluid removed.  Gallbladder was decompressed after drain placement.  No immediate complication.  Minimal blood loss.

## 2014-08-29 NOTE — Progress Notes (Signed)
ANTIBIOTIC CONSULT NOTE  Pharmacy Consult for Vancomycin Indication: rule out sepsis  Allergies  Allergen Reactions  . Codeine Other (See Comments)    Unknown reaction   Patient Measurements: Height: 5\' 4"  (162.6 cm) Weight: 143 lb (64.864 kg) IBW/kg (Calculated) : 54.7  Vital Signs: Temp: 98.4 F (36.9 C) (05/20 1200) Temp Source: Oral (05/20 0800) BP: 166/62 mmHg (05/20 1030) Pulse Rate: 92 (05/20 1030) Intake/Output from previous day: 05/19 0701 - 05/20 0700 In: 1075 [I.V.:825; IV Piggyback:250] Out: 950 [Urine:950] Intake/Output from this shift: Total I/O In: 900 [I.V.:900] Out: 1200 [Urine:1200]  Labs:  Recent Labs  08/27/14 0417 08/28/14 0503 08/29/14 0433  WBC 22.8* 24.9* 19.4*  HGB 8.6* 9.3* 9.1*  PLT 180 186 185  CREATININE 1.53* 1.47* 1.73*   Estimated Creatinine Clearance: 19.8 mL/min (by C-G formula based on Cr of 1.73). No results for input(s): VANCOTROUGH, VANCOPEAK, VANCORANDOM, GENTTROUGH, GENTPEAK, GENTRANDOM, TOBRATROUGH, TOBRAPEAK, TOBRARND, AMIKACINPEAK, AMIKACINTROU, AMIKACIN in the last 72 hours.   Microbiology: Recent Results (from the past 720 hour(s))  Culture, Urine     Status: None   Collection Time: 08/25/14 10:18 AM  Result Value Ref Range Status   Specimen Description URINE, CATHETERIZED  Final   Special Requests NONE  Final   Colony Count   Final    >=100,000 COLONIES/ML Performed at [facility]    Culture   Final    [place] D;high probability for S.bovis Performed at [facility]    Report Status 08/28/2014 FINAL  Final   Organism ID, Bacteria ESCHERICHIA COLI  Final   Organism ID, Bacteria STREPTOCOCCUS GROUP D;high probability for S.bovis  Final      Susceptibility   Escherichia coli - MIC*    CEFAZOLIN <=4 SENSITIVE Sensitive     CEFTRIAXONE <=1 SENSITIVE Sensitive     CIPROFLOXACIN >=4 RESISTANT Resistant     GENTAMICIN <=1 SENSITIVE Sensitive     LEVOFLOXACIN >=8  RESISTANT Resistant     NITROFURANTOIN 64 INTERMEDIATE Intermediate     TOBRAMYCIN <=1 SENSITIVE Sensitive     * ESCHERICHIA COLI   Streptococcus group d;high probability for s.bovis - MIC (ETEST)*    PENICILLIN 0.125 SENSITIVE Sensitive     * STREPTOCOCCUS GROUP D;high probability for S.bovis  Blood culture (routine x 2)     Status: None (Preliminary result)   Collection Time: 08/25/14 11:09 AM  Result Value Ref Range Status   Specimen Description BLOOD RIGHT HAND  Final   Special Requests BOTTLES DRAWN AEROBIC ONLY 4CC  Final   Culture NO GROWTH 4 DAYS  Final   Report Status PENDING  Incomplete  MRSA PCR Screening     Status: None   Collection Time: 08/25/14  1:20 PM  Result Value Ref Range Status   MRSA by PCR NEGATIVE NEGATIVE Final    Comment:        The GeneXpert MRSA Assay (FDA approved for NASAL specimens only), is one component of a comprehensive MRSA colonization surveillance program. It is not intended to diagnose MRSA infection nor to guide or monitor treatment for MRSA infections.   Culture, blood (x 2)     Status: None (Preliminary result)   Collection Time: 08/25/14  8:00 PM  Result Value Ref Range Status   Specimen Description BLOOD PICC LINE DRAWN BY RN RL  Final   Special Requests BOTTLES DRAWN AEROBIC AND ANAEROBIC 6CC  Final   Culture   Final    ENTEROCOCCUS SPECIES Note: Performed at  [facility] Performed at [facility]    Report Status PENDING  Incomplete  Culture, blood (routine x 2)     Status: None (Preliminary result)   Collection Time: 08/28/14 10:00 AM  Result Value Ref Range Status   Specimen Description BLOOD PORTA CATH DRAWN BY RN  Final   Special Requests   Final    BOTTLES DRAWN AEROBIC AND ANAEROBIC AEB=8CC ANA=6CC   Culture NO GROWTH 1 DAY  Final   Report Status PENDING  Incomplete  Culture, blood (routine x 2)     Status: None (Preliminary result)   Collection Time: 08/28/14 10:18 AM  Result Value Ref Range Status    Specimen Description BLOOD LEFT HAND  Final   Special Requests BOTTLES DRAWN AEROBIC AND ANAEROBIC 6CC  Final   Culture NO GROWTH 1 DAY  Final   Report Status PENDING  Incomplete    Anti-infectives    Start     Dose/Rate Route Frequency Ordered Stop   08/28/14 1100  cefTRIAXone (ROCEPHIN) 1 g in dextrose 5 % 50 mL IVPB     1 g 100 mL/hr over 30 Minutes Intravenous Every 24 hours 08/28/14 0924     08/26/14 1300  ampicillin-sulbactam (UNASYN) 1.5 g in sodium chloride 0.9 % 50 mL IVPB  Status:  Discontinued     1.5 g 100 mL/hr over 30 Minutes Intravenous Every 12 hours 08/26/14 1151 08/28/14 0924   08/26/14 1200  cefTRIAXone (ROCEPHIN) 1 g in dextrose 5 % 50 mL IVPB - Premix  Status:  Discontinued     1 g 100 mL/hr over 30 Minutes Intravenous Every 24 hours 08/25/14 1436 08/25/14 1444   08/26/14 1200  cefTRIAXone (ROCEPHIN) 1 g in dextrose 5 % 50 mL IVPB  Status:  Discontinued     1 g 100 mL/hr over 30 Minutes Intravenous Every 24 hours 08/25/14 1445 08/26/14 1128   08/26/14 1200  vancomycin (VANCOCIN) IVPB 1000 mg/200 mL premix     1,000 mg 200 mL/hr over 60 Minutes Intravenous Every 48 hours 08/26/14 1151     08/25/14 1745  vancomycin (VANCOCIN) IVPB 1000 mg/200 mL premix  Status:  Discontinued     1,000 mg 200 mL/hr over 60 Minutes Intravenous  Once 08/25/14 1738 08/25/14 1747   08/25/14 1745  piperacillin-tazobactam (ZOSYN) IVPB 3.375 g  Status:  Discontinued     3.375 g 12.5 mL/hr over 240 Minutes Intravenous  Once 08/25/14 1738 08/25/14 1748   08/25/14 1415  cefTRIAXone (ROCEPHIN) 2 g in dextrose 5 % 50 mL IVPB - Premix  Status:  Discontinued     2 g 100 mL/hr over 30 Minutes Intravenous  Once 08/25/14 1412 08/25/14 1418   08/25/14 1115  cefTRIAXone (ROCEPHIN) 1 g in dextrose 5 % 50 mL IVPB     1 g 100 mL/hr over 30 Minutes Intravenous  Once 08/25/14 1103 08/25/14 1252     Assessment: 81 yo F admitted with urosepsis & started on IV Rocephin.  This is being broadened with  Vancomycin now.  Scr is worse today.  Cultures pending.   Goal of Therapy:  Vancomycin trough level 15-20 mcg/ml  Eradicate infection.  Plan:  Continue Rocephin 1gm IV q24hrs Vancomycin 1gm IV q48h Check Vancomycin trough at steady state Monitor renal function and cx data   Hart Robinsons A 08/29/2014,2:06 PM

## 2014-08-29 NOTE — Progress Notes (Signed)
Reviewed hepatobiliary scan and liver enzyme tests. Due to positive enterococcus blood culture, one has to consider cholecystitis. Patient is a high risk candidate for general anesthesia, thus agree with proceeding with cholecystostomy tube placement by interventional radiology.

## 2014-08-30 ENCOUNTER — Inpatient Hospital Stay (HOSPITAL_COMMUNITY): Payer: Medicare Other

## 2014-08-30 DIAGNOSIS — R0600 Dyspnea, unspecified: Secondary | ICD-10-CM

## 2014-08-30 LAB — BASIC METABOLIC PANEL
Anion gap: 12 (ref 5–15)
BUN: 24 mg/dL — AB (ref 6–20)
CO2: 22 mmol/L (ref 22–32)
CREATININE: 1.97 mg/dL — AB (ref 0.44–1.00)
Calcium: 8.1 mg/dL — ABNORMAL LOW (ref 8.9–10.3)
Chloride: 113 mmol/L — ABNORMAL HIGH (ref 101–111)
GFR calc Af Amer: 25 mL/min — ABNORMAL LOW (ref >60)
GFR calc non Af Amer: 22 mL/min — ABNORMAL LOW (ref >60)
GLUCOSE: 101 mg/dL — AB (ref 65–99)
POTASSIUM: 3.5 mmol/L (ref 3.5–5.1)
SODIUM: 147 mmol/L — AB (ref 135–145)

## 2014-08-30 LAB — GLUCOSE, CAPILLARY
GLUCOSE-CAPILLARY: 93 mg/dL (ref 65–99)
GLUCOSE-CAPILLARY: 93 mg/dL (ref 65–99)
Glucose-Capillary: 95 mg/dL (ref 65–99)
Glucose-Capillary: 112 mg/dL — ABNORMAL HIGH (ref 65–99)

## 2014-08-30 LAB — CULTURE, BLOOD (ROUTINE X 2): CULTURE: NO GROWTH

## 2014-08-30 LAB — CBC
HEMATOCRIT: 28.3 % — AB (ref 36.0–46.0)
Hemoglobin: 9.3 g/dL — ABNORMAL LOW (ref 12.0–15.0)
MCH: 31.8 pg (ref 26.0–34.0)
MCHC: 32.9 g/dL (ref 30.0–36.0)
MCV: 96.9 fL (ref 78.0–100.0)
PLATELETS: 202 10*3/uL (ref 150–400)
RBC: 2.92 MIL/uL — AB (ref 3.87–5.11)
RDW: 14.5 % (ref 11.5–15.5)
WBC: 14.4 10*3/uL — ABNORMAL HIGH (ref 4.0–10.5)

## 2014-08-30 MED ORDER — LINEZOLID 600 MG/300ML IV SOLN
600.0000 mg | Freq: Two times a day (BID) | INTRAVENOUS | Status: DC
  Administered 2014-08-30 – 2014-09-09 (×21): 600 mg via INTRAVENOUS
  Filled 2014-08-30 (×23): qty 300

## 2014-08-30 MED ORDER — METRONIDAZOLE IN NACL 5-0.79 MG/ML-% IV SOLN
500.0000 mg | Freq: Three times a day (TID) | INTRAVENOUS | Status: DC
  Administered 2014-08-30 – 2014-09-02 (×10): 500 mg via INTRAVENOUS
  Filled 2014-08-30 (×9): qty 100

## 2014-08-30 NOTE — Progress Notes (Addendum)
[facility] PROGRESS NOTE  Tina Hunter ACZ:[id number] DOB: 25-Aug-1924 DOA: 08/25/2014 PCP: No primary care provider on file.  Addendum: Received notification from RN that the blood cultures from 5/16 are growing VRE. Discussed via telephone with Dr. Tommy Medal, as patient remains quite ill we'll treat this as a true infection. We'll discontinue vancomycin and start Zyvox, Rocephin for her Escherichia coli UTI and Flagyl for anaerobic coverage given her acute cholecystitis.  Assessment/Plan: Acute dyspnea -Chest x-ray from 5/20 with evidence for pulmonary edema. -2-D echo done, results pending. -Likely became fluid overloaded with fluids given for sepsis. -Was given 40 mg of Lasix yesterday and put out 2500 mL of urine, however still remains 2.8 L positive since admission. -We'll schedule Lasix daily and try to strive for negative fluid balance., She remains hemodynamically stable.  Sepsis secondary to acute cholecystitis and UTI and enterococcal bacteremia -Urine is growing Escherichia coli as well as group D strep, one out of 2 blood cultures with enterococcus, sensitivities pending. -For now we'll continue vancomycin and Rocephin. -We'll add Flagyl for anaerobic coverage. -Remained afebrile overnight, labs from this morning still pending to follow WBC count.  Acute cholecystitis -IR placed cholecystostomy tube on 5/20. -So far has drained 80 mL of purulent fluid. -Cultures from fluid have been sent and are currently pending.  Enterococcal bacteremia -Continue vancomycin. -Sensitivities pending.  -Question source.  Acute renal failure -Unsure of baseline. -Renal function this a.m. is still in process.  Acute encephalopathy -Secondary to sepsis on top of baseline dementia. -No change, is quite lethargic this morning.  Hypokalemia/hypomagnesemia -Repleted. -Labs from this a.m. pending.  Leukocytosis -Labs pending.  Anemia -Stable, no indication for acute  transfusion.  Diabetes mellitus  -Well-controlled. .  Code Status: Partial code Family Communication: Daughter at bedside updated on plan of care   Disposition Plan: To be determined   Consultants:  None    Antibiotics:  Vancomycin    Rocephin  Flagyl  Subjective: Cholecystostomy tube placed yesterday. Still remains quite lethargic.  Objective: Filed Vitals:   08/30/14 0300 08/30/14 0400 08/30/14 0500 08/30/14 0730  BP: 171/68 160/81 139/75   Pulse: 97 96 93   Temp:   98.9 F (37.2 C) 98.6 F (37 C)  TempSrc:   Axillary Axillary  Resp: 21 20 20    Height:      Weight:      SpO2: 97% 96% 96%     Intake/Output Summary (Last 24 hours) at 08/30/14 0917 Last data filed at 08/30/14 1093  Gross per 24 hour  Intake    250 ml  Output   2600 ml  Net  -2350 ml   Filed Weights   08/25/14 0935  Weight: 64.864 kg (143 lb)    Exam:   General:  Lethargic  Cardiovascular: Regular rate and rhythm  Respiratory: Bilateral crackles and ronchi  Abdomen: Status post cholecystostomy, decreased bowel sounds   Extremities: No clubbing, cyanosis or edema, positive pulses    Neurologic:  Unable to assess given current mental state   Data Reviewed: Basic Metabolic Panel:  Recent Labs Lab 08/25/14 1005 08/26/14 1030 08/27/14 0417 08/28/14 0503 08/29/14 0433  NA 141 143 145 145 147*  K 4.1 3.7 3.1* 3.7 3.6  CL 100* 112* 116* 115* 116*  CO2 29 24 22 24 23   GLUCOSE 208* 100* 442* 189* 108*  BUN 30* 31* 23* 17 17  CREATININE 2.55* 1.94* 1.53* 1.47* 1.73*  CALCIUM 8.6* 6.9* 6.5* 7.3* 7.7*  MG  --   --   --  1.4*  --    Liver Function Tests:  Recent Labs Lab 08/25/14 1005 08/26/14 1030 08/29/14 0433  AST 36 42* 32  ALT 62* 54 34  ALKPHOS 69 87 207*  BILITOT 0.7 0.4 0.6  PROT 7.2 5.6* 5.6*  ALBUMIN 3.3* 2.4* 2.1*   No results for input(s): LIPASE, AMYLASE in the last 168 hours. No results for input(s): AMMONIA in the last 168 hours. CBC:  Recent  Labs Lab 08/25/14 1005 08/25/14 2000 08/26/14 1030 08/27/14 0417 08/28/14 0503 08/29/14 0433  WBC 33.3* 13.0* 25.4* 22.8* 24.9* 19.4*  NEUTROABS 28.6* 12.7*  --  20.2*  --   --   HGB 12.3 10.6* 9.4* 8.6* 9.3* 9.1*  HCT 37.8 32.1* 28.8* 26.6* 28.4* 28.2*  MCV 96.7 95.5 96.3 97.1 96.3 96.6  PLT 265 197 184 180 186 185   Cardiac Enzymes:  Recent Labs Lab 08/25/14 1005 08/25/14 1813 08/26/14 0425  TROPONINI <0.03 0.03 0.05*   BNP (last 3 results) No results for input(s): BNP in the last 8760 hours.  ProBNP (last 3 results) No results for input(s): PROBNP in the last 8760 hours.  CBG:  Recent Labs Lab 08/29/14 0720 08/29/14 1150 08/29/14 1650 08/29/14 2036 08/30/14 0727  GLUCAP 87 96 87 84 93    Recent Results (from the past 240 hour(s))  Culture, Urine     Status: None   Collection Time: 08/25/14 10:18 AM  Result Value Ref Range Status   Specimen Description URINE, CATHETERIZED  Final   Special Requests NONE  Final   Colony Count   Final    >=100,000 COLONIES/ML Performed at [facility]    Culture   Final    [place] D;high probability for S.bovis Performed at [facility]    Report Status 08/28/2014 FINAL  Final   Organism ID, Bacteria ESCHERICHIA COLI  Final   Organism ID, Bacteria STREPTOCOCCUS GROUP D;high probability for S.bovis  Final      Susceptibility   Escherichia coli - MIC*    CEFAZOLIN <=4 SENSITIVE Sensitive     CEFTRIAXONE <=1 SENSITIVE Sensitive     CIPROFLOXACIN >=4 RESISTANT Resistant     GENTAMICIN <=1 SENSITIVE Sensitive     LEVOFLOXACIN >=8 RESISTANT Resistant     NITROFURANTOIN 64 INTERMEDIATE Intermediate     TOBRAMYCIN <=1 SENSITIVE Sensitive     * ESCHERICHIA COLI   Streptococcus group d;high probability for s.bovis - MIC (ETEST)*    PENICILLIN 0.125 SENSITIVE Sensitive     * STREPTOCOCCUS GROUP D;high probability for S.bovis  Blood culture (routine x 2)     Status: None  (Preliminary result)   Collection Time: 08/25/14 11:09 AM  Result Value Ref Range Status   Specimen Description BLOOD RIGHT HAND  Final   Special Requests BOTTLES DRAWN AEROBIC ONLY 4CC  Final   Culture NO GROWTH 4 DAYS  Final   Report Status PENDING  Incomplete  MRSA PCR Screening     Status: None   Collection Time: 08/25/14  1:20 PM  Result Value Ref Range Status   MRSA by PCR NEGATIVE NEGATIVE Final    Comment:        The GeneXpert MRSA Assay (FDA approved for NASAL specimens only), is one component of a comprehensive MRSA colonization surveillance program. It is not intended to diagnose MRSA infection nor to guide or monitor treatment for MRSA infections.   Culture, blood (x 2)     Status: None (Preliminary result)   Collection Time:  08/25/14  8:00 PM  Result Value Ref Range Status   Specimen Description BLOOD PICC LINE DRAWN BY RN RL  Final   Special Requests BOTTLES DRAWN AEROBIC AND ANAEROBIC [place]  Final   Culture   Final    ENTEROCOCCUS SPECIES Note: Performed at [facility] Performed at [facility]    Report Status PENDING  Incomplete  Culture, blood (routine x 2)     Status: None (Preliminary result)   Collection Time: 08/28/14 10:00 AM  Result Value Ref Range Status   Specimen Description BLOOD PORTA CATH DRAWN BY RN  Final   Special Requests   Final    BOTTLES DRAWN AEROBIC AND ANAEROBIC AEB=8CC ANA=6CC   Culture NO GROWTH 1 DAY  Final   Report Status PENDING  Incomplete  Culture, blood (routine x 2)     Status: None (Preliminary result)   Collection Time: 08/28/14 10:18 AM  Result Value Ref Range Status   Specimen Description BLOOD LEFT HAND  Final   Special Requests BOTTLES DRAWN AEROBIC AND ANAEROBIC 6CC  Final   Culture NO GROWTH 1 DAY  Final   Report Status PENDING  Incomplete     Studies: Nm Hepato W/eject Fract  08/28/2014   CLINICAL DATA:  RIGHT upper quadrant abdominal pain, abnormal gallbladder by ultrasound showing gallstones,  distension and thickened wall  EXAM: NUCLEAR MEDICINE HEPATOBILIARY IMAGING  TECHNIQUE: Sequential images of the abdomen were obtained out to 60 minutes following intravenous administration of radiopharmaceutical.  RADIOPHARMACEUTICALS:  5.4 mCi Tc-36m Choletec IV  Pharmaceutical:  2.6 mg morphine sulfate IV  COMPARISON:  Ultrasound abdomen 08/25/2014  FINDINGS: Normal tracer extraction by bloodstream.  Prolonged hepatocellular retention of tracer indicating a degree of hepatocellular dysfunction.  Excretion of tracer into the biliary tree is identified with bowel visualized by 18 minutes.  At 36 minutes gallbladder had not visualized.  Patient then received morphine IV and imaging was continued for an additional 30 minutes.  Gallbladder failed to visualized following morphine augmentation.  IMPRESSION: Patent common hepatic duct and common bile duct with passage of tracer into small bowel.  Hepatocellular dysfunction evidenced by prolonged hepatic retention of tracer.  Nonvisualization of the gallbladder despite morphine augmentation consistent with acute cholecystitis; please note that false-positive exams can occur however in patients with prolonged fasting.   Electronically Signed   By: Lavonia Dana M.D.   On: 08/28/2014 16:51   Ir Perc Cholecystostomy  08/29/2014   CLINICAL DATA:  81 year old with sepsis and acute cholecystitis.  EXAM: ULTRASOUND AND FLUOROSCOPIC GUIDED PERCUTANEOUS CHOLECYSTOSTOMY TUBE PLACEMENT  Physician: Stephan Minister. Henn, MD  FLUOROSCOPY TIME:  54 seconds, 16 mGy  MEDICATIONS: 0.5 mg Versed.  25 mcg fentanyl  ANESTHESIA/SEDATION: 11 minutes  PROCEDURE: Informed consent was obtained from the patient's daughter. Patient was placed supine on the interventional table. Patient's head was slightly elevated in order to improve breathing. Ultrasound was performed of the right upper quadrant. The right upper abdomen was prepped and draped in sterile fashion. Maximal barrier sterile technique was  utilized including caps, mask, sterile gowns, sterile gloves, sterile drape, hand hygiene and skin antiseptic. Skin was anesthetized with 1% lidocaine. A 22 gauge Chiba needle was directed into the gallbladder through the right hepatic lobe with ultrasound guidance. Needle position was confirmed within the gallbladder. Yellow-green purulent fluid was aspirated. A 0.018 wire was placed. Accustick dilator set was placed. The tract was dilated to accommodate a 10.2 [place] multipurpose drain. Approximately 30 mL of thick yellow purulent  fluid was aspirated. Catheter was sutured to the skin. Ultrasound of the right upper quadrant demonstrated decompression of the gallbladder. Catheter was sutured to the skin and attached to gravity bag. Fluoroscopic and ultrasound images were taken and saved for documentation. Fluid was sent for culture.  FINDINGS: There was a thickening gallbladder with echogenic stones or sludge. Needle position confirmed within the gallbladder. Approximately 30 mL of purulent fluid was removed from the gallbladder. The gallbladder was decompressed at the end of the procedure.  Estimated blood loss: Minimal  COMPLICATIONS: None  IMPRESSION: Successful ultrasound and fluoroscopic guided cholecystostomy tube placement.   Electronically Signed   By: Markus Daft M.D.   On: 08/29/2014 18:31   Dg Chest Port 1 View  08/28/2014   CLINICAL DATA:  Shortness of breath, urinary tract infection and sepsis, previous CVA, chronic bronchitis.  EXAM: PORTABLE CHEST - 1 VIEW  COMPARISON:  Portable chest x-ray of Aug 25, 2014  FINDINGS: The lungs are mildly hypoinflated. The pulmonary interstitial markings are more conspicuous today especially at the right lung base. The cardiac silhouette remains enlarged. The central pulmonary vascularity is engorged. The bony thorax exhibits no acute abnormality.  IMPRESSION: Mild hypo inflation which accentuates the lung markings. However, low-grade CHF is suspected. Interval  development of subsegmental atelectasis at the right lung base is suspected. If the patient can tolerate the procedure, a PA and lateral chest x-ray would be useful.   Electronically Signed   By: David  Martinique M.D.   On: 08/28/2014 12:03    Scheduled Meds: . antiseptic oral rinse  7 mL Mouth Rinse BID  . cefTRIAXone (ROCEPHIN)  IV  1 g Intravenous Q24H  . heparin  5,000 Units Subcutaneous 3 times per day  . insulin aspart  0-5 Units Subcutaneous QHS  . insulin aspart  0-9 Units Subcutaneous TID WC  . levETIRAcetam  500 mg Intravenous Q12H  . levothyroxine  25 mcg Intravenous Daily  . metoprolol  5 mg Intravenous 4 times per day  . vancomycin  1,000 mg Intravenous Q48H   Continuous Infusions:   Principal Problem:   Sepsis Active Problems:   Weakness generalized   CVA, old, hemiparesis   UTI (lower urinary tract infection)   Seizure disorder   Hypertension   Acute renal failure   Dementia   Cholecystitis, acute   Seizures   Leukocytosis   Acidosis   Sepsis secondary to UTI    Time spent: 45 minutes. Greater than 50% of this time was spent in direct contact with the patient coordinating care.    Lelon Frohlich  [facility] Pager [phone]  If 7PM-7AM, please contact night-coverage at [email or web address], password [facility] 08/30/2014, 9:17 AM  LOS: 5 days

## 2014-08-30 NOTE — Progress Notes (Signed)
  Echocardiogram 2D Echocardiogram has been performed.  Tina Hunter 08/30/2014, 2:05 PM

## 2014-08-31 LAB — BASIC METABOLIC PANEL
Anion gap: 8 (ref 5–15)
BUN: 22 mg/dL — AB (ref 6–20)
CO2: 26 mmol/L (ref 22–32)
Calcium: 8 mg/dL — ABNORMAL LOW (ref 8.9–10.3)
Chloride: 112 mmol/L — ABNORMAL HIGH (ref 101–111)
Creatinine, Ser: 1.77 mg/dL — ABNORMAL HIGH (ref 0.44–1.00)
GFR, EST AFRICAN AMERICAN: 29 mL/min — AB (ref >60)
GFR, EST NON AFRICAN AMERICAN: 25 mL/min — AB (ref >60)
Glucose, Bld: 121 mg/dL — ABNORMAL HIGH (ref 65–99)
POTASSIUM: 3.1 mmol/L — AB (ref 3.5–5.1)
SODIUM: 146 mmol/L — AB (ref 135–145)

## 2014-08-31 LAB — CBC
HEMATOCRIT: 26.6 % — AB (ref 36.0–46.0)
Hemoglobin: 8.6 g/dL — ABNORMAL LOW (ref 12.0–15.0)
MCH: 31.3 pg (ref 26.0–34.0)
MCHC: 32.3 g/dL (ref 30.0–36.0)
MCV: 96.7 fL (ref 78.0–100.0)
Platelets: 218 10*3/uL (ref 150–400)
RBC: 2.75 MIL/uL — ABNORMAL LOW (ref 3.87–5.11)
RDW: 14.4 % (ref 11.5–15.5)
WBC: 12.9 10*3/uL — ABNORMAL HIGH (ref 4.0–10.5)

## 2014-08-31 LAB — GLUCOSE, CAPILLARY
GLUCOSE-CAPILLARY: 110 mg/dL — AB (ref 65–99)
GLUCOSE-CAPILLARY: 126 mg/dL — AB (ref 65–99)
Glucose-Capillary: 134 mg/dL — ABNORMAL HIGH (ref 65–99)
Glucose-Capillary: 116 mg/dL — ABNORMAL HIGH (ref 65–99)

## 2014-08-31 LAB — MAGNESIUM: MAGNESIUM: 1.5 mg/dL — AB (ref 1.7–2.4)

## 2014-08-31 MED ORDER — DEXTROSE 5 % IV SOLN
INTRAVENOUS | Status: DC
  Administered 2014-08-31 – 2014-09-02 (×3): via INTRAVENOUS

## 2014-08-31 MED ORDER — POTASSIUM CHLORIDE 10 MEQ/100ML IV SOLN
10.0000 meq | INTRAVENOUS | Status: AC
  Administered 2014-08-31 (×4): 10 meq via INTRAVENOUS
  Filled 2014-08-31 (×4): qty 100

## 2014-08-31 NOTE — Progress Notes (Signed)
[facility] PROGRESS NOTE  Tina Hunter UYQ:[id number] DOB: 1924-07-02 DOA: 08/25/2014 PCP: No primary care provider on file.  Assessment/Plan: Sepsis secondary to acute cholecystitis, UTI and enterococcal bacteremia -Urine growing Escherichia coli as well as group D strep, one out of 2 blood cultures with VRE, fluid from cholecystostomy tube is growing gram-positive cocci and gram-negative rods. -IR placed cholecystostomy tube on 5/20, as patient was deemed too sick to undergo cholecystectomy. -Remains on linezolid, Rocephin and Flagyl, antibiotic regimen has been discussed with infectious diseases. -Afebrile overnight, WBC count decreasing.  Mild acute diastolic CHF -X-ray with signs of pulmonary edema, echo performed showing ejection fraction of 65-70% with grade 1 diastolic dysfunction. -Is 500 mL negative overnight. -Continue Lasix.  Acute renal failure -Unsure of baseline creatinine -Renal function improved this morning. X  Acute encephalopathy -Secondary to sepsis on top of baseline dementia. -She still is quite lethargic today.  Hypokalemia/hypomagnesemia -We'll replete potassium IV, check magnesium level and replete as necessary.  Hypernatremia  -Due to free water deficit. -We'll start D5 at 50 mL an hour, mindful of diastolic congestive heart failure.  Nutrition -We are nearing the point where we need to consider enteral versus parenteral nutrition if her mental status does not improve. -Discussed with both daughters and granddaughter at bedside that if her mental status improves in the next 24-48 hours we will proceed with swallowing evaluation, if not, will need to consider alternate source of nutrition if plans are to aggressively continue treating her.  Diabetes mellitus -Well-controlled.   Code Status: Partial code Family Communication: discussed with both daughters and granddaughter at bedside  Disposition Plan: to be determined, keep in ICU  today   Consultants:  none   Antibiotics:  Zyvox  Rocephin  Flagyl   Subjective: Still remains quite lethargic today  Objective: Filed Vitals:   08/31/14 0700 08/31/14 0730 08/31/14 0800 08/31/14 0804  BP: 154/61 151/53 159/59   Pulse: 83 72 71   Temp:    98.4 F (36.9 C)  TempSrc:    Axillary  Resp: 21 19 19    Height:      Weight:      SpO2: 99% 96% 99%     Intake/Output Summary (Last 24 hours) at 08/31/14 0936 Last data filed at 08/31/14 0601  Gross per 24 hour  Intake   1150 ml  Output   1115 ml  Net     35 ml   Filed Weights   08/25/14 0935  Weight: 64.864 kg (143 lb)    Exam:   General:  lethargic  Cardiovascular: regular rate and rhythm  Respiratory: fair air movement, mild rhonchi  Abdomen: soft, nontender, nondistended, decreased bowel sounds, cholecystostomy tube in place  Extremities: trace bilateral pitting edema   Neurologic:  Moves all 4 spontaneously, has residual left hemiparesis  Data Reviewed: Basic Metabolic Panel:  Recent Labs Lab 08/27/14 0417 08/28/14 0503 08/29/14 0433 08/30/14 0915 08/31/14 0601  NA 145 145 147* 147* 146*  K 3.1* 3.7 3.6 3.5 3.1*  CL 116* 115* 116* 113* 112*  CO2 22 24 23 22 26   GLUCOSE 442* 189* 108* 101* 121*  BUN 23* 17 17 24* 22*  CREATININE 1.53* 1.47* 1.73* 1.97* 1.77*  CALCIUM 6.5* 7.3* 7.7* 8.1* 8.0*  MG  --  1.4*  --   --   --    Liver Function Tests:  Recent Labs Lab 08/25/14 1005 08/26/14 1030 08/29/14 0433  AST 36 42* 32  ALT 62* 54 34  ALKPHOS 69 87 207*  BILITOT 0.7 0.4 0.6  PROT 7.2 5.6* 5.6*  ALBUMIN 3.3* 2.4* 2.1*   No results for input(s): LIPASE, AMYLASE in the last 168 hours. No results for input(s): AMMONIA in the last 168 hours. CBC:  Recent Labs Lab 08/25/14 1005 08/25/14 2000  08/27/14 0417 08/28/14 0503 08/29/14 0433 08/30/14 0915 08/31/14 0601  WBC 33.3* 13.0*  < > 22.8* 24.9* 19.4* 14.4* 12.9*  NEUTROABS 28.6* 12.7*  --  20.2*  --   --   --    --   HGB 12.3 10.6*  < > 8.6* 9.3* 9.1* 9.3* 8.6*  HCT 37.8 32.1*  < > 26.6* 28.4* 28.2* 28.3* 26.6*  MCV 96.7 95.5  < > 97.1 96.3 96.6 96.9 96.7  PLT 265 197  < > 180 186 185 202 218  < > = values in this interval not displayed. Cardiac Enzymes:  Recent Labs Lab 08/25/14 1005 08/25/14 1813 08/26/14 0425  TROPONINI <0.03 0.03 0.05*   BNP (last 3 results) No results for input(s): BNP in the last 8760 hours.  ProBNP (last 3 results) No results for input(s): PROBNP in the last 8760 hours.  CBG:  Recent Labs Lab 08/30/14 0727 08/30/14 1124 08/30/14 1653 08/30/14 2110 08/31/14 0719  GLUCAP 93 95 112* 93 110*    Recent Results (from the past 240 hour(s))  Culture, Urine     Status: None   Collection Time: 08/25/14 10:18 AM  Result Value Ref Range Status   Specimen Description URINE, CATHETERIZED  Final   Special Requests NONE  Final   Colony Count   Final    >=100,000 COLONIES/ML Performed at [facility]    Culture   Final    [place] D;high probability for S.bovis Performed at [facility]    Report Status 08/28/2014 FINAL  Final   Organism ID, Bacteria ESCHERICHIA COLI  Final   Organism ID, Bacteria STREPTOCOCCUS GROUP D;high probability for S.bovis  Final      Susceptibility   Escherichia coli - MIC*    CEFAZOLIN <=4 SENSITIVE Sensitive     CEFTRIAXONE <=1 SENSITIVE Sensitive     CIPROFLOXACIN >=4 RESISTANT Resistant     GENTAMICIN <=1 SENSITIVE Sensitive     LEVOFLOXACIN >=8 RESISTANT Resistant     NITROFURANTOIN 64 INTERMEDIATE Intermediate     TOBRAMYCIN <=1 SENSITIVE Sensitive     * ESCHERICHIA COLI   Streptococcus group d;high probability for s.bovis - MIC (ETEST)*    PENICILLIN 0.125 SENSITIVE Sensitive     * STREPTOCOCCUS GROUP D;high probability for S.bovis  Blood culture (routine x 2)     Status: None   Collection Time: 08/25/14 11:09 AM  Result Value Ref Range Status   Specimen Description BLOOD  RIGHT HAND  Final   Special Requests BOTTLES DRAWN AEROBIC ONLY 4CC  Final   Culture NO GROWTH 5 DAYS  Final   Report Status 08/30/2014 FINAL  Final  MRSA PCR Screening     Status: None   Collection Time: 08/25/14  1:20 PM  Result Value Ref Range Status   MRSA by PCR NEGATIVE NEGATIVE Final    Comment:        The GeneXpert MRSA Assay (FDA approved for NASAL specimens only), is one component of a comprehensive MRSA colonization surveillance program. It is not intended to diagnose MRSA infection nor to guide or monitor treatment for MRSA infections.   Culture, blood (x 2)     Status: None  Collection Time: 08/25/14  8:00 PM  Result Value Ref Range Status   Specimen Description BLOOD PICC LINE DRAWN BY RN RL  Final   Special Requests BOTTLES DRAWN AEROBIC AND ANAEROBIC 6CC  Final   Culture   Final    VANCOMYCIN RESISTANT ENTEROCOCCUS ISOLATED Note: CRITICAL RESULT CALLED TO, READ BACK BY AND VERIFIED WITH: MISTY MCDANIEL @ 9:24 AM 08/30/14 BY DWEEKS Performed at [facility]    Report Status 08/30/2014 FINAL  Final   Organism ID, Bacteria VANCOMYCIN RESISTANT ENTEROCOCCUS ISOLATED  Final      Susceptibility   Vancomycin resistant enterococcus isolated - MIC*    AMPICILLIN >=32 RESISTANT Resistant     VANCOMYCIN >256 RESISTANT Resistant     TETRACYCLINE <=1 SENSITIVE Sensitive     LINEZOLID 2 SENSITIVE Sensitive     * VANCOMYCIN RESISTANT ENTEROCOCCUS ISOLATED  Culture, blood (routine x 2)     Status: None (Preliminary result)   Collection Time: 08/28/14 10:00 AM  Result Value Ref Range Status   Specimen Description BLOOD PORTA CATH DRAWN BY RN  Final   Special Requests   Final    BOTTLES DRAWN AEROBIC AND ANAEROBIC AEB=8CC ANA=6CC   Culture NO GROWTH 3 DAYS  Final   Report Status PENDING  Incomplete  Culture, blood (routine x 2)     Status: None (Preliminary result)   Collection Time: 08/28/14 10:18 AM  Result Value Ref Range Status   Specimen Description BLOOD  LEFT HAND  Final   Special Requests BOTTLES DRAWN AEROBIC AND ANAEROBIC 6CC  Final   Culture NO GROWTH 3 DAYS  Final   Report Status PENDING  Incomplete  Body fluid culture     Status: None (Preliminary result)   Collection Time: 08/29/14  2:55 PM  Result Value Ref Range Status   Specimen Description GALL BLADDER CHOLECYSTOSTOMY TUBE  Final   Special Requests NONE  Final   Gram Stain   Final    NO WBC SEEN FEW GRAM POSITIVE COCCI IN PAIRS IN CLUSTERS RARE GRAM NEGATIVE RODS Gram Stain Report Called to,Read Back By and Verified With: Gram Stain Report Called to,Read Back By and Verified With: MISTY MCDANIEL AT 66 ON [id number] BY WEBSP Performed at [facility]    Culture   Final    Culture reincubated for better growth Performed at [facility]    Report Status PENDING  Incomplete     Studies: Ir Perc Cholecystostomy  08/29/2014   CLINICAL DATA:  81 year old with sepsis and acute cholecystitis.  EXAM: ULTRASOUND AND FLUOROSCOPIC GUIDED PERCUTANEOUS CHOLECYSTOSTOMY TUBE PLACEMENT  Physician: Stephan Minister. Henn, MD  FLUOROSCOPY TIME:  54 seconds, 16 mGy  MEDICATIONS: 0.5 mg Versed.  25 mcg fentanyl  ANESTHESIA/SEDATION: 11 minutes  PROCEDURE: Informed consent was obtained from the patient's daughter. Patient was placed supine on the interventional table. Patient's head was slightly elevated in order to improve breathing. Ultrasound was performed of the right upper quadrant. The right upper abdomen was prepped and draped in sterile fashion. Maximal barrier sterile technique was utilized including caps, mask, sterile gowns, sterile gloves, sterile drape, hand hygiene and skin antiseptic. Skin was anesthetized with 1% lidocaine. A 22 gauge Chiba needle was directed into the gallbladder through the right hepatic lobe with ultrasound guidance. Needle position was confirmed within the gallbladder. Yellow-green purulent fluid was aspirated. A 0.018 wire was placed. Accustick dilator set was  placed. The tract was dilated to accommodate a 10.2 [place] multipurpose drain. Approximately 30 mL  of thick yellow purulent fluid was aspirated. Catheter was sutured to the skin. Ultrasound of the right upper quadrant demonstrated decompression of the gallbladder. Catheter was sutured to the skin and attached to gravity bag. Fluoroscopic and ultrasound images were taken and saved for documentation. Fluid was sent for culture.  FINDINGS: There was a thickening gallbladder with echogenic stones or sludge. Needle position confirmed within the gallbladder. Approximately 30 mL of purulent fluid was removed from the gallbladder. The gallbladder was decompressed at the end of the procedure.  Estimated blood loss: Minimal  COMPLICATIONS: None  IMPRESSION: Successful ultrasound and fluoroscopic guided cholecystostomy tube placement.   Electronically Signed   By: Markus Daft M.D.   On: 08/29/2014 18:31    Scheduled Meds: . antiseptic oral rinse  7 mL Mouth Rinse BID  . cefTRIAXone (ROCEPHIN)  IV  1 g Intravenous Q24H  . heparin  5,000 Units Subcutaneous 3 times per day  . insulin aspart  0-5 Units Subcutaneous QHS  . insulin aspart  0-9 Units Subcutaneous TID WC  . levETIRAcetam  500 mg Intravenous Q12H  . levothyroxine  25 mcg Intravenous Daily  . linezolid  600 mg Intravenous Q12H  . metoprolol  5 mg Intravenous 4 times per day  . metronidazole  500 mg Intravenous Q8H  . potassium chloride  10 mEq Intravenous Q1 Hr x 4   Continuous Infusions: . dextrose      Principal Problem:   Sepsis Active Problems:   Weakness generalized   CVA, old, hemiparesis   UTI (lower urinary tract infection)   Seizure disorder   Hypertension   Acute renal failure   Dementia   Cholecystitis, acute   Seizures   Leukocytosis   Acidosis   Sepsis secondary to UTI    Time spent: 40 minutes. Greater than 50% of this time was spent in direct contact with the patient coordinating care.    Lelon Frohlich  [facility] Pager [phone]  If 7PM-7AM, please contact night-coverage at [email or web address], password [facility] 08/31/2014, 9:36 AM  LOS: 6 days

## 2014-09-01 LAB — CBC
HEMATOCRIT: 25.5 % — AB (ref 36.0–46.0)
HEMOGLOBIN: 8.2 g/dL — AB (ref 12.0–15.0)
MCH: 31.3 pg (ref 26.0–34.0)
MCHC: 32.2 g/dL (ref 30.0–36.0)
MCV: 97.3 fL (ref 78.0–100.0)
PLATELETS: 231 10*3/uL (ref 150–400)
RBC: 2.62 MIL/uL — ABNORMAL LOW (ref 3.87–5.11)
RDW: 13.6 % (ref 11.5–15.5)
WBC: 12.2 10*3/uL — ABNORMAL HIGH (ref 4.0–10.5)

## 2014-09-01 LAB — GLUCOSE, CAPILLARY
GLUCOSE-CAPILLARY: 89 mg/dL (ref 65–99)
Glucose-Capillary: 138 mg/dL — ABNORMAL HIGH (ref 65–99)
Glucose-Capillary: 159 mg/dL — ABNORMAL HIGH (ref 65–99)
Glucose-Capillary: 93 mg/dL (ref 65–99)

## 2014-09-01 LAB — BASIC METABOLIC PANEL
Anion gap: 7 (ref 5–15)
BUN: 17 mg/dL (ref 6–20)
CHLORIDE: 109 mmol/L (ref 101–111)
CO2: 26 mmol/L (ref 22–32)
Calcium: 7.7 mg/dL — ABNORMAL LOW (ref 8.9–10.3)
Creatinine, Ser: 1.6 mg/dL — ABNORMAL HIGH (ref 0.44–1.00)
GFR, EST AFRICAN AMERICAN: 32 mL/min — AB (ref >60)
GFR, EST NON AFRICAN AMERICAN: 28 mL/min — AB (ref >60)
GLUCOSE: 150 mg/dL — AB (ref 65–99)
POTASSIUM: 3 mmol/L — AB (ref 3.5–5.1)
SODIUM: 142 mmol/L (ref 135–145)

## 2014-09-01 MED ORDER — POTASSIUM CHLORIDE 10 MEQ/100ML IV SOLN
10.0000 meq | INTRAVENOUS | Status: AC
  Administered 2014-09-01 (×5): 10 meq via INTRAVENOUS
  Filled 2014-09-01 (×5): qty 100

## 2014-09-01 MED ORDER — MAGNESIUM SULFATE 2 GM/50ML IV SOLN
2.0000 g | Freq: Once | INTRAVENOUS | Status: AC
  Administered 2014-09-01: 2 g via INTRAVENOUS
  Filled 2014-09-01: qty 50

## 2014-09-01 NOTE — Evaluation (Signed)
Physical Therapy Evaluation Patient Details Name: Tina Hunter MRN: [id number] DOB: 1924/12/09 Today's Date: 09/01/2014   History of Present Illness  HPI: Tina Hunter is a very pleasant 81 y.o. 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 Initial evaluation he feels early sepsis in the setting of urinary tract infection.  Pt has been living with her daughter in [place], cared for by daughter or a paid CG.  Pt ambulates with a hemi walker and 1 person assist and needs assist with ADLs,  She is normally verbal and has little to no use of her LUE.  Clinical Impression  Pt is seen for evaluation.  She was lying supine in bed moaning continually and her head fully flexed to the right.  She denied any pain or other discomfort when asked.  Pt is currently unable to speak intelligibly.  She is not able to follow directions to any degree.  Of concern is that she is unable to look to the right and resists turning her head to the right.  This is new for her according to her daughter.  Pt needs total assist to roll in the bed and we used a total lift to transfer her bed to chair.  Because pt had reasonable function prior to this illness, I am recommending SNF at d/c.  Daughter is in agreement.    Follow Up Recommendations SNF    Equipment Recommendations  None recommended by PT    Recommendations for Other Services   OT    Precautions / Restrictions Precautions Precautions: Fall Restrictions Weight Bearing Restrictions: No      Mobility  Bed Mobility Overal bed mobility: +2 for physical assistance;+ 2 for safety/equipment             General bed mobility comments: maxi move lift used to transfer pt OOB...she needs total assist to roll in the bed                    General transfer comment: total assist needed                                         Balance Overall balance assessment:  (unable to assess)                                           Pertinent Vitals/Pain Pain Assessment: No/denies pain    Home Living Family/patient expects to be discharged to:: Skilled nursing facility                      Prior Function Level of Independence: Needs assistance   Gait / Transfers Assistance Needed: ambulated with a hemi walker and 1 person standby assist, assist with all transfers  ADL's / Homemaking Assistance Needed: able to self feed but assist with all other ADLs                Extremity/Trunk Assessment   Upper Extremity Assessment: LUE deficits/detail       LUE Deficits / Details: no functional use of LUE due to old stroke, significant weakness of the  RUE   Lower Extremity Assessment: Generalized weakness;LLE deficits/detail   LLE Deficits / Details: severe weakness of LLE due to old stroke,     Communication   Communication: Expressive difficulties (moans constantly, occasional words expressed with extreme slurring.)  Cognition Arousal/Alertness: Awake/alert Behavior During Therapy: Anxious Overall Cognitive Status: History of cognitive impairments - at baseline                                    Assessment/Plan    PT Assessment Patient needs continued PT services  PT Diagnosis Generalized weakness   PT Problem List Decreased strength;Decreased activity tolerance;Decreased mobility;Cardiopulmonary status limiting activity  PT Treatment Interventions Functional mobility training;Therapeutic exercise   PT Goals (Current goals can be found in the Care Plan section) Acute Rehab PT Goals Patient Stated Goal: none stated PT Goal Formulation: With family Time For Goal Achievement: 09/15/14 Potential to Achieve Goals: Fair    Frequency Min 3X/week    Barriers to discharge   none                   End of Session Equipment Utilized During Treatment: Oxygen Activity Tolerance: Patient tolerated treatment well Patient left: in chair;with call bell/phone within reach;with chair alarm set Nurse Communication: Mobility status;Need for lift equipment         Time: 1312-1406 PT Time Calculation (min) (ACUTE ONLY): 54 min   Charges:   PT Evaluation $Initial PT Evaluation Tier I: 1 Procedure     PT G CodesSable Feil 09/01/2014, 2:17 PM

## 2014-09-01 NOTE — Care Management Note (Signed)
Case Management Note  Patient Details  Name: Tina Hunter MRN: [id number] Date of Birth: 04-07-1925   Expected Discharge Date:                  Expected Discharge Plan:  [place] Referral:  Clinical Social Work  Discharge planning Services  CM Consult  Post Acute Care Choice:  Durable Medical Equipment, Home Health Choice offered to:  Patient  DME Arranged:    DME Agency:  [facility]  HH Arranged:    [facility] Agency:  [facility]  Status of Service:  Completed, signed off  Medicare Important Message Given:    Date Medicare IM Given:    Medicare IM give by:    Date Additional Medicare IM Given:    Additional Medicare Important Message give by:     If discussed at [place] of Stay Meetings, dates discussed:    Additional Comments: PT has worked with pt and recommends SNF at discharge. CSW is aware and will see pt to arrange for placement. No CM needs at this time.   Sherald Barge, RN 09/01/2014, 2:30 PM

## 2014-09-01 NOTE — Progress Notes (Signed)
[facility] PROGRESS NOTE  Tina Hunter RSW:[id number] DOB: 21-Sep-1924 DOA: 08/25/2014 PCP: No primary care provider on file.  Assessment/Plan: Sepsis secondary to acute cholecystitis, UTI and enterococcal bacteremia -Urine growing Escherichia coli and group D strep, one out of 2 blood cultures with VRE, fluid from cholecystostomy growing Klebsiella. -Continue linezolid, Rocephin and Flagyl, antibiotic regimen has been discussed with infectious diseases. -IR placed cholecystostomy tube on 5/20, as patient was deemed too sick to undergo cholecystectomy. It continues to drain. -Afebrile overnight, WBC count stable to improved.  Acute encephalopathy -Secondary to sepsis on top of baseline dementia. -Is starting to improve today, has been able to wake up and answer some simple questions.  Mild acute diastolic CHF -Echo with ejection fraction of 65-70% with grade 1 diastolic dysfunction. -Appears clinically compensated, see no need for scheduled Lasix at this point given her ongoing sepsis.  Acute renal failure -Unsure of baseline creatinine, renal function appears improved this morning with a creatinine of 1.6.  Hypernatremia -Improved on D5 at 50., Need to be mindful of diastolic CHF.  Hypokalemia/hypomagnesemia -Potassium is 3.0 and magnesium 1.5 today -Repleted both IV.  Diabetes mellitus -Well controlled, continue current regimen.  Nutrition and disposition -We'll obtain speech therapy and physical therapy consultations today as patient is more alert.  Code Status: Partial code Family Communication: Discussed plan of care with both daughters at bedside  Disposition Plan: Transfer to floor today   Consultants:  Surgery, Dr. Arnoldo Morale   Antibiotics:             Linezolid  Rocephin  Flagyl  Subjective: More alert today, is able to open eyes and answer very simple questions.  Objective: Filed Vitals:   09/01/14 0700 09/01/14 0800 09/01/14 0821  09/01/14 0900  BP: 151/67 161/61  169/67  Pulse: 78 80  70  Temp:   98 F (36.7 C)   TempSrc:   Axillary   Resp: 15 15  14   Height:      Weight:      SpO2: 100% 100%  100%    Intake/Output Summary (Last 24 hours) at 09/01/14 1000 Last data filed at 09/01/14 0507  Gross per 24 hour  Intake 1988.33 ml  Output    690 ml  Net 1298.33 ml   Filed Weights   08/25/14 0935  Weight: 64.864 kg (143 lb)    Exam:   General:  Drowsy, arousable  Cardiovascular: Regular rate and rhythm  Respiratory: Clear to auscultation bilaterally  Abdomen: Cholecystostomy tube in place, soft, decreased bowel sounds, nondistended  Extremities: Trace bilateral edema   Neurologic:  Unable to fully assess, but moves all 4 spontaneously  Data Reviewed: Basic Metabolic Panel:  Recent Labs Lab 08/28/14 0503 08/29/14 0433 08/30/14 0915 08/31/14 0601 09/01/14 0430  NA 145 147* 147* 146* 142  K 3.7 3.6 3.5 3.1* 3.0*  CL 115* 116* 113* 112* 109  CO2 24 23 22 26 26   GLUCOSE 189* 108* 101* 121* 150*  BUN 17 17 24* 22* 17  CREATININE 1.47* 1.73* 1.97* 1.77* 1.60*  CALCIUM 7.3* 7.7* 8.1* 8.0* 7.7*  MG 1.4*  --   --  1.5*  --    Liver Function Tests:  Recent Labs Lab 08/25/14 1005 08/26/14 1030 08/29/14 0433  AST 36 42* 32  ALT 62* 54 34  ALKPHOS 69 87 207*  BILITOT 0.7 0.4 0.6  PROT 7.2 5.6* 5.6*  ALBUMIN 3.3* 2.4* 2.1*   No results for input(s): LIPASE, AMYLASE in the last  168 hours. No results for input(s): AMMONIA in the last 168 hours. CBC:  Recent Labs Lab 08/25/14 1005 08/25/14 2000  08/27/14 0417 08/28/14 0503 08/29/14 0433 08/30/14 0915 08/31/14 0601 09/01/14 0430  WBC 33.3* 13.0*  < > 22.8* 24.9* 19.4* 14.4* 12.9* 12.2*  NEUTROABS 28.6* 12.7*  --  20.2*  --   --   --   --   --   HGB 12.3 10.6*  < > 8.6* 9.3* 9.1* 9.3* 8.6* 8.2*  HCT 37.8 32.1*  < > 26.6* 28.4* 28.2* 28.3* 26.6* 25.5*  MCV 96.7 95.5  < > 97.1 96.3 96.6 96.9 96.7 97.3  PLT 265 197  < > 180 186  185 202 218 231  < > = values in this interval not displayed. Cardiac Enzymes:  Recent Labs Lab 08/25/14 1005 08/25/14 1813 08/26/14 0425  TROPONINI <0.03 0.03 0.05*   BNP (last 3 results) No results for input(s): BNP in the last 8760 hours.  ProBNP (last 3 results) No results for input(s): PROBNP in the last 8760 hours.  CBG:  Recent Labs Lab 08/31/14 0719 08/31/14 1122 08/31/14 1638 08/31/14 2110 09/01/14 0726  GLUCAP 110* 126* 134* 116* 138*    Recent Results (from the past 240 hour(s))  Culture, Urine     Status: None   Collection Time: 08/25/14 10:18 AM  Result Value Ref Range Status   Specimen Description URINE, CATHETERIZED  Final   Special Requests NONE  Final   Colony Count   Final    >=100,000 COLONIES/ML Performed at [facility]    Culture   Final    [place] D;high probability for S.bovis Performed at [facility]    Report Status 08/28/2014 FINAL  Final   Organism ID, Bacteria ESCHERICHIA COLI  Final   Organism ID, Bacteria STREPTOCOCCUS GROUP D;high probability for S.bovis  Final      Susceptibility   Escherichia coli - MIC*    CEFAZOLIN <=4 SENSITIVE Sensitive     CEFTRIAXONE <=1 SENSITIVE Sensitive     CIPROFLOXACIN >=4 RESISTANT Resistant     GENTAMICIN <=1 SENSITIVE Sensitive     LEVOFLOXACIN >=8 RESISTANT Resistant     NITROFURANTOIN 64 INTERMEDIATE Intermediate     TOBRAMYCIN <=1 SENSITIVE Sensitive     * ESCHERICHIA COLI   Streptococcus group d;high probability for s.bovis - MIC (ETEST)*    PENICILLIN 0.125 SENSITIVE Sensitive     * STREPTOCOCCUS GROUP D;high probability for S.bovis  Blood culture (routine x 2)     Status: None   Collection Time: 08/25/14 11:09 AM  Result Value Ref Range Status   Specimen Description BLOOD RIGHT HAND  Final   Special Requests BOTTLES DRAWN AEROBIC ONLY 4CC  Final   Culture NO GROWTH 5 DAYS  Final   Report Status 08/30/2014 FINAL  Final  MRSA PCR  Screening     Status: None   Collection Time: 08/25/14  1:20 PM  Result Value Ref Range Status   MRSA by PCR NEGATIVE NEGATIVE Final    Comment:        The GeneXpert MRSA Assay (FDA approved for NASAL specimens only), is one component of a comprehensive MRSA colonization surveillance program. It is not intended to diagnose MRSA infection nor to guide or monitor treatment for MRSA infections.   Culture, blood (x 2)     Status: None   Collection Time: 08/25/14  8:00 PM  Result Value Ref Range Status   Specimen Description BLOOD PICC  LINE DRAWN BY RN RL  Final   Special Requests BOTTLES DRAWN AEROBIC AND ANAEROBIC 6CC  Final   Culture   Final    VANCOMYCIN RESISTANT ENTEROCOCCUS ISOLATED Note: CRITICAL RESULT CALLED TO, READ BACK BY AND VERIFIED WITH: MISTY MCDANIEL @ 9:24 AM 08/30/14 BY DWEEKS Performed at [facility]    Report Status 08/30/2014 FINAL  Final   Organism ID, Bacteria VANCOMYCIN RESISTANT ENTEROCOCCUS ISOLATED  Final      Susceptibility   Vancomycin resistant enterococcus isolated - MIC*    AMPICILLIN >=32 RESISTANT Resistant     VANCOMYCIN >256 RESISTANT Resistant     TETRACYCLINE <=1 SENSITIVE Sensitive     LINEZOLID 2 SENSITIVE Sensitive     * VANCOMYCIN RESISTANT ENTEROCOCCUS ISOLATED  Culture, blood (routine x 2)     Status: None (Preliminary result)   Collection Time: 08/28/14 10:00 AM  Result Value Ref Range Status   Specimen Description BLOOD PORTA CATH DRAWN BY RN  Final   Special Requests   Final    BOTTLES DRAWN AEROBIC AND ANAEROBIC AEB=8CC ANA=6CC   Culture NO GROWTH 3 DAYS  Final   Report Status PENDING  Incomplete  Culture, blood (routine x 2)     Status: None (Preliminary result)   Collection Time: 08/28/14 10:18 AM  Result Value Ref Range Status   Specimen Description BLOOD LEFT HAND  Final   Special Requests BOTTLES DRAWN AEROBIC AND ANAEROBIC 6CC  Final   Culture NO GROWTH 3 DAYS  Final   Report Status PENDING  Incomplete  Body  fluid culture     Status: None (Preliminary result)   Collection Time: 08/29/14  2:55 PM  Result Value Ref Range Status   Specimen Description GALL BLADDER CHOLECYSTOSTOMY TUBE  Final   Special Requests NONE  Final   Gram Stain   Final    NO WBC SEEN FEW GRAM POSITIVE COCCI IN PAIRS IN CLUSTERS RARE GRAM NEGATIVE RODS Gram Stain Report Called to,Read Back By and Verified With: Gram Stain Report Called to,Read Back By and Verified With: MISTY MCDANIEL AT 7829 ON [id number] BY WEBSP Performed at [facility]    Culture   Final    MODERATE KLEBSIELLA PNEUMONIAE Performed at [facility]    Report Status PENDING  Incomplete   Organism ID, Bacteria KLEBSIELLA PNEUMONIAE  Final      Susceptibility   Klebsiella pneumoniae - MIC*    AMPICILLIN >=32 RESISTANT Resistant     AMPICILLIN/SULBACTAM 8 SENSITIVE Sensitive     CEFAZOLIN <=4 SENSITIVE Sensitive     CEFEPIME <=1 SENSITIVE Sensitive     CEFTAZIDIME <=1 SENSITIVE Sensitive     CEFTRIAXONE <=1 SENSITIVE Sensitive     CIPROFLOXACIN <=0.25 SENSITIVE Sensitive     GENTAMICIN <=1 SENSITIVE Sensitive     IMIPENEM <=0.25 SENSITIVE Sensitive     PIP/TAZO 8 SENSITIVE Sensitive     TOBRAMYCIN <=1 SENSITIVE Sensitive     TRIMETH/SULFA <=20 SENSITIVE Sensitive     * MODERATE KLEBSIELLA PNEUMONIAE     Studies: No results found.  Scheduled Meds: . antiseptic oral rinse  7 mL Mouth Rinse BID  . cefTRIAXone (ROCEPHIN)  IV  1 g Intravenous Q24H  . heparin  5,000 Units Subcutaneous 3 times per day  . insulin aspart  0-5 Units Subcutaneous QHS  . insulin aspart  0-9 Units Subcutaneous TID WC  . levETIRAcetam  500 mg Intravenous Q12H  . levothyroxine  25 mcg Intravenous Daily  . linezolid  600 mg Intravenous Q12H  . metoprolol  5 mg Intravenous 4 times per day  . metronidazole  500 mg Intravenous Q8H   Continuous Infusions: . dextrose 50 mL/hr at 08/31/14 1700    Principal Problem:   Sepsis Active Problems:   Weakness  generalized   CVA, old, hemiparesis   UTI (lower urinary tract infection)   Seizure disorder   Hypertension   Acute renal failure   Dementia   Cholecystitis, acute   Seizures   Leukocytosis   Acidosis   Sepsis secondary to UTI    Time spent: 40 minutes. Greater than 50% of this time was spent in direct contact with the patient coordinating care.    Lelon Frohlich  [facility] Pager [phone]  If 7PM-7AM, please contact night-coverage at [email or web address], password [facility] 09/01/2014, 10:00 AM  LOS: 7 days

## 2014-09-01 NOTE — Progress Notes (Signed)
Called report to Janan Ridge, RN on dept 300.  Verbalized understanding. Pt transferred to room 307 in safe and stable condition. [place], Eulis Canner 09/01/2014

## 2014-09-02 LAB — BODY FLUID CULTURE: Gram Stain: NONE SEEN

## 2014-09-02 LAB — GLUCOSE, CAPILLARY
GLUCOSE-CAPILLARY: 165 mg/dL — AB (ref 65–99)
GLUCOSE-CAPILLARY: 115 mg/dL — AB (ref 65–99)
Glucose-Capillary: 145 mg/dL — ABNORMAL HIGH (ref 65–99)
Glucose-Capillary: 130 mg/dL — ABNORMAL HIGH (ref 65–99)

## 2014-09-02 LAB — CBC
HCT: 24.2 % — ABNORMAL LOW (ref 36.0–46.0)
Hemoglobin: 8 g/dL — ABNORMAL LOW (ref 12.0–15.0)
MCH: 31.5 pg (ref 26.0–34.0)
MCHC: 33.1 g/dL (ref 30.0–36.0)
MCV: 95.3 fL (ref 78.0–100.0)
PLATELETS: 249 10*3/uL (ref 150–400)
RBC: 2.54 MIL/uL — ABNORMAL LOW (ref 3.87–5.11)
RDW: 13.7 % (ref 11.5–15.5)
WBC: 11.5 10*3/uL — ABNORMAL HIGH (ref 4.0–10.5)

## 2014-09-02 LAB — BASIC METABOLIC PANEL
Anion gap: 7 (ref 5–15)
BUN: 11 mg/dL (ref 6–20)
CALCIUM: 7.5 mg/dL — AB (ref 8.9–10.3)
CO2: 26 mmol/L (ref 22–32)
Chloride: 99 mmol/L — ABNORMAL LOW (ref 101–111)
Creatinine, Ser: 1.33 mg/dL — ABNORMAL HIGH (ref 0.44–1.00)
GFR calc non Af Amer: 35 mL/min — ABNORMAL LOW (ref >60)
GFR, EST AFRICAN AMERICAN: 40 mL/min — AB (ref >60)
GLUCOSE: 361 mg/dL — AB (ref 65–99)
Potassium: 3 mmol/L — ABNORMAL LOW (ref 3.5–5.1)
SODIUM: 132 mmol/L — AB (ref 135–145)

## 2014-09-02 LAB — CULTURE, BLOOD (ROUTINE X 2)
CULTURE: NO GROWTH
CULTURE: NO GROWTH

## 2014-09-02 LAB — MAGNESIUM: Magnesium: 1.4 mg/dL — ABNORMAL LOW (ref 1.7–2.4)

## 2014-09-02 MED ORDER — POTASSIUM CHLORIDE 10 MEQ/100ML IV SOLN
10.0000 meq | INTRAVENOUS | Status: AC
  Administered 2014-09-02 – 2014-09-03 (×6): 10 meq via INTRAVENOUS
  Filled 2014-09-02: qty 100

## 2014-09-02 MED ORDER — MAGNESIUM SULFATE 2 GM/50ML IV SOLN
2.0000 g | Freq: Once | INTRAVENOUS | Status: AC
  Administered 2014-09-02: 2 g via INTRAVENOUS
  Filled 2014-09-02: qty 50

## 2014-09-02 NOTE — Care Management Note (Signed)
Case Management Note  Patient Details  Name: Tina Hunter MRN: [id number] Date of Birth: 1924-10-24  Expected Discharge Date:                  Expected Discharge Plan:  [place] Referral:  Clinical Social Work  Discharge planning Services  CM Consult  Post Acute Care Choice:  Durable Medical Equipment, Home Health Choice offered to:  Patient  DME Arranged:    DME Agency:  [place]  HH Arranged:    [facility] Agency:  [facility]  Status of Service:  Completed, signed off  Medicare Important Message Given:    Date Medicare IM Given:    Medicare IM give by:    Date Additional Medicare IM Given:    Additional Medicare Important Message give by:     If discussed at [place] of Stay Meetings, dates discussed:  09/02/2014  Additional Comments: Pt transferred to tele floor on 09/01/2014. SLP attempted to work with patient today but was unable to arouse pt. SLP will attempt eval again at a later time, CM  Will cont to follow.   Sherald Barge, RN 09/02/2014, 1:12 PM

## 2014-09-02 NOTE — Clinical Social Work Note (Signed)
CSW met with pt's daughter Hoyle Sauer to discuss SNF placement further. Hoyle Sauer requests [facility] if available. CSW will initiate referral.  Benay Pike, [place]

## 2014-09-02 NOTE — Progress Notes (Signed)
[facility] PROGRESS NOTE  Tina Hunter JWJ:[id number] DOB: [birth year] DOA: 08/25/2014 PCP: No primary care provider on file.  Assessment/Plan: Sepsis secondary to acute cholecystitis and UTI - urine cultures with Escherichia coli and group D strep. -Blood cultures and culture from cholecystostomy tube growing VRE. -Continue Zyvox for a planned course of 14 days (discussed with ID, Dr. Linus Salmons)  - will discontinue Rocephin given length of treatment and Flagyl based on culture data. -Has been afebrile overnight, WBC count is slightly improved.  Acute encephalopathy  -secondary to sepsis on top of baseline dementia. -She seems to be slightly improved today, first day she moves her head to look at me straight in the eye and can answer simple questions. She seems to be most drowsy during the mornings. -I do not see any clinical indications for acute CVA at this time, daughter has brought this concern up today, however even if she were to have had an acute CVA, do not see how ordering an MRI will change my management course.  Mild acute diastolic CHF -Echo with ejection fraction of 65-70% and grade 1 diastolic dysfunction. -Is clinically compensated, see no need for schedule Lasix at this point.  Acute renal failure -Unsure of baseline creatinine, renal function continues to improve.  Hypernatremia -Resolved.  Hypokalemia/hypomagnesemia -Potassium 3.0 and magnesium 1.4 today. Will replete both IV.  Diabetes mellitus -Well-controlled.  Nutrition and disposition -Speech therapy consultation is pending, PT has recommended SNF and daughters agree. -If fails swallow evaluation, will need discussion with family re: Feeding tube versus comfort feeds.  Code Status: Partial code Family Communication: Discussed with daughter Chrys Racer at bedside  Disposition Plan: To SNF once medically stable   Consultants:  Surgery, Dr. Arnoldo Morale   Antibiotics:  Zyvox   Subjective: A  little more interactive today  Objective: Filed Vitals:   09/01/14 2122 09/02/14 0044 09/02/14 0649 09/02/14 1300  BP: 155/60 156/86 164/68 164/57  Pulse: 72 78 69 66  Temp: 98.6 F (37 C)  97.8 F (36.6 C) 97.8 F (36.6 C)  TempSrc: Axillary  Axillary Axillary  Resp: 18  18 18   Height:      Weight:      SpO2: 100% 99% 98% 97%    Intake/Output Summary (Last 24 hours) at 09/02/14 1318 Last data filed at 09/02/14 1316  Gross per 24 hour  Intake   1200 ml  Output    212 ml  Net    988 ml   Filed Weights   08/25/14 0935  Weight: 64.864 kg (143 lb)    Exam:   General:  Awake, still drowsy, awakens to voice  Cardiovascular: Regular rate and rhythm  Respiratory: Mild bilateral rhonchi, fair air movement  Abdomen: Soft, nontender, nondistended, hypoactive bowel sounds, cholecystostomy tube present  Extremities: Trace bilateral edema   Neurologic:  Residual left hemiparesis from prior CVA  Data Reviewed: Basic Metabolic Panel:  Recent Labs Lab 08/28/14 0503 08/29/14 0433 08/30/14 0915 08/31/14 0601 09/01/14 0430 09/02/14 0654  NA 145 147* 147* 146* 142 132*  K 3.7 3.6 3.5 3.1* 3.0* 3.0*  CL 115* 116* 113* 112* 109 99*  CO2 24 23 22 26 26 26   GLUCOSE 189* 108* 101* 121* 150* 361*  BUN 17 17 24* 22* 17 11  CREATININE 1.47* 1.73* 1.97* 1.77* 1.60* 1.33*  CALCIUM 7.3* 7.7* 8.1* 8.0* 7.7* 7.5*  MG 1.4*  --   --  1.5*  --  1.4*   Liver Function Tests:  Recent Labs Lab  08/29/14 0433  AST 32  ALT 34  ALKPHOS 207*  BILITOT 0.6  PROT 5.6*  ALBUMIN 2.1*   No results for input(s): LIPASE, AMYLASE in the last 168 hours. No results for input(s): AMMONIA in the last 168 hours. CBC:  Recent Labs Lab 08/27/14 0417  08/29/14 0433 08/30/14 0915 08/31/14 0601 09/01/14 0430 09/02/14 0654  WBC 22.8*  < > 19.4* 14.4* 12.9* 12.2* 11.5*  NEUTROABS 20.2*  --   --   --   --   --   --   HGB 8.6*  < > 9.1* 9.3* 8.6* 8.2* 8.0*  HCT 26.6*  < > 28.2* 28.3* 26.6*  25.5* 24.2*  MCV 97.1  < > 96.6 96.9 96.7 97.3 95.3  PLT 180  < > 185 202 218 231 249  < > = values in this interval not displayed. Cardiac Enzymes: No results for input(s): CKTOTAL, CKMB, CKMBINDEX, TROPONINI in the last 168 hours. BNP (last 3 results) No results for input(s): BNP in the last 8760 hours.  ProBNP (last 3 results) No results for input(s): PROBNP in the last 8760 hours.  CBG:  Recent Labs Lab 09/01/14 1110 09/01/14 1716 09/01/14 2029 09/02/14 0726 09/02/14 1132  GLUCAP 159* 93 89 145* 165*    Recent Results (from the past 240 hour(s))  Culture, Urine     Status: None   Collection Time: 08/25/14 10:18 AM  Result Value Ref Range Status   Specimen Description URINE, CATHETERIZED  Final   Special Requests NONE  Final   Colony Count   Final    >=100,000 COLONIES/ML Performed at [facility]    Culture   Final    [place] D;high probability for S.bovis Performed at [facility]    Report Status 08/28/2014 FINAL  Final   Organism ID, Bacteria ESCHERICHIA COLI  Final   Organism ID, Bacteria STREPTOCOCCUS GROUP D;high probability for S.bovis  Final      Susceptibility   Escherichia coli - MIC*    CEFAZOLIN <=4 SENSITIVE Sensitive     CEFTRIAXONE <=1 SENSITIVE Sensitive     CIPROFLOXACIN >=4 RESISTANT Resistant     GENTAMICIN <=1 SENSITIVE Sensitive     LEVOFLOXACIN >=8 RESISTANT Resistant     NITROFURANTOIN 64 INTERMEDIATE Intermediate     TOBRAMYCIN <=1 SENSITIVE Sensitive     * ESCHERICHIA COLI   Streptococcus group d;high probability for s.bovis - MIC (ETEST)*    PENICILLIN 0.125 SENSITIVE Sensitive     * STREPTOCOCCUS GROUP D;high probability for S.bovis  Blood culture (routine x 2)     Status: None   Collection Time: 08/25/14 11:09 AM  Result Value Ref Range Status   Specimen Description BLOOD RIGHT HAND  Final   Special Requests BOTTLES DRAWN AEROBIC ONLY 4CC  Final   Culture NO GROWTH 5 DAYS  Final    Report Status 08/30/2014 FINAL  Final  MRSA PCR Screening     Status: None   Collection Time: 08/25/14  1:20 PM  Result Value Ref Range Status   MRSA by PCR NEGATIVE NEGATIVE Final    Comment:        The GeneXpert MRSA Assay (FDA approved for NASAL specimens only), is one component of a comprehensive MRSA colonization surveillance program. It is not intended to diagnose MRSA infection nor to guide or monitor treatment for MRSA infections.   Culture, blood (x 2)     Status: None   Collection Time: 08/25/14  8:00 PM  Result Value Ref Range Status   Specimen Description BLOOD PICC LINE DRAWN BY RN RL  Final   Special Requests BOTTLES DRAWN AEROBIC AND ANAEROBIC 6CC  Final   Culture   Final    VANCOMYCIN RESISTANT ENTEROCOCCUS ISOLATED Note: CRITICAL RESULT CALLED TO, READ BACK BY AND VERIFIED WITH: MISTY MCDANIEL @ 9:24 AM 08/30/14 BY DWEEKS Performed at [facility]    Report Status 08/30/2014 FINAL  Final   Organism ID, Bacteria VANCOMYCIN RESISTANT ENTEROCOCCUS ISOLATED  Final      Susceptibility   Vancomycin resistant enterococcus isolated - MIC*    AMPICILLIN >=32 RESISTANT Resistant     VANCOMYCIN >256 RESISTANT Resistant     TETRACYCLINE <=1 SENSITIVE Sensitive     LINEZOLID 2 SENSITIVE Sensitive     * VANCOMYCIN RESISTANT ENTEROCOCCUS ISOLATED  Culture, blood (routine x 2)     Status: None   Collection Time: 08/28/14 10:00 AM  Result Value Ref Range Status   Specimen Description BLOOD PORTA CATH DRAWN BY RN  Final   Special Requests   Final    BOTTLES DRAWN AEROBIC AND ANAEROBIC AEB=8CC ANA=6CC   Culture NO GROWTH 5 DAYS  Final   Report Status 09/02/2014 FINAL  Final  Culture, blood (routine x 2)     Status: None   Collection Time: 08/28/14 10:18 AM  Result Value Ref Range Status   Specimen Description BLOOD LEFT HAND  Final   Special Requests BOTTLES DRAWN AEROBIC AND ANAEROBIC 6CC  Final   Culture NO GROWTH 5 DAYS  Final   Report Status 09/02/2014 FINAL   Final  Body fluid culture     Status: None   Collection Time: 08/29/14  2:55 PM  Result Value Ref Range Status   Specimen Description GALL BLADDER CHOLECYSTOSTOMY TUBE  Final   Special Requests NONE  Final   Gram Stain   Final    NO WBC SEEN FEW GRAM POSITIVE COCCI IN PAIRS IN CLUSTERS RARE GRAM NEGATIVE RODS Gram Stain Report Called to,Read Back By and Verified With: Gram Stain Report Called to,Read Back By and Verified With: MISTY MCDANIEL AT 20 ON [id number] BY WEBSP Performed at [facility]    Culture   Final    MODERATE KLEBSIELLA PNEUMONIAE VANCOMYCIN RESISTANT ENTEROCOCCUS ISOLATED Note: CRITICAL RESULT CALLED TO, READ BACK BY AND VERIFIED WITH: VAL DEESE @ 0909 ON [id number] BY [facility] Performed at [facility]    Report Status 09/02/2014 FINAL  Final   Organism ID, Bacteria KLEBSIELLA PNEUMONIAE  Final   Organism ID, Bacteria VANCOMYCIN RESISTANT ENTEROCOCCUS ISOLATED  Final      Susceptibility   Klebsiella pneumoniae - MIC*    AMPICILLIN >=32 RESISTANT Resistant     AMPICILLIN/SULBACTAM 8 SENSITIVE Sensitive     CEFAZOLIN <=4 SENSITIVE Sensitive     CEFEPIME <=1 SENSITIVE Sensitive     CEFTAZIDIME <=1 SENSITIVE Sensitive     CEFTRIAXONE <=1 SENSITIVE Sensitive     CIPROFLOXACIN <=0.25 SENSITIVE Sensitive     GENTAMICIN <=1 SENSITIVE Sensitive     IMIPENEM <=0.25 SENSITIVE Sensitive     PIP/TAZO 8 SENSITIVE Sensitive     TOBRAMYCIN <=1 SENSITIVE Sensitive     TRIMETH/SULFA <=20 SENSITIVE Sensitive     * MODERATE KLEBSIELLA PNEUMONIAE   Vancomycin resistant enterococcus isolated - MIC*    AMPICILLIN >=32 RESISTANT Resistant     VANCOMYCIN 256 RESISTANT Resistant     LINEZOLID 2 SENSITIVE Sensitive     * VANCOMYCIN RESISTANT ENTEROCOCCUS ISOLATED  Studies: No results found.  Scheduled Meds: . antiseptic oral rinse  7 mL Mouth Rinse BID  . cefTRIAXone (ROCEPHIN)  IV  1 g Intravenous Q24H  . heparin  5,000 Units Subcutaneous 3 times per day  .  insulin aspart  0-5 Units Subcutaneous QHS  . insulin aspart  0-9 Units Subcutaneous TID WC  . levETIRAcetam  500 mg Intravenous Q12H  . levothyroxine  25 mcg Intravenous Daily  . linezolid  600 mg Intravenous Q12H  . magnesium sulfate 1 - 4 g bolus IVPB  2 g Intravenous Once  . metoprolol  5 mg Intravenous 4 times per day  . metronidazole  500 mg Intravenous Q8H  . potassium chloride  10 mEq Intravenous Q1 Hr x 6   Continuous Infusions: . dextrose 50 mL/hr at 09/01/14 2149    Principal Problem:   Sepsis Active Problems:   Weakness generalized   CVA, old, hemiparesis   UTI (lower urinary tract infection)   Seizure disorder   Hypertension   Acute renal failure   Dementia   Cholecystitis, acute   Seizures   Leukocytosis   Acidosis   Sepsis secondary to UTI    Time spent: 40 minutes. Greater than 50% of this time was spent in direct contact with the patient coordinating care.    Lelon Frohlich  [facility] Pager [phone]  If 7PM-7AM, please contact night-coverage at [email or web address], password [facility] 09/02/2014, 1:18 PM  LOS: 8 days

## 2014-09-02 NOTE — Clinical Social Work Placement (Signed)
   CLINICAL SOCIAL WORK PLACEMENT  NOTE  Date:  09/02/2014  Patient Details  Name: Tina Hunter MRN: [id number] Date of Birth: [birth year]  Clinical Social Work is seeking post-discharge placement for this patient at the [place] level of care (*CSW will initial, date and re-position this form in  chart as items are completed):  Yes   Patient/family provided with [facility] Work Department's list of facilities offering this level of care within the geographic area requested by the patient (or if unable, by the patient's family).  Yes   Patient/family informed of their freedom to choose among providers that offer the needed level of care, that participate in Medicare, Medicaid or managed care program needed by the patient, have an available bed and are willing to accept the patient.  Yes   Patient/family informed of [facility]'s ownership interest in [facility] and [facility], as well as of the fact that they are under no obligation to receive care at these facilities.  PASRR submitted to EDS on       PASRR number received on       Existing PASRR number confirmed on       FL2 transmitted to all facilities in geographic area requested by pt/family on 09/02/14     FL2 transmitted to all facilities within larger geographic area on       Patient informed that his/her managed care company has contracts with or will negotiate with certain facilities, including the following:            Patient/family informed of bed offers received.  Patient chooses bed at       Physician recommends and patient chooses bed at      Patient to be transferred to   on  .  Patient to be transferred to facility by       Patient family notified on   of transfer.  Name of family member notified:        PHYSICIAN       Additional Comment:   No pasarr needed per [place] facilities.  _______________________________________________ Salome Arnt,  LCSW 09/02/2014, 1:15 PM [phone]

## 2014-09-02 NOTE — Evaluation (Signed)
Clinical/Bedside Swallow Evaluation Patient Details  Name: Tina Hunter MRN: [id number] Date of Birth: 1925/03/25  Today's Date: 09/02/2014 Time: SLP Start Time (ACUTE ONLY): 1035 SLP Stop Time (ACUTE ONLY): 1055 SLP Time Calculation (min) (ACUTE ONLY): 20 min  Past Medical History:  Past Medical History  Diagnosis Date  . Stroke   . Diabetes mellitus without complication   . Hypertension   . High cholesterol   . Brain tumor (benign)   . Chronic bronchitis   . Seizures    Past Surgical History:  Past Surgical History  Procedure Laterality Date  . Brain surgery    . Abdominal hysterectomy     HPI:  Tina Hunter is a 81 y.o. female who presented [facility] with AMS found to have UTI, ARF, sepsis and acute cholecystitis s/p HIDA. The patient is lethargic and confused and history is obtained per chart review and from the patient's daughter. IR received request for percutaneous Cholecystostomy since patient is too high risk for surgery (completed 08/29/2014). Pt now afebrile with WBC count decreasing. Pt was more alert yesterday (5/22) and swallow evaluation was ordered.    Assessment / Plan / Recommendation Clinical Impression  Tina Hunter was very lethargic today despite light to room, repositioning in bed, sternal rub, damp wash cloth to face, and thermal stim/oral care. Her daughter seemed a bit alarmed as to her mother's sleepiness. RN stated that the pt wasn't given any sedating medications. Daughter also feels "face is twisted to her right", however this is difficult to accurately assess due to pt lethargy. Pt does lean to the right and tongue pockets on right side of oral cavity. Pt opened eyes intermittently and briefly throughout the evaluation and moaned a couple of times. Otherwise she made no attempt to follow commands or communicate with SLP or daughter. She did respond to oral care with 2 swallows, but no response to ice chips to lips or placed on tongue (removed by SLP). Pt  currently too lethargic for safe po intake. SLP will attempt to see Tina Hunter later this afternoon to see if she is more responsive. Daughter and RN (Val) in agreement with plan.    Aspiration Risk  Moderate    Diet Recommendation NPO        Other  Recommendations Oral Care Recommendations: Oral care QID   Follow Up Recommendations       Frequency and Duration min 2x/week  1 week   Pertinent Vitals/Pain VSS    SLP Swallow Goals  Re-assess for po readiness   Swallow Study Prior Functional Status   Lives with a daughter, consumes regular textures/thin liquids    General Date of Onset: 08/25/14 Other Pertinent Information: Tina Hunter is a 81 y.o. female who presented [facility] with AMS found to have UTI, ARF, sepsis and acute cholecystitis s/p HIDA. The patient is lethargic and confused and history is obtained per chart review and from the patient's daughter. IR received request for percutaneous Cholecystostomy since patient is too high risk for surgery (completed 08/29/2014). Pt now afebrile with WBC count decreasing. Pt was more alert yesterday (5/22) and swallow evaluation was ordered.  Type of Study: Bedside swallow evaluation Diet Prior to this Study: NPO Temperature Spikes Noted: No Respiratory Status: Supplemental O2 delivered via (comment) History of Recent Intubation: No Behavior/Cognition: Lethargic/Drowsy Oral Cavity - Dentition: Adequate natural dentition/normal for age;Missing dentition Self-Feeding Abilities: Total assist Patient Positioning: Upright in bed Baseline Vocal Quality: Not observed Volitional Cough: Cognitively unable to elicit Volitional Swallow: Unable to  elicit    Oral/Motor/Sensory Function Overall Oral Motor/Sensory Function: Other (comment) (Difficult to truly assess due to lethargy) Labial ROM: Within Functional Limits Labial Symmetry:  (daughter feels face is "twisted") Labial Strength: Reduced Lingual Symmetry: Abnormal symmetry right  (needs further assessment due to lethargy) Velum:  (could not assess) Mandible: Within Functional Limits   Ice Chips Ice chips: Impaired Presentation:  (gloved finger) Oral Phase Impairments: Reduced labial seal;Reduced lingual movement/coordination;Impaired anterior to posterior transit;Poor awareness of bolus Oral Phase Functional Implications: Right anterior spillage;Oral holding Other Comments:  (pt swallowed 2x during oral care)   Thin Liquid Thin Liquid: Not tested    Nectar Thick Nectar Thick Liquid: Not tested   Honey Thick Honey Thick Liquid: Not tested   Puree Puree: Not tested   Solid   Thank you,  Genene Churn, CCC-SLP [phone]     Solid: Not tested       [name] 09/02/2014,11:58 AM

## 2014-09-03 ENCOUNTER — Encounter (HOSPITAL_COMMUNITY): Payer: Self-pay | Admitting: Internal Medicine

## 2014-09-03 DIAGNOSIS — E118 Type 2 diabetes mellitus with unspecified complications: Secondary | ICD-10-CM | POA: Diagnosis present

## 2014-09-03 DIAGNOSIS — R1312 Dysphagia, oropharyngeal phase: Secondary | ICD-10-CM | POA: Diagnosis present

## 2014-09-03 DIAGNOSIS — N179 Acute kidney failure, unspecified: Secondary | ICD-10-CM

## 2014-09-03 DIAGNOSIS — N390 Urinary tract infection, site not specified: Secondary | ICD-10-CM

## 2014-09-03 DIAGNOSIS — K810 Acute cholecystitis: Secondary | ICD-10-CM

## 2014-09-03 DIAGNOSIS — A419 Sepsis, unspecified organism: Principal | ICD-10-CM

## 2014-09-03 DIAGNOSIS — A491 Streptococcal infection, unspecified site: Secondary | ICD-10-CM

## 2014-09-03 DIAGNOSIS — R7881 Bacteremia: Secondary | ICD-10-CM | POA: Diagnosis present

## 2014-09-03 DIAGNOSIS — I5031 Acute diastolic (congestive) heart failure: Secondary | ICD-10-CM | POA: Diagnosis not present

## 2014-09-03 DIAGNOSIS — B952 Enterococcus as the cause of diseases classified elsewhere: Secondary | ICD-10-CM | POA: Diagnosis present

## 2014-09-03 DIAGNOSIS — Z1621 Resistance to vancomycin: Secondary | ICD-10-CM

## 2014-09-03 LAB — BASIC METABOLIC PANEL
BUN: 7 mg/dL (ref 6–20)
CALCIUM: 8 mg/dL — AB (ref 8.9–10.3)
CO2: 28 mmol/L (ref 22–32)
CREATININE: 1.3 mg/dL — AB (ref 0.44–1.00)
Chloride: 105 mmol/L (ref 101–111)
GFR, EST AFRICAN AMERICAN: 42 mL/min — AB (ref >60)
GFR, EST NON AFRICAN AMERICAN: 36 mL/min — AB (ref >60)
GLUCOSE: 165 mg/dL — AB (ref 65–99)
POTASSIUM: 3.7 mmol/L (ref 3.5–5.1)
Sodium: 135 mmol/L (ref 135–145)

## 2014-09-03 LAB — GLUCOSE, CAPILLARY
GLUCOSE-CAPILLARY: 159 mg/dL — AB (ref 65–99)
Glucose-Capillary: 190 mg/dL — ABNORMAL HIGH (ref 65–99)
Glucose-Capillary: 142 mg/dL — ABNORMAL HIGH (ref 65–99)
Glucose-Capillary: 144 mg/dL — ABNORMAL HIGH (ref 65–99)

## 2014-09-03 LAB — CBC
HCT: 25.7 % — ABNORMAL LOW (ref 36.0–46.0)
Hemoglobin: 8.6 g/dL — ABNORMAL LOW (ref 12.0–15.0)
MCH: 31.9 pg (ref 26.0–34.0)
MCHC: 33.5 g/dL (ref 30.0–36.0)
MCV: 95.2 fL (ref 78.0–100.0)
Platelets: 284 10*3/uL (ref 150–400)
RBC: 2.7 MIL/uL — ABNORMAL LOW (ref 3.87–5.11)
RDW: 13.8 % (ref 11.5–15.5)
WBC: 12 10*3/uL — ABNORMAL HIGH (ref 4.0–10.5)

## 2014-09-03 MED ORDER — POLYVINYL ALCOHOL 1.4 % OP SOLN
1.0000 [drp] | Freq: Three times a day (TID) | OPHTHALMIC | Status: DC
  Administered 2014-09-03 – 2014-09-09 (×18): 1 [drp] via OPHTHALMIC
  Filled 2014-09-03 (×4): qty 15

## 2014-09-03 MED ORDER — KCL IN DEXTROSE-NACL 20-5-0.9 MEQ/L-%-% IV SOLN
INTRAVENOUS | Status: DC
  Administered 2014-09-03 – 2014-09-05 (×3): via INTRAVENOUS
  Administered 2014-09-06: 50 mL/h via INTRAVENOUS
  Administered 2014-09-06 – 2014-09-07 (×2): via INTRAVENOUS

## 2014-09-03 NOTE — Progress Notes (Signed)
Speech Language Pathology Treatment:   Dysphagia Patient Details Name: Tina Hunter MRN: [id number] DOB: [birth year] Today's Date: 09/02/2014 Time:  - 4:45 PM    Assessment / Plan / Recommendation Clinical Impression  Late entry for Tuesday 09/02/2014 PM session. Pt much more alert and responsive, however very poor tolerance for po trials (absent movement of tongue/bolus manipulation) which required suctioning. Pt did swallow in response to oral care again. She is attempting to follow commands. Continue NPO with SLP to reassess tomorrow. Allow ice chips if pt alert and if she desires; after oral care. Above to RN and Dr. Jerilee Hunter.            SLP Plan    NPO reassess Wednesday   Recommendations  NPO  Thank you,  Tina Hunter, CCC-SLP [phone]       (late entry for 09/02/2014 4:30 PM) Tina Hunter 09/03/2014, 9:20 AM

## 2014-09-03 NOTE — Progress Notes (Signed)
Physical Therapy Treatment Patient Details Name: Tina Hunter MRN: [id number] DOB: 01-05-1925 Today's Date: 09/03/2014    History of Present Illness      PT Comments    Pt supine with cervical position in Rt rotation and sidebent, unable to independently rotated head to Lt without assistance. Session focus on A/AROM strengthening exercises, pt able to complete AROM with Rt and AAROM with Lt LE.  Max assistance required with bed mobility.  Daughter reported decreased baseline from entrance to hospital.  No reports of pain through session.  Aids requested to transfer to chair with lift.    Follow Up Recommendations        Equipment Recommendations       Recommendations for Other Services       Precautions / Restrictions      Mobility  Bed Mobility                  Transfers                    Ambulation/Gait                 Stairs            Wheelchair Mobility    Modified Rankin (Stroke Patients Only)       Balance                                    Cognition                            Exercises      General Comments        Pertinent Vitals/Pain      Home Living                      Prior Function            PT Goals (current goals can now be found in the care plan section)      Frequency       PT Plan      Co-evaluation             End of Session           Time:  -     Charges:    TA 71min                   G Codes:     Aldona Lento, PTA  Aldona Lento 09/03/2014, 6:17 PM

## 2014-09-03 NOTE — Progress Notes (Signed)
Report given to oncoming nurse. No pt concerns were noted during report at bedside. Pt's were stable during change of shift assessment. Day shift nurse verbalized understanding.

## 2014-09-03 NOTE — Progress Notes (Signed)
Dressing changed upon nurse handoff. Cholecysotostomy bag emptied. Dressing clean, dry, and intact. Pt repositioned with pillows to right side. Pt  Expressed that she was in pain and pointed to her head. Medication given. Pt resting comfortably.

## 2014-09-03 NOTE — Progress Notes (Signed)
Speech Language Pathology Treatment: Dysphagia  Patient Details Name: Tina Hunter MRN: [id number] DOB: [birth year] Today's Date: 09/03/2014 Time: 7579-7282 SLP Time Calculation (min) (ACUTE ONLY): 26 min  Assessment / Plan / Recommendation Clinical Impression  [name] was again seen at bedside with one of her daughters present. She was more alert and even talkative today. Pt agreeable to po trials. She attempted to follow directions, but had difficulty. She was unable to protrude her tongue from oral cavity, but she was at least able to lateralize it some today. She demonstrated increased po readiness skills today (verbally acknowledging/requesting, opening mouth, closing lips around cup/straw/spoon), but required max verbal/tactile cues to manipulate bolus with her tongue. When given max cues, she was able to swallow 2 small bites ice cream and applesauce, but still had mild/mod residuals on tongue. Although she has shown improvement from yesterday, she continues to be unsafe for po intake at this time. Aspiration precautions were reviewed with pt's daughter and she was given products to facilitate oral care. OK for pt to have 1-2 ice chips periodically for comfort if alert and upright. Recommendations reviewed with Di Kindle, RN.    HPI Other Pertinent Information: Tina Hunter is a 81 y.o. female who presented [facility] with AMS found to have UTI, ARF, sepsis and acute cholecystitis s/p HIDA. The patient is lethargic and confused and history is obtained per chart review and from the patient's daughter. IR received request for percutaneous Cholecystostomy since patient is too high risk for surgery (completed 08/29/2014). Pt now afebrile with WBC count decreasing. Pt was more alert yesterday (5/22) and swallow evaluation was ordered.    Pertinent Vitals Pain Assessment: No/denies pain  SLP Plan  Continue with current plan of care    Recommendations Diet recommendations: NPO Medication Administration:  Via alternative means              Oral Care Recommendations: Oral care QID Follow up Recommendations: 24 hour supervision/assistance Plan: Continue with current plan of care    Thank you,  Genene Churn, [place]      [place] 09/03/2014, 2:30 PM

## 2014-09-03 NOTE — Progress Notes (Signed)
[facility] PROGRESS NOTE  Tina Hunter JYN:[id number] DOB: [birth year] DOA: 08/25/2014 PCP: No primary care provider on file.    Code Status:  Partial code  Family Communication: Discussed with patient's daughter Disposition Plan: To be determined   Consultants:  General surgeon, Dr. Arnoldo Morale  Procedures:  Percutaneous cholecystostomy tube insertion by IR on 08/29/14.  2-D echocardiogram 08/30/14:Study Conclusions - Left ventricle: The cavity size was normal. There was mild concentric hypertrophy. Systolic function was vigorous. The estimated ejection fraction was in the range of 65% to 70%. Wall motion was normal; there were no regional wall motion abnormalities. Doppler parameters are consistent with abnormal left ventricular relaxation (grade 1 diastolic dysfunction). Doppler parameters are consistent with high ventricular filling pressure. - Aortic valve: Mildly to moderately calcified annulus. Trileaflet. - Mitral valve: Mildly calcified annulus. Mildly calcified leaflets . There was mild regurgitation. - Left atrium: The atrium was moderately dilated. - Tricuspid valve: There was mild regurgitation.  Transthoracic echocardiography. M-mode, complete 2D, spectral Doppler, and color Doppler. Birthdate: Patient birthdate: 04/15/1924. Age: Patient is 81 yr old. Sex: Gender: female. BMI: 24.5 kg/m^2. Blood pressure:   110/78 Patient status: Inpatient. Study date: Study date: 08/30/2014. Study time: 08:21 AM. Location: Bedside.  Antibiotics:  Zyvox  Vancomycin, Rocephin, Flagyl, discontinued.  HPI/Subjective: The patient is laying in bed and tries to speak. She is awake. Her daughter is at her bedside and states that she is more alert and interactive than she has been the previous days.  Objective: Filed Vitals:   09/03/14 1428  BP: 156/62  Pulse: 72  Temp: 98.5 F (36.9 C)  Resp: 20   oxygen saturation 95% on room  air.  Intake/Output Summary (Last 24 hours) at 09/03/14 1730 Last data filed at 09/03/14 1200  Gross per 24 hour  Intake   1650 ml  Output    100 ml  Net   1550 ml   Filed Weights   08/25/14 0935  Weight: 64.864 kg (143 lb)    Exam:   General:  Obese, alert, debilitated appearing 81 year old in no acute distress.  Cardiovascular: S1, S2, with a soft systolic murmur.  Respiratory: Coarse breath sounds; breathing nonlabored.  Abdomen: Cholecystostomy tube noted at the right upper quadrant draining serous bilious fluid.  Positive bowel sounds, obese, nontender, nondistended.  Musculoskeletal/extremities: Mild flexion contracture of left upper extremity; no acute hot red joints; no pedal edema.   Neurologic: She is alert and tries to speak and answer, but not clearly understandable; mild dysarthria; no movement of left upper and left lower extremity, but mild movement of right upper extremity and right lower extremity with provocation. She has a chronic left facial droop.  Data Reviewed: Basic Metabolic Panel:  Recent Labs Lab 08/28/14 0503  08/30/14 0915 08/31/14 0601 09/01/14 0430 09/02/14 0654 09/03/14 0613  NA 145  < > 147* 146* 142 132* 135  K 3.7  < > 3.5 3.1* 3.0* 3.0* 3.7  CL 115*  < > 113* 112* 109 99* 105  CO2 24  < > 22 26 26 26 28   GLUCOSE 189*  < > 101* 121* 150* 361* 165*  BUN 17  < > 24* 22* 17 11 7   CREATININE 1.47*  < > 1.97* 1.77* 1.60* 1.33* 1.30*  CALCIUM 7.3*  < > 8.1* 8.0* 7.7* 7.5* 8.0*  MG 1.4*  --   --  1.5*  --  1.4*  --   < > = values in this interval not displayed. Liver Function Tests:  Recent Labs Lab 08/29/14 0433  AST 32  ALT 34  ALKPHOS 207*  BILITOT 0.6  PROT 5.6*  ALBUMIN 2.1*   No results for input(s): LIPASE, AMYLASE in the last 168 hours. No results for input(s): AMMONIA in the last 168 hours. CBC:  Recent Labs Lab 08/30/14 0915 08/31/14 0601 09/01/14 0430 09/02/14 0654 09/03/14 0613  WBC 14.4* 12.9* 12.2*  11.5* 12.0*  HGB 9.3* 8.6* 8.2* 8.0* 8.6*  HCT 28.3* 26.6* 25.5* 24.2* 25.7*  MCV 96.9 96.7 97.3 95.3 95.2  PLT 202 218 231 249 284   Cardiac Enzymes: No results for input(s): CKTOTAL, CKMB, CKMBINDEX, TROPONINI in the last 168 hours. BNP (last 3 results) No results for input(s): BNP in the last 8760 hours.  ProBNP (last 3 results) No results for input(s): PROBNP in the last 8760 hours.  CBG:  Recent Labs Lab 09/02/14 1633 09/02/14 2031 09/03/14 0734 09/03/14 1136 09/03/14 1707  GLUCAP 115* 130* 159* 190* 142*    Recent Results (from the past 240 hour(s))  Culture, Urine     Status: None   Collection Time: 08/25/14 10:18 AM  Result Value Ref Range Status   Specimen Description URINE, CATHETERIZED  Final   Special Requests NONE  Final   Colony Count   Final    >=100,000 COLONIES/ML Performed at [facility]    Culture   Final    Paguate D;high probability for S.bovis Performed at [facility]    Report Status 08/28/2014 FINAL  Final   Organism ID, Bacteria ESCHERICHIA COLI  Final   Organism ID, Bacteria STREPTOCOCCUS GROUP D;high probability for S.bovis  Final      Susceptibility   Escherichia coli - MIC*    CEFAZOLIN <=4 SENSITIVE Sensitive     CEFTRIAXONE <=1 SENSITIVE Sensitive     CIPROFLOXACIN >=4 RESISTANT Resistant     GENTAMICIN <=1 SENSITIVE Sensitive     LEVOFLOXACIN >=8 RESISTANT Resistant     NITROFURANTOIN 64 INTERMEDIATE Intermediate     TOBRAMYCIN <=1 SENSITIVE Sensitive     * ESCHERICHIA COLI   Streptococcus group d;high probability for s.bovis - MIC (ETEST)*    PENICILLIN 0.125 SENSITIVE Sensitive     * STREPTOCOCCUS GROUP D;high probability for S.bovis  Blood culture (routine x 2)     Status: None   Collection Time: 08/25/14 11:09 AM  Result Value Ref Range Status   Specimen Description BLOOD RIGHT HAND  Final   Special Requests BOTTLES DRAWN AEROBIC ONLY 4CC  Final   Culture NO GROWTH 5 DAYS   Final   Report Status 08/30/2014 FINAL  Final  MRSA PCR Screening     Status: None   Collection Time: 08/25/14  1:20 PM  Result Value Ref Range Status   MRSA by PCR NEGATIVE NEGATIVE Final    Comment:        The GeneXpert MRSA Assay (FDA approved for NASAL specimens only), is one component of a comprehensive MRSA colonization surveillance program. It is not intended to diagnose MRSA infection nor to guide or monitor treatment for MRSA infections.   Culture, blood (x 2)     Status: None   Collection Time: 08/25/14  8:00 PM  Result Value Ref Range Status   Specimen Description BLOOD PICC LINE DRAWN BY RN RL  Final   Special Requests BOTTLES DRAWN AEROBIC AND ANAEROBIC 6CC  Final   Culture   Final    VANCOMYCIN RESISTANT ENTEROCOCCUS ISOLATED Note: CRITICAL RESULT CALLED TO, READ  BACK BY AND VERIFIED WITH: MISTY MCDANIEL @ 9:24 AM 08/30/14 BY DWEEKS Performed at [facility]    Report Status 08/30/2014 FINAL  Final   Organism ID, Bacteria VANCOMYCIN RESISTANT ENTEROCOCCUS ISOLATED  Final      Susceptibility   Vancomycin resistant enterococcus isolated - MIC*    AMPICILLIN >=32 RESISTANT Resistant     VANCOMYCIN >256 RESISTANT Resistant     TETRACYCLINE <=1 SENSITIVE Sensitive     LINEZOLID 2 SENSITIVE Sensitive     * VANCOMYCIN RESISTANT ENTEROCOCCUS ISOLATED  Culture, blood (routine x 2)     Status: None   Collection Time: 08/28/14 10:00 AM  Result Value Ref Range Status   Specimen Description BLOOD PORTA CATH DRAWN BY RN  Final   Special Requests   Final    BOTTLES DRAWN AEROBIC AND ANAEROBIC AEB=8CC ANA=6CC   Culture NO GROWTH 5 DAYS  Final   Report Status 09/02/2014 FINAL  Final  Culture, blood (routine x 2)     Status: None   Collection Time: 08/28/14 10:18 AM  Result Value Ref Range Status   Specimen Description BLOOD LEFT HAND  Final   Special Requests BOTTLES DRAWN AEROBIC AND ANAEROBIC 6CC  Final   Culture NO GROWTH 5 DAYS  Final   Report Status  09/02/2014 FINAL  Final  Body fluid culture     Status: None   Collection Time: 08/29/14  2:55 PM  Result Value Ref Range Status   Specimen Description GALL BLADDER CHOLECYSTOSTOMY TUBE  Final   Special Requests NONE  Final   Gram Stain   Final    NO WBC SEEN FEW GRAM POSITIVE COCCI IN PAIRS IN CLUSTERS RARE GRAM NEGATIVE RODS Gram Stain Report Called to,Read Back By and Verified With: Gram Stain Report Called to,Read Back By and Verified With: MISTY MCDANIEL AT 23 ON [id number] BY WEBSP Performed at [facility]    Culture   Final    MODERATE KLEBSIELLA PNEUMONIAE VANCOMYCIN RESISTANT ENTEROCOCCUS ISOLATED Note: CRITICAL RESULT CALLED TO, READ BACK BY AND VERIFIED WITH: VAL DEESE @ 0909 ON [id number] BY [facility] Performed at [facility]    Report Status 09/02/2014 FINAL  Final   Organism ID, Bacteria KLEBSIELLA PNEUMONIAE  Final   Organism ID, Bacteria VANCOMYCIN RESISTANT ENTEROCOCCUS ISOLATED  Final      Susceptibility   Klebsiella pneumoniae - MIC*    AMPICILLIN >=32 RESISTANT Resistant     AMPICILLIN/SULBACTAM 8 SENSITIVE Sensitive     CEFAZOLIN <=4 SENSITIVE Sensitive     CEFEPIME <=1 SENSITIVE Sensitive     CEFTAZIDIME <=1 SENSITIVE Sensitive     CEFTRIAXONE <=1 SENSITIVE Sensitive     CIPROFLOXACIN <=0.25 SENSITIVE Sensitive     GENTAMICIN <=1 SENSITIVE Sensitive     IMIPENEM <=0.25 SENSITIVE Sensitive     PIP/TAZO 8 SENSITIVE Sensitive     TOBRAMYCIN <=1 SENSITIVE Sensitive     TRIMETH/SULFA <=20 SENSITIVE Sensitive     * MODERATE KLEBSIELLA PNEUMONIAE   Vancomycin resistant enterococcus isolated - MIC*    AMPICILLIN >=32 RESISTANT Resistant     VANCOMYCIN 256 RESISTANT Resistant     LINEZOLID 2 SENSITIVE Sensitive     * VANCOMYCIN RESISTANT ENTEROCOCCUS ISOLATED     Studies: No results found.  Scheduled Meds: . antiseptic oral rinse  7 mL Mouth Rinse BID  . heparin  5,000 Units Subcutaneous 3 times per day  . insulin aspart  0-5 Units Subcutaneous  QHS  . insulin aspart  0-9 Units  Subcutaneous TID WC  . levETIRAcetam  500 mg Intravenous Q12H  . levothyroxine  25 mcg Intravenous Daily  . linezolid  600 mg Intravenous Q12H  . metoprolol  5 mg Intravenous 4 times per day  . polyvinyl alcohol  1 drop Both Eyes TID   Continuous Infusions: . dextrose 50 mL/hr at 09/02/14 2350   Assessment and plan:  Principal Problem:   Sepsis secondary to UTI Active Problems:   Cholecystitis, acute   Acidosis   Bacteremia due to vancomycin resistant Enterococcus   CVA, old, hemiparesis   Dysphagia, oropharyngeal   Weakness generalized   Seizure disorder   Acute renal failure   Essential hypertension   Dementia   Leukocytosis   1. Escherichia coli urinary tract infection. The patient was started on Rocephin empirically for a grossly abnormal/infected urinalysis. The urine culture eventually grew out Escherichia coli which was sensitive to Rocephin. She received several days of treatment with Rocephin before it was discontinued. She is now on Zyvox for treatment of VRE bacteremia.  Acute cholecystitis with associated VRE bacteremia. On admission, CT scan of the patient's abdomen and pelvis revealed findings consistent with acute cholecystitis and gallstones. Subsequently, ultrasound of the abdomen was ordered and revealed findings suspicious for acute cholecystitis with gallbladder wall thickening and debris/stones within the gallbladder. HIDA scan revealed nonvisualization of the gallbladder, consistent with acute cholecystitis. Vancomycin and Flagyl were added empirically, but were eventually discontinued when the blood culture became positive for VRE. Following a discussion with ID, Dr. Linus Salmons by Dr. Jerilee Hoh, Zyvox was started. The planned course is for 14 days. Gen. surgeon, Dr. Arnoldo Morale was consulted. All were in agreement that the patient was high risk for surgical intervention. Therefore, a cholecystostomy tube was inserted by IR for  conservative treatment on 5/20.  Sepsis and lactic acidosis secondary to urinary tract infection, acute cholecystitis/VRE bacteremia. We'll continue treatment as stated above. Clinically, sepsis has resolved. Her blood pressure has improved and she is mildly hypertensive. Her white blood cell count was 33.3 on admission and has improved progressively.  Mild acute diastolic heart failure. Patient's follow-up chest x-ray revealed pulmonary edema. This was felt to be in part secondary to vigorous IV fluid hydration for sepsis, following admission. 2-D echocardiogram revealed grade 1 diastolic dysfunction and EF of 65-70%. She was treated with IV Lasix with some diuresis before it was discontinued.  Acute renal failure; nonoliguric. Her creatinine was 2.55 on admission. Her creatinine 5-6 months ago was 1.70, which indicates she likely has chronic kidney disease. Her creatinine has improved overall.  Seizure disorder. Because the patient has severe dysphagia and is nothing by mouth, Tegretol was discontinued in favor of IV Keppra. Currently stable.  Essential hypertension. Her oral medication is on hold secondary to dysphasia. Will continue IV Lopressor.  Type 2 diabetes mellitus. Her CBGs are currently reasonable and mostly less than 200. We'll continue sliding scale NovoLog.  Hypomagnesemia and hypokalemia. Both are being repleted as needed. We'll add potassium chloride to maintenance fluids.  Dysphagia. Per speech therapy, the patient's mental status is such that she does not recommend a diet yet. The patient is a little more alert and I chipped or being tried. We'll continue dextrose and IV fluids. I discussed with the patient's daughter about the possibility that she may need a PEG tube if that's what the patient or family would want just in case she is unable to regain enough swallowing function to eat. She stated that she would have to discuss it  with her sister and other family  members.     Time spent: 35 minutes    [facility] Pager [phone]. If 7PM-7AM, please contact night-coverage at [email or web address], password [facility] 09/03/2014, 5:30 PM  LOS: 9 days

## 2014-09-04 ENCOUNTER — Inpatient Hospital Stay (HOSPITAL_COMMUNITY): Payer: Medicare Other

## 2014-09-04 DIAGNOSIS — I5031 Acute diastolic (congestive) heart failure: Secondary | ICD-10-CM

## 2014-09-04 DIAGNOSIS — G40909 Epilepsy, unspecified, not intractable, without status epilepticus: Secondary | ICD-10-CM

## 2014-09-04 LAB — GLUCOSE, CAPILLARY
GLUCOSE-CAPILLARY: 172 mg/dL — AB (ref 65–99)
Glucose-Capillary: 104 mg/dL — ABNORMAL HIGH (ref 65–99)
Glucose-Capillary: 159 mg/dL — ABNORMAL HIGH (ref 65–99)

## 2014-09-04 MED ORDER — SODIUM CHLORIDE 0.9 % IV SOLN
250.0000 mg | Freq: Two times a day (BID) | INTRAVENOUS | Status: DC
  Administered 2014-09-05 – 2014-09-07 (×4): 250 mg via INTRAVENOUS
  Filled 2014-09-04 (×6): qty 2.5

## 2014-09-04 NOTE — Progress Notes (Signed)
Initial Nutrition Assessment   INTERVENTION:  Magic cup  NUTRITION DIAGNOSIS:  Inadequate oral intake related to inability to eat as evidenced by NPO status.  GOAL:  Patient will meet greater than or equal to 90% of their needs  MONITOR:  PO intake, Supplement acceptance, Weight trends  REASON FOR ASSESSMENT:   poor po's   ASSESSMENT: Pt an 81 yo female who is s/p percutaneous cholecystostomy tube placed 5/20. She is sleeping during my visit but daughter's are present. She was seen by ST today and diet advanced to Puree Nectar thick liquids.She also receive Magic Cup (290 kcal , 9 gr protein) with meals. She is able to feed herself and daughter say pt appetite was very good and weight stable prior to acute episode.. No overt physical signs of malnutrition. Recommend obtain current weight for evaluation.Marland Kitchen  Height:  Ht Readings from Last 1 Encounters:  08/25/14 5\' 4"  (1.626 m)    Weight:  Wt Readings from Last 1 Encounters:  08/25/14 143 lb (64.864 kg)    Ideal Body Weight:  55 kg  Wt Readings from Last 10 Encounters:  08/25/14 143 lb (64.864 kg)  03/12/14 143 lb 4.8 oz (65 kg)    BMI:  Body mass index is 24.53 kg/(m^2).  Estimated Nutritional Needs:  Kcal:  1600-1800  Protein:  78 gr  Fluid:  1.6-1.8 liters daily  Skin:   WDL  Diet Order:  DIET - DYS 1 Room service appropriate?: Yes; Fluid consistency:: Nectar Thick  EDUCATION NEEDS: none at this time   Intake/Output Summary (Last 24 hours) at 09/04/14 1714 Last data filed at 09/04/14 1532  Gross per 24 hour  Intake   2380 ml  Output    180 ml  Net   2200 ml    Last BM:  09/03/14  Colman Cater MS,RD,CSG,LDN Office: [phone] Pager: [phone]

## 2014-09-04 NOTE — Care Management Note (Signed)
Case Management Note  Patient Details  Name: Arwyn Besaw MRN: [id number] Date of Birth: [birth year]  Expected Discharge Date:                  Expected Discharge Plan:  [place] Referral:  Clinical Social Work  Discharge planning Services  CM Consult  Post Acute Care Choice:  Durable Medical Equipment, Home Health Choice offered to:  Patient  DME Arranged:    DME Agency:  [place] [facility]  HH Arranged:    [facility] Agency:  [facility]  Status of Service:  Completed, signed off  Medicare Important Message Given:    Date Medicare IM Given:    Medicare IM give by:    Date Additional Medicare IM Given:    Additional Medicare Important Message give by:     If discussed at [place] of Stay Meetings, dates discussed:  09/04/2014  Additional Comments:  Sherald Barge, RN 09/04/2014, 2:52 PM

## 2014-09-04 NOTE — Progress Notes (Signed)
[facility] PROGRESS NOTE  Tina Hunter BSJ:[id number] DOB: 1924/05/04 DOA: 08/25/2014 PCP: No primary care provider on file.    Code Status:  Partial code  Family Communication: Discussed with patient's daughter, Vaughan Basta. Disposition Plan: To be determined   Consultants:  General surgeon, Dr. Arnoldo Morale  Procedures:  Percutaneous cholecystostomy tube insertion by IR on 08/29/14.  2-D echocardiogram 08/30/14:Study Conclusions - Left ventricle: The cavity size was normal. There was mild concentric hypertrophy. Systolic function was vigorous. The estimated ejection fraction was in the range of 65% to 70%. Wall motion was normal; there were no regional wall motion abnormalities. Doppler parameters are consistent with abnormal left ventricular relaxation (grade 1 diastolic dysfunction). Doppler parameters are consistent with high ventricular filling pressure. - Aortic valve: Mildly to moderately calcified annulus. Trileaflet. - Mitral valve: Mildly calcified annulus. Mildly calcified leaflets . There was mild regurgitation. - Left atrium: The atrium was moderately dilated. - Tricuspid valve: There was mild regurgitation.  Transthoracic echocardiography. M-mode, complete 2D, spectral Doppler, and color Doppler. Birthdate: Patient birthdate: Mar 22, 1925. Age: Patient is 81 yr old. Sex: Gender: female. BMI: 24.5 kg/m^2. Blood pressure:   110/78 Patient status: Inpatient. Study date: Study date: 08/30/2014. Study time: 08:21 AM. Location: Bedside.  Antibiotics:  Zyvox  Vancomycin, Rocephin, Flagyl, discontinued.  HPI/Subjective: The patient is laying in bed and tries to speak. She is semi-alert and sleepy.  Objective: Filed Vitals:   09/04/14 1234  BP: 157/58  Pulse: 66  Temp: 97.5 F (36.4 C)  Resp: 18   oxygen saturation 100% on nasal cannula oxygen.  Intake/Output Summary (Last 24 hours) at 09/04/14 1326 Last data filed at 09/04/14  1200  Gross per 24 hour  Intake    502 ml  Output    180 ml  Net    322 ml   Filed Weights   08/25/14 0935  Weight: 64.864 kg (143 lb)    Exam:   General:  Obese, alert, debilitated appearing 81 year old in no acute distress.  Cardiovascular: S1, S2, with a soft systolic murmur.  Respiratory: Coarse breath sounds and a few upper airway crackles.  Abdomen: Cholecystostomy tube noted at the right upper quadrant draining serous bilious fluid.  Positive bowel sounds, obese, nontender, nondistended.  Musculoskeletal/extremities: Mild flexion contracture of left upper extremity; no acute hot red joints; no pedal edema.   Neurologic: She is more sleepy/lethargic than yesterday; she attempts to speak, but is grossly dysarthric. She has a chronic left facial droop.  Data Reviewed: Basic Metabolic Panel:  Recent Labs Lab 08/30/14 0915 08/31/14 0601 09/01/14 0430 09/02/14 0654 09/03/14 0613  NA 147* 146* 142 132* 135  K 3.5 3.1* 3.0* 3.0* 3.7  CL 113* 112* 109 99* 105  CO2 22 26 26 26 28   GLUCOSE 101* 121* 150* 361* 165*  BUN 24* 22* 17 11 7   CREATININE 1.97* 1.77* 1.60* 1.33* 1.30*  CALCIUM 8.1* 8.0* 7.7* 7.5* 8.0*  MG  --  1.5*  --  1.4*  --    Liver Function Tests:  Recent Labs Lab 08/29/14 0433  AST 32  ALT 34  ALKPHOS 207*  BILITOT 0.6  PROT 5.6*  ALBUMIN 2.1*   No results for input(s): LIPASE, AMYLASE in the last 168 hours. No results for input(s): AMMONIA in the last 168 hours. CBC:  Recent Labs Lab 08/30/14 0915 08/31/14 0601 09/01/14 0430 09/02/14 0654 09/03/14 0613  WBC 14.4* 12.9* 12.2* 11.5* 12.0*  HGB 9.3* 8.6* 8.2* 8.0* 8.6*  HCT 28.3* 26.6* 25.5* 24.2* 25.7*  MCV 96.9 96.7 97.3 95.3 95.2  PLT 202 218 231 249 284   Cardiac Enzymes: No results for input(s): CKTOTAL, CKMB, CKMBINDEX, TROPONINI in the last 168 hours. BNP (last 3 results) No results for input(s): BNP in the last 8760 hours.  ProBNP (last 3 results) No results for  input(s): PROBNP in the last 8760 hours.  CBG:  Recent Labs Lab 09/03/14 0734 09/03/14 1136 09/03/14 1707 09/03/14 2216 09/04/14 1237  GLUCAP 159* 190* 142* 144* 172*    Recent Results (from the past 240 hour(s))  Culture, blood (x 2)     Status: None   Collection Time: 08/25/14  8:00 PM  Result Value Ref Range Status   Specimen Description BLOOD PICC LINE DRAWN BY RN RL  Final   Special Requests BOTTLES DRAWN AEROBIC AND ANAEROBIC 6CC  Final   Culture   Final    VANCOMYCIN RESISTANT ENTEROCOCCUS ISOLATED Note: CRITICAL RESULT CALLED TO, READ BACK BY AND VERIFIED WITH: MISTY MCDANIEL @ 9:24 AM 08/30/14 BY DWEEKS Performed at [facility]    Report Status 08/30/2014 FINAL  Final   Organism ID, Bacteria VANCOMYCIN RESISTANT ENTEROCOCCUS ISOLATED  Final      Susceptibility   Vancomycin resistant enterococcus isolated - MIC*    AMPICILLIN >=32 RESISTANT Resistant     VANCOMYCIN >256 RESISTANT Resistant     TETRACYCLINE <=1 SENSITIVE Sensitive     LINEZOLID 2 SENSITIVE Sensitive     * VANCOMYCIN RESISTANT ENTEROCOCCUS ISOLATED  Culture, blood (routine x 2)     Status: None   Collection Time: 08/28/14 10:00 AM  Result Value Ref Range Status   Specimen Description BLOOD PORTA CATH DRAWN BY RN  Final   Special Requests   Final    BOTTLES DRAWN AEROBIC AND ANAEROBIC AEB=8CC ANA=6CC   Culture NO GROWTH 5 DAYS  Final   Report Status 09/02/2014 FINAL  Final  Culture, blood (routine x 2)     Status: None   Collection Time: 08/28/14 10:18 AM  Result Value Ref Range Status   Specimen Description BLOOD LEFT HAND  Final   Special Requests BOTTLES DRAWN AEROBIC AND ANAEROBIC 6CC  Final   Culture NO GROWTH 5 DAYS  Final   Report Status 09/02/2014 FINAL  Final  Body fluid culture     Status: None   Collection Time: 08/29/14  2:55 PM  Result Value Ref Range Status   Specimen Description GALL BLADDER CHOLECYSTOSTOMY TUBE  Final   Special Requests NONE  Final   Gram Stain    Final    NO WBC SEEN FEW GRAM POSITIVE COCCI IN PAIRS IN CLUSTERS RARE GRAM NEGATIVE RODS Gram Stain Report Called to,Read Back By and Verified With: Gram Stain Report Called to,Read Back By and Verified With: MISTY MCDANIEL AT 46 ON [id number] BY WEBSP Performed at [facility]    Culture   Final    MODERATE KLEBSIELLA PNEUMONIAE VANCOMYCIN RESISTANT ENTEROCOCCUS ISOLATED Note: CRITICAL RESULT CALLED TO, READ BACK BY AND VERIFIED WITH: VAL DEESE @ 8099 ON [id number] BY [facility] Performed at [facility]    Report Status 09/02/2014 FINAL  Final   Organism ID, Bacteria KLEBSIELLA PNEUMONIAE  Final   Organism ID, Bacteria VANCOMYCIN RESISTANT ENTEROCOCCUS ISOLATED  Final      Susceptibility   Klebsiella pneumoniae - MIC*    AMPICILLIN >=32 RESISTANT Resistant     AMPICILLIN/SULBACTAM 8 SENSITIVE Sensitive     CEFAZOLIN <=4 SENSITIVE Sensitive     CEFEPIME <=1 SENSITIVE  Sensitive     CEFTAZIDIME <=1 SENSITIVE Sensitive     CEFTRIAXONE <=1 SENSITIVE Sensitive     CIPROFLOXACIN <=0.25 SENSITIVE Sensitive     GENTAMICIN <=1 SENSITIVE Sensitive     IMIPENEM <=0.25 SENSITIVE Sensitive     PIP/TAZO 8 SENSITIVE Sensitive     TOBRAMYCIN <=1 SENSITIVE Sensitive     TRIMETH/SULFA <=20 SENSITIVE Sensitive     * MODERATE KLEBSIELLA PNEUMONIAE   Vancomycin resistant enterococcus isolated - MIC*    AMPICILLIN >=32 RESISTANT Resistant     VANCOMYCIN 256 RESISTANT Resistant     LINEZOLID 2 SENSITIVE Sensitive     * VANCOMYCIN RESISTANT ENTEROCOCCUS ISOLATED     Studies: No results found.  Scheduled Meds: . antiseptic oral rinse  7 mL Mouth Rinse BID  . heparin  5,000 Units Subcutaneous 3 times per day  . insulin aspart  0-5 Units Subcutaneous QHS  . insulin aspart  0-9 Units Subcutaneous TID WC  . levETIRAcetam  500 mg Intravenous Q12H  . levothyroxine  25 mcg Intravenous Daily  . linezolid  600 mg Intravenous Q12H  . metoprolol  5 mg Intravenous 4 times per day  . polyvinyl  alcohol  1 drop Both Eyes TID   Continuous Infusions: . dextrose 5 % and 0.9 % NaCl with KCl 20 mEq/L 60 mL/hr at 09/03/14 1812   Assessment and plan:  Principal Problem:   Sepsis secondary to UTI Active Problems:   Cholecystitis, acute   Acidosis   Bacteremia due to vancomycin resistant Enterococcus   CVA, old, hemiparesis   Dysphagia, oropharyngeal   Weakness generalized   Seizure disorder   Acute renal failure   Essential hypertension   Dementia   Leukocytosis   Diabetes mellitus with complication   Acute diastolic heart failure   1. Escherichia coli urinary tract infection. The patient was started on Rocephin empirically for a grossly abnormal/infected urinalysis. The urine culture eventually grew out Escherichia coli which was sensitive to Rocephin. She received several days of treatment with Rocephin before it was discontinued. She is now on Zyvox for treatment of VRE bacteremia.  Acute cholecystitis with associated VRE bacteremia. On admission, CT scan of the patient's abdomen and pelvis revealed findings consistent with acute cholecystitis and gallstones. Subsequently, ultrasound of the abdomen was ordered and revealed findings suspicious for acute cholecystitis with gallbladder wall thickening and debris/stones within the gallbladder. HIDA scan revealed nonvisualization of the gallbladder, consistent with acute cholecystitis. Vancomycin and Flagyl were added empirically, but were eventually discontinued when the blood culture became positive for VRE. Following a discussion with ID, Dr. Linus Salmons by Dr. Jerilee Hoh, Zyvox was started. The planned course is for 14 days. Gen. surgeon, Dr. Arnoldo Morale was consulted. All were in agreement that the patient was high risk for surgical intervention. Therefore, a cholecystostomy tube was inserted by IR for conservative treatment on 5/20. We'll follow with Dr. Arnoldo Morale for further recommendations.  Sepsis and lactic acidosis secondary to  urinary tract infection, acute cholecystitis/VRE bacteremia. We'll continue treatment as stated above. Clinically, sepsis has resolved. Her blood pressure has improved and she is mildly hypertensive. Her white blood cell count was 33.3 on admission and has improved progressively.  Mild acute diastolic heart failure. Patient's follow-up chest x-ray revealed pulmonary edema. This was felt to be in part secondary to vigorous IV fluid hydration for sepsis, following admission. 2-D echocardiogram revealed grade 1 diastolic dysfunction and EF of 65-70%. She was treated with IV Lasix with some diuresis before it was discontinued. -She  has some pulmonary crackles in the upper airways on exam. Will order a follow-up chest x-ray.  Acute renal failure; nonoliguric. Her creatinine was 2.55 on admission. Her creatinine 5-6 months ago was 1.70, which indicates she likely has chronic kidney disease. Her creatinine has improved overall.  Seizure disorder. Because the patient has severe dysphagia and is nothing by mouth, Tegretol was discontinued in favor of IV Keppra. Apparently, the patient has received Keppra at 500 mg IV every 12 hours. Because of her continued sedation, I discussed the dosing with Dr. Merlene Laughter, neurologist. Per our curbside conversation, the dose may be more than she needs and could be contributing to her lethargy. Therefore, the dose will be decreased to 250 mg IV every 12 hours with the new dose starting tomorrow evening on 09/05/2014. Currently stable.  Essential hypertension. Her oral medication is on hold secondary to dysphasia. Will continue IV Lopressor.  Type 2 diabetes mellitus. Her CBGs are currently reasonable and mostly less than 200. We'll continue sliding scale NovoLog.  Hypomagnesemia and hypokalemia. Both are being repleted as needed. We'll add potassium chloride to maintenance fluids.  Dysphagia. Per speech therapy, the patient's mental status is such that she does  not recommend a diet yet. We'll continue dextrose and IV fluids. On 09/03/2014, I discussed with the patient's daughter about the possibility that she may need a PEG tube if that's what the patient or family would want just in case she is unable to regain enough swallowing function to eat. She stated that she would have to discuss it with her sister and other family members.     Time spent: 30 minutes    [facility] Pager [phone]. If 7PM-7AM, please contact night-coverage at [email or web address], password [facility] 09/04/2014, 1:26 PM  LOS: 10 days

## 2014-09-04 NOTE — Progress Notes (Signed)
Speech Language Pathology Treatment: Dysphagia  Patient Details Name: Tina Hunter MRN: [id number] DOB: Mar 12, 1925 Today's Date: 09/04/2014 Time: 6073-7106 SLP Time Calculation (min) (ACUTE ONLY): 23 min  Assessment / Plan / Recommendation Clinical Impression  [name]'s level of alertness has improved again today. Although her speech is difficult to understand, she is more talkative and responsive. She is able to protrude and lateralize her tongue a little more today and follows commands with a delay. She was agreeable to po trials after oral care and demonstrated improved lingual control (still very delayed and she needs a lot of cueing). Her intake improved when she was guided into self feeding. Her dysphagia appears to be mostly cognitively based and as her alertness improves, her swallow improves. She has not eaten for over a week so I think starting a diet as soon as she is able to tolerate is prudent- would recommend D1/puree and nectar-thick liquids with aspiration and reflux precautions. She will need feeder assist and may only eat small amounts at one time. Recommend SNF upon discharge. Above to RN. PO meds crushed in puree.   HPI Other Pertinent Information: Tina Hunter is a 81 y.o. female who presented [facility] with AMS found to have UTI, ARF, sepsis and acute cholecystitis s/p HIDA. The patient is lethargic and confused and history is obtained per chart review and from the patient's daughter. IR received request for percutaneous Cholecystostomy since patient is too high risk for surgery (completed 08/29/2014). Pt now afebrile with WBC count decreasing. Pt was more alert yesterday (5/22) and swallow evaluation was ordered.    Pertinent Vitals Pain Assessment: No/denies pain  SLP Plan  Continue with current plan of care    Recommendations Diet recommendations: Dysphagia 1 (puree);Nectar-thick liquid Liquids provided via: Cup Medication Administration: Crushed with puree Supervision:  Staff to assist with self feeding;Full supervision/cueing for compensatory strategies;Trained caregiver to feed patient Compensations: Slow rate;Small sips/bites;Check for pocketing (check oral cavity to see that it is clear) Postural Changes and/or Swallow Maneuvers: Seated upright 90 degrees;Upright 30-60 min after meal              Oral Care Recommendations: Oral care BID;Staff/trained caregiver to provide oral care Follow up Recommendations: Skilled Nursing facility Plan: Continue with current plan of care   Thank you,  Genene Churn, [place]      [place] 09/04/2014, 3:31 PM

## 2014-09-04 NOTE — Clinical Social Work Note (Signed)
CSW spoke with Adonis Brook at [facility] regarding patient's placement. Adonis Brook indicated that they would review patient's clinicals, contact patient's daughter and get back with CSW regarding bed availability.     Ihor Gully, [place]

## 2014-09-05 ENCOUNTER — Inpatient Hospital Stay (HOSPITAL_COMMUNITY): Payer: Medicare Other

## 2014-09-05 DIAGNOSIS — E118 Type 2 diabetes mellitus with unspecified complications: Secondary | ICD-10-CM

## 2014-09-05 DIAGNOSIS — I69359 Hemiplegia and hemiparesis following cerebral infarction affecting unspecified side: Secondary | ICD-10-CM

## 2014-09-05 LAB — CBC
HEMATOCRIT: 26.2 % — AB (ref 36.0–46.0)
Hemoglobin: 8.5 g/dL — ABNORMAL LOW (ref 12.0–15.0)
MCH: 31.4 pg (ref 26.0–34.0)
MCHC: 32.4 g/dL (ref 30.0–36.0)
MCV: 96.7 fL (ref 78.0–100.0)
Platelets: 323 10*3/uL (ref 150–400)
RBC: 2.71 MIL/uL — AB (ref 3.87–5.11)
RDW: 13.8 % (ref 11.5–15.5)
WBC: 10 10*3/uL (ref 4.0–10.5)

## 2014-09-05 LAB — BASIC METABOLIC PANEL
ANION GAP: 9 (ref 5–15)
CALCIUM: 7.8 mg/dL — AB (ref 8.9–10.3)
CHLORIDE: 101 mmol/L (ref 101–111)
CO2: 30 mmol/L (ref 22–32)
Creatinine, Ser: 1.2 mg/dL — ABNORMAL HIGH (ref 0.44–1.00)
GFR calc Af Amer: 46 mL/min — ABNORMAL LOW (ref >60)
GFR calc non Af Amer: 39 mL/min — ABNORMAL LOW (ref >60)
GLUCOSE: 156 mg/dL — AB (ref 65–99)
POTASSIUM: 3.5 mmol/L (ref 3.5–5.1)
Sodium: 140 mmol/L (ref 135–145)

## 2014-09-05 LAB — GLUCOSE, CAPILLARY
GLUCOSE-CAPILLARY: 123 mg/dL — AB (ref 65–99)
Glucose-Capillary: 214 mg/dL — ABNORMAL HIGH (ref 65–99)
Glucose-Capillary: 128 mg/dL — ABNORMAL HIGH (ref 65–99)

## 2014-09-05 NOTE — Care Management Note (Signed)
Case Management Note  Patient Details  Name: Tina Hunter MRN: [id number] Date of Birth: 11/28/1924  Subjective/Objective:                    Action/Plan:   Expected Discharge Date:                  Expected Discharge Plan:  [place]  In-House Referral:  Clinical Social Work  Discharge planning Services  CM Consult  Post Acute Care Choice:  Durable Medical Equipment, Home Health Choice offered to:  Patient  DME Arranged:    DME Agency:  [employer]  HH Arranged:    [facility] Facility Agency:  [facility]  Status of Service:  Completed, signed off  Medicare Important Message Given:  Yes Date Medicare IM Given:  09/05/14 Medicare IM give by:  Christinia Gully, RN BSN CM Date Additional Medicare IM Given:    Additional Medicare Important Message give by:     If discussed at [place] of Stay Meetings, dates discussed:    Additional Comments: Pt is potential discharge over the weekend to [facility] in [place]. CSW to arrange discharge to facility. Christinia Gully [place], RN 09/05/2014, 11:56 AM

## 2014-09-05 NOTE — Progress Notes (Signed)
[facility] PROGRESS NOTE  Tina Hunter DJM:[id number] DOB: [birth year] DOA: 08/25/2014 PCP: No primary care provider on file.    Code Status:  Partial code  Family Communication: Discussed with patient's daughter Disposition Plan: Likely discharge in 2-3 days.   Consultants:  General surgeon, Dr. Arnoldo Morale  Procedures:  Percutaneous cholecystostomy tube insertion by IR on 08/29/14.  2-D echocardiogram 08/30/14:Study Conclusions - Left ventricle: The cavity size was normal. There was mild concentric hypertrophy. Systolic function was vigorous. The estimated ejection fraction was in the range of 65% to 70%. Wall motion was normal; there were no regional wall motion abnormalities. Doppler parameters are consistent with abnormal left ventricular relaxation (grade 1 diastolic dysfunction). Doppler parameters are consistent with high ventricular filling pressure. - Aortic valve: Mildly to moderately calcified annulus. Trileaflet. - Mitral valve: Mildly calcified annulus. Mildly calcified leaflets . There was mild regurgitation. - Left atrium: The atrium was moderately dilated. - Tricuspid valve: There was mild regurgitation.  Transthoracic echocardiography. M-mode, complete 2D, spectral Doppler, and color Doppler. Birthdate: Patient birthdate: [birth year]. Age: Patient is 81 yr old. Sex: Gender: female. BMI: 24.5 kg/m^2. Blood pressure:   110/78 Patient status: Inpatient. Study date: Study date: 08/30/2014. Study time: 08:21 AM. Location: Bedside.  Antibiotics:  Zyvox>>  Vancomycin, Rocephin, Flagyl, discontinued.  HPI/Subjective: The patient is laying in bed and tries to speak. She is semi-alert and sleepy.  Objective: Filed Vitals:   09/05/14 0523  BP: 116/64  Pulse: 69  Temp: 97.4 F (36.3 C)  Resp: 18   oxygen saturation 94% on room air.  Intake/Output Summary (Last 24 hours) at 09/05/14 1358 Last data filed at 09/05/14  0743  Gross per 24 hour  Intake   1398 ml  Output    150 ml  Net   1248 ml   Filed Weights   08/25/14 0935  Weight: 64.864 kg (143 lb)    Exam:   General:  Obese, alert, debilitated appearing 81 year old in no acute distress. She is sitting up in the recliner and is more alert than yesterday.  Cardiovascular: S1, S2, with a soft systolic murmur.  Respiratory: Coarse breath sounds and a few upper airway crackles, partially cleared with  Abdomen: Cholecystostomy tube noted at the right upper quadrant draining serous bilious fluid.  Positive bowel sounds, obese, nontender, nondistended.  Musculoskeletal/extremities: Mild flexion contracture of left upper extremity; no acute hot red joints; no pedal edema.   Neurologic: She is more alert and is sitting up in the chair. She is grossly/severely dysarthric. She appears to have a right facial droop and right tongue deviation (not sure if this is chronic or new as the neuro exam is better today with the patient sitting up in the chair).  Data Reviewed: Basic Metabolic Panel:  Recent Labs Lab 08/31/14 0601 09/01/14 0430 09/02/14 0654 09/03/14 2229 09/05/14 0612  NA 146* 142 132* 135 140  K 3.1* 3.0* 3.0* 3.7 3.5  CL 112* 109 99* 105 101  CO2 26 26 26 28 30   GLUCOSE 121* 150* 361* 165* 156*  BUN 22* 17 11 7  <5*  CREATININE 1.77* 1.60* 1.33* 1.30* 1.20*  CALCIUM 8.0* 7.7* 7.5* 8.0* 7.8*  MG 1.5*  --  1.4*  --   --    Liver Function Tests: No results for input(s): AST, ALT, ALKPHOS, BILITOT, PROT, ALBUMIN in the last 168 hours. No results for input(s): LIPASE, AMYLASE in the last 168 hours. No results for input(s): AMMONIA in the last 168 hours. CBC:  Recent Labs  Lab 08/31/14 0601 09/01/14 0430 09/02/14 0654 09/03/14 0613 09/05/14 0612  WBC 12.9* 12.2* 11.5* 12.0* 10.0  HGB 8.6* 8.2* 8.0* 8.6* 8.5*  HCT 26.6* 25.5* 24.2* 25.7* 26.2*  MCV 96.7 97.3 95.3 95.2 96.7  PLT 218 231 249 284 323   Cardiac Enzymes: No  results for input(s): CKTOTAL, CKMB, CKMBINDEX, TROPONINI in the last 168 hours. BNP (last 3 results) No results for input(s): BNP in the last 8760 hours.  ProBNP (last 3 results) No results for input(s): PROBNP in the last 8760 hours.  CBG:  Recent Labs Lab 09/04/14 1237 09/04/14 1632 09/04/14 2231 09/05/14 0745 09/05/14 1110  GLUCAP 172* 104* 159* 123* 214*    Recent Results (from the past 240 hour(s))  Culture, blood (routine x 2)     Status: None   Collection Time: 08/28/14 10:00 AM  Result Value Ref Range Status   Specimen Description BLOOD PORTA CATH DRAWN BY RN  Final   Special Requests   Final    BOTTLES DRAWN AEROBIC AND ANAEROBIC AEB=8CC ANA=6CC   Culture NO GROWTH 5 DAYS  Final   Report Status 09/02/2014 FINAL  Final  Culture, blood (routine x 2)     Status: None   Collection Time: 08/28/14 10:18 AM  Result Value Ref Range Status   Specimen Description BLOOD LEFT HAND  Final   Special Requests BOTTLES DRAWN AEROBIC AND ANAEROBIC 6CC  Final   Culture NO GROWTH 5 DAYS  Final   Report Status 09/02/2014 FINAL  Final  Body fluid culture     Status: None   Collection Time: 08/29/14  2:55 PM  Result Value Ref Range Status   Specimen Description GALL BLADDER CHOLECYSTOSTOMY TUBE  Final   Special Requests NONE  Final   Gram Stain   Final    NO WBC SEEN FEW GRAM POSITIVE COCCI IN PAIRS IN CLUSTERS RARE GRAM NEGATIVE RODS Gram Stain Report Called to,Read Back By and Verified With: Gram Stain Report Called to,Read Back By and Verified With: MISTY MCDANIEL AT 81 ON [id number] BY WEBSP Performed at [facility]    Culture   Final    MODERATE KLEBSIELLA PNEUMONIAE VANCOMYCIN RESISTANT ENTEROCOCCUS ISOLATED Note: CRITICAL RESULT CALLED TO, READ BACK BY AND VERIFIED WITH: VAL DEESE @ 0909 ON [id number] BY [facility] Performed at [facility]    Report Status 09/02/2014 FINAL  Final   Organism ID, Bacteria KLEBSIELLA PNEUMONIAE  Final   Organism ID, Bacteria  VANCOMYCIN RESISTANT ENTEROCOCCUS ISOLATED  Final      Susceptibility   Klebsiella pneumoniae - MIC*    AMPICILLIN >=32 RESISTANT Resistant     AMPICILLIN/SULBACTAM 8 SENSITIVE Sensitive     CEFAZOLIN <=4 SENSITIVE Sensitive     CEFEPIME <=1 SENSITIVE Sensitive     CEFTAZIDIME <=1 SENSITIVE Sensitive     CEFTRIAXONE <=1 SENSITIVE Sensitive     CIPROFLOXACIN <=0.25 SENSITIVE Sensitive     GENTAMICIN <=1 SENSITIVE Sensitive     IMIPENEM <=0.25 SENSITIVE Sensitive     PIP/TAZO 8 SENSITIVE Sensitive     TOBRAMYCIN <=1 SENSITIVE Sensitive     TRIMETH/SULFA <=20 SENSITIVE Sensitive     * MODERATE KLEBSIELLA PNEUMONIAE   Vancomycin resistant enterococcus isolated - MIC*    AMPICILLIN >=32 RESISTANT Resistant     VANCOMYCIN 256 RESISTANT Resistant     LINEZOLID 2 SENSITIVE Sensitive     * VANCOMYCIN RESISTANT ENTEROCOCCUS ISOLATED     Studies: Dg Chest Port 1 View  09/04/2014   CLINICAL  DATA:  Acute heart failure. Renal failure, sepsis, urinary tract infection and cholecystitis. History of diabetes, hypertension, stroke and brain tumor.  EXAM: PORTABLE CHEST - 1 VIEW  COMPARISON:  08/28/2014 and 08/25/2014.  FINDINGS: 1425 hours. Patient is rotated to the right. Right arm PICC appears unchanged at the lower SVC level. Allowing for the rotation, the heart size and mediastinal contours are stable. There are persistent low lung volumes with bibasilar atelectasis and possible mild edema. No confluent airspace opacity or significant pleural effusion identified.  IMPRESSION: No significant change in bibasilar atelectasis and possible mild edema.   Electronically Signed   By: Richardean Sale M.D.   On: 09/04/2014 14:51    Scheduled Meds: . antiseptic oral rinse  7 mL Mouth Rinse BID  . heparin  5,000 Units Subcutaneous 3 times per day  . insulin aspart  0-5 Units Subcutaneous QHS  . insulin aspart  0-9 Units Subcutaneous TID WC  . levETIRAcetam  250 mg Intravenous Q12H  . levothyroxine  25 mcg  Intravenous Daily  . linezolid  600 mg Intravenous Q12H  . metoprolol  5 mg Intravenous 4 times per day  . polyvinyl alcohol  1 drop Both Eyes TID   Continuous Infusions: . dextrose 5 % and 0.9 % NaCl with KCl 20 mEq/L 60 mL/hr at 09/05/14 7858   Assessment and plan:  Principal Problem:   Sepsis secondary to UTI Active Problems:   Cholecystitis, acute   Acidosis   Bacteremia due to vancomycin resistant Enterococcus   CVA, old, hemiparesis   Dysphagia, oropharyngeal   Weakness generalized   Seizure disorder   Acute renal failure   Essential hypertension   Dementia   Leukocytosis   Diabetes mellitus with complication   Acute diastolic heart failure   1. Escherichia coli urinary tract infection. The patient was started on Rocephin empirically for a grossly abnormal/infected urinalysis. The urine culture eventually grew out Escherichia coli which was sensitive to Rocephin. She received several days of treatment with Rocephin before it was discontinued. She is now on Zyvox for treatment of VRE bacteremia.  Acute cholecystitis with associated VRE bacteremia. On admission, CT scan of the patient's abdomen and pelvis revealed findings consistent with acute cholecystitis and gallstones. Subsequently, ultrasound of the abdomen was ordered and revealed findings suspicious for acute cholecystitis with gallbladder wall thickening and debris/stones within the gallbladder. HIDA scan revealed nonvisualization of the gallbladder, consistent with acute cholecystitis. Vancomycin and Flagyl were added empirically, but were eventually discontinued when the blood culture became positive for VRE. Following a discussion with ID, Dr.Comer by Dr. Jerilee Hoh, Zyvox was started. The planned course is for 14 days. Gen. surgeon, Dr. Arnoldo Morale was consulted. All were in agreement that the patient was high risk for surgical intervention. Therefore, a cholecystostomy tube was inserted by IR for conservative  treatment on 5/20. -The cholecystostomy tube is draining serious bilious fluid without apparent cloudiness or purulence. -We'll follow up with Dr. Arnoldo Morale for further recommendations; discussed briefly with Dr. Arnoldo Morale who stated that the tube will likely need to stay in a total of 2-3 weeks.  Sepsis and lactic acidosis secondary to urinary tract infection, acute cholecystitis/VRE bacteremia. We'll continue treatment as stated above. Clinically, sepsis has resolved. Her blood pressure has improved. Her white blood cell count was 33.3 on admission and has improved progressively.  Mild acute diastolic heart failure. Patient's previous follow-up chest x-ray revealed pulmonary edema. This was felt to be in part secondary to vigorous IV fluid hydration for sepsis,  following admission. 2-D echocardiogram revealed grade 1 diastolic dysfunction and EF of 65-70%. She was treated with IV Lasix with some diuresis before it was discontinued. Subsequent follow-up chest x-ray revealed minimal bibasilar atelectasis and possible mild edema. We'll continue current management.  Acute renal failure; nonoliguric. Her creatinine was 2.55 on admission. Her creatinine 5-6 months ago was 1.70, which indicates she likely has chronic kidney disease. Her creatinine has improved overall. Her urine output is nonoliguric.  Seizure disorder. Because the patient has severe dysphagia and is nothing by mouth, Tegretol was discontinued in favor of IV Keppra. Apparently, the patient had received Keppra at 500 mg IV every 12 hours for several days. Because of her continued sedation, I discussed the dosing with Dr. Merlene Laughter, neurologist. Per our curbside conversation on 09/04/14, the dose may be more than she needs and could be contributing to her lethargy. Therefore, the dose was decreased to 250 mg IV every 12 hours. She appears to be more alert today. Will restart oral Tegretol when she is clinically able to.  Essential  hypertension. Her oral medication is on hold secondary to dysphasia. Will continue IV Lopressor.  Type 2 diabetes mellitus. Her CBGs are currently reasonable and mostly less than 200. We'll continue sliding scale NovoLog.  Hypomagnesemia and hypokalemia. Both are being repleted as needed. Potassium chloride was added to maintenance IV fluids.  Hypothyroidism. We'll continue IV Synthroid.  Dysphagia. Per speech therapy, the patient's mental status was such that she did not recommend a diet initially. Speech therapist, Ms. Starleen Blue, evaluated the patient again on 5/26 and felt that the patient was alert enough to try dysphagia 1/nectar thick liquids diet. According to her daughter, the patient ate all of her oatmeal without any signs of dysphagia or coughing. We'll continue to follow. On 09/03/2014, I discussed with the patient's daughter about the possibility that she may need a PEG tube if that's what the patient or family would want just in case she is unable to regain enough swallowing function to eat. She stated that she would have to discuss it with her sister and other family members.  Previous history of severe stroke with left-sided hemiparesis. The patient has significant dysarthria which may be secondary to her chronic previous stroke, but her daughter states that she has been more dysarthric than usual which was attributed to sepsis and now possibly higher dose Keppra.. She appears to have a right-sided facial droop and right tongue deviation, but is not clear if this is new or have been present previously, but not noticed on exam as the patient was mostly supine while examined. Although it may not change management, I will order a noncontrasted head CT to assess for interval findings.    Time spent: 30 minutes    [facility] Pager [phone]. If 7PM-7AM, please contact night-coverage at [email or web address], password [facility] 09/05/2014, 1:58 PM  LOS: 11 days

## 2014-09-05 NOTE — Progress Notes (Signed)
Physical Therapy Treatment Patient Details Name: Tina Hunter MRN: [id number] DOB: 21-May-1924 Today's Date: 09/05/2014    History of Present Illness HPI: Tina Hunter is a very pleasant 81 y.o. 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 Initial evaluation he feels early sepsis in the setting of urinary tract infection.    PT Comments    Pt was somewhat drowsy but able to awaken and cooperate.  There is noted to be significant improvement in overall mobility.  She is now holding her head in a neutral position instead of over to the right and she is able to turn her head and look to the right.  She is able to participate with therapeutic bed exercise with right extremities.  Little to no strength noted in the LLE.  She was transferred to seated position at the EOB and pt was ultimately able to sit independently for about 30 seconds.  I have asked nursing service to transfer her to a recliner chair using the Maxi Lift at some point today.  Follow Up Recommendations  SNF     Equipment Recommendations  None recommended by PT    Recommendations for Other Services  OT     Precautions / Restrictions Precautions Precautions: Fall Restrictions Weight Bearing Restrictions: No    Mobility  Bed Mobility Overal bed mobility: Needs Assistance Bed Mobility: Supine to Sit;Sit to Supine     Supine to sit: Total assist;HOB elevated Sit to supine: Total assist   General bed mobility comments: Pt was assisted to sit at edge of bed whereupon sitting balance was facilitated...she tends to fall left but after sitting for 5 minutes with min guarding she was able to sit independently                                                           Balance  Overall balance assessment: Needs assistance Sitting-balance support: Single extremity supported;Feet supported Sitting balance-Leahy Scale: Fair                              Cognition Arousal/Alertness: Lethargic Behavior During Therapy: Flat affect Overall Cognitive Status: History of cognitive impairments - at baseline                      Exercises General Exercises - Upper Extremity Shoulder Flexion: AAROM;Right;5 reps;Supine Elbow Flexion: AAROM;Right;5 reps;Supine Elbow Extension: AAROM;Right;5 reps;Supine General Exercises - Lower Extremity Ankle Circles/Pumps: AAROM;Both;10 reps;Supine Short Arc Quad: AAROM;10 reps;Supine Heel Slides: AAROM;Both;10 reps;Supine Hip ABduction/ADduction: AAROM;Both;10 reps;Supine            Pertinent Vitals/Pain Pain Assessment: No/denies pain                                      PT Goals (current goals can now be found in the care plan section) Progress towards PT goals: Progressing toward goals    Frequency  Min 3X/week    PT Plan Current plan remains appropriate  End of Session   Activity Tolerance: Patient tolerated treatment well Patient left: in bed;with call bell/phone within reach;with bed alarm set;with family/visitor present     Time: 7972-8206 PT Time Calculation (min) (ACUTE ONLY): 28 min  Charges:  $Therapeutic Exercise: 8-22 mins $Therapeutic Activity: 8-22 mins                    G CodesSable Feil  PT 09/05/2014, 10:55 AM [phone]

## 2014-09-05 NOTE — Clinical Social Work Note (Signed)
CSW received call from [facility] stating that they could not take pt because of VRE. CSW notified pt's daughter Hoyle Sauer and discussed other facilities in [place]. Referral faxed to SNF in [place]. No beds at this time. Will follow up on Monday. MD aware.   Benay Pike, [place]

## 2014-09-05 NOTE — Clinical Social Work Note (Signed)
CSW received a voicemail message from [place] at [facility] offering patient a bed.   CSW spoke with patient and patient's daughter, Hoyle Sauer, and advised that [facility] had made a bed offer.  Patient's daughter accepted the bed offer.    CSW left a message for Adonis Brook at [facility] and advised that patient/family had accepted the bed offer.     Ihor Gully, [place]

## 2014-09-06 DIAGNOSIS — R1312 Dysphagia, oropharyngeal phase: Secondary | ICD-10-CM

## 2014-09-06 LAB — GLUCOSE, CAPILLARY
GLUCOSE-CAPILLARY: 100 mg/dL — AB (ref 65–99)
GLUCOSE-CAPILLARY: 119 mg/dL — AB (ref 65–99)
GLUCOSE-CAPILLARY: 100 mg/dL — AB (ref 65–99)
Glucose-Capillary: 162 mg/dL — ABNORMAL HIGH (ref 65–99)
Glucose-Capillary: 139 mg/dL — ABNORMAL HIGH (ref 65–99)

## 2014-09-06 NOTE — Progress Notes (Signed)
[facility] PROGRESS NOTE  Dejana Pugsley IWO:[id number] DOB: 14-Dec-1924 DOA: 08/25/2014 PCP: No primary care provider on file.    Code Status:  Partial code  Family Communication: Discussed with patient's daughters Baker Janus and [name] Disposition Plan: Likely discharge to a skilled nursing facility, LTAC, or possibly home.   Consultants:  General surgeon, Dr. Arnoldo Morale  Procedures:  Percutaneous cholecystostomy tube insertion by IR on 08/29/14.  2-D echocardiogram 08/30/14:Study Conclusions - Left ventricle: The cavity size was normal. There was mild concentric hypertrophy. Systolic function was vigorous. The estimated ejection fraction was in the range of 65% to 70%. Wall motion was normal; there were no regional wall motion abnormalities. Doppler parameters are consistent with abnormal left ventricular relaxation (grade 1 diastolic dysfunction). Doppler parameters are consistent with high ventricular filling pressure. - Aortic valve: Mildly to moderately calcified annulus. Trileaflet. - Mitral valve: Mildly calcified annulus. Mildly calcified leaflets . There was mild regurgitation. - Left atrium: The atrium was moderately dilated. - Tricuspid valve: There was mild regurgitation.  Transthoracic echocardiography. M-mode, complete 2D, spectral Doppler, and color Doppler. Birthdate: Patient birthdate: July 04, 1924. Age: Patient is 81 yr old. Sex: Gender: female. BMI: 24.5 kg/m^2. Blood pressure:   110/78 Patient status: Inpatient. Study date: Study date: 08/30/2014. Study time: 08:21 AM. Location: Bedside.  Antibiotics:  Zyvox>>  Vancomycin, Rocephin, Flagyl, discontinued.  HPI/Subjective: The patient is sitting up in bed, alert, in no acute distress.  Objective: Filed Vitals:   09/06/14 0615  BP: 145/39  Pulse: 72  Temp: 98.1 F (36.7 C)  Resp: 18   oxygen saturation 100% on nasal cannula oxygen.  Intake/Output Summary (Last 24  hours) at 09/06/14 1125 Last data filed at 09/06/14 0818  Gross per 24 hour  Intake   2608 ml  Output    130 ml  Net   2478 ml   Filed Weights   08/25/14 0935  Weight: 64.864 kg (143 lb)    Exam:   General:  Obese, alert, debilitated appearing 81 year old in no acute distress. She is sitting up in the bed.  Cardiovascular: S1, S2, with a soft systolic murmur.  Respiratory: Coarse breath sounds and a few upper airway crackles, partially cleared with  Abdomen: Cholecystostomy tube noted at the right upper quadrant draining serous bilious fluid.  Positive bowel sounds, obese, nontender, nondistended.  Musculoskeletal/extremities: Mild flexion contracture of left upper extremity; no acute hot red joints; no pedal edema.   Neurologic: She is alert and is sitting up in bed. She is leaning to the left and it appears that she now has a left facial droop rather than a right facial droop. Her tongue is no longer deviated to the right. She is grossly/severely dysarthric.   Data Reviewed: Basic Metabolic Panel:  Recent Labs Lab 08/31/14 0601 09/01/14 0430 09/02/14 0654 09/03/14 5003 09/05/14 0612  NA 146* 142 132* 135 140  K 3.1* 3.0* 3.0* 3.7 3.5  CL 112* 109 99* 105 101  CO2 26 26 26 28 30   GLUCOSE 121* 150* 361* 165* 156*  BUN 22* 17 11 7  <5*  CREATININE 1.77* 1.60* 1.33* 1.30* 1.20*  CALCIUM 8.0* 7.7* 7.5* 8.0* 7.8*  MG 1.5*  --  1.4*  --   --    Liver Function Tests: No results for input(s): AST, ALT, ALKPHOS, BILITOT, PROT, ALBUMIN in the last 168 hours. No results for input(s): LIPASE, AMYLASE in the last 168 hours. No results for input(s): AMMONIA in the last 168 hours. CBC:  Recent Labs Lab 08/31/14 0601  09/01/14 0430 09/02/14 0654 09/03/14 7858 09/05/14 0612  WBC 12.9* 12.2* 11.5* 12.0* 10.0  HGB 8.6* 8.2* 8.0* 8.6* 8.5*  HCT 26.6* 25.5* 24.2* 25.7* 26.2*  MCV 96.7 97.3 95.3 95.2 96.7  PLT 218 231 249 284 323   Cardiac Enzymes: No results for input(s):  CKTOTAL, CKMB, CKMBINDEX, TROPONINI in the last 168 hours. BNP (last 3 results) No results for input(s): BNP in the last 8760 hours.  ProBNP (last 3 results) No results for input(s): PROBNP in the last 8760 hours.  CBG:  Recent Labs Lab 09/05/14 0745 09/05/14 1110 09/05/14 1636 09/05/14 2214 09/06/14 0747  GLUCAP 123* 214* 128* 100* 119*    Recent Results (from the past 240 hour(s))  Culture, blood (routine x 2)     Status: None   Collection Time: 08/28/14 10:00 AM  Result Value Ref Range Status   Specimen Description BLOOD PORTA CATH DRAWN BY RN  Final   Special Requests   Final    BOTTLES DRAWN AEROBIC AND ANAEROBIC AEB=8CC ANA=6CC   Culture NO GROWTH 5 DAYS  Final   Report Status 09/02/2014 FINAL  Final  Culture, blood (routine x 2)     Status: None   Collection Time: 08/28/14 10:18 AM  Result Value Ref Range Status   Specimen Description BLOOD LEFT HAND  Final   Special Requests BOTTLES DRAWN AEROBIC AND ANAEROBIC 6CC  Final   Culture NO GROWTH 5 DAYS  Final   Report Status 09/02/2014 FINAL  Final  Body fluid culture     Status: None   Collection Time: 08/29/14  2:55 PM  Result Value Ref Range Status   Specimen Description GALL BLADDER CHOLECYSTOSTOMY TUBE  Final   Special Requests NONE  Final   Gram Stain   Final    NO WBC SEEN FEW GRAM POSITIVE COCCI IN PAIRS IN CLUSTERS RARE GRAM NEGATIVE RODS Gram Stain Report Called to,Read Back By and Verified With: Gram Stain Report Called to,Read Back By and Verified With: MISTY MCDANIEL AT 72 ON [id number] BY WEBSP Performed at [facility]    Culture   Final    MODERATE KLEBSIELLA PNEUMONIAE VANCOMYCIN RESISTANT ENTEROCOCCUS ISOLATED Note: CRITICAL RESULT CALLED TO, READ BACK BY AND VERIFIED WITH: VAL DEESE @ 0909 ON [id number] BY [facility] Performed at [facility]    Report Status 09/02/2014 FINAL  Final   Organism ID, Bacteria KLEBSIELLA PNEUMONIAE  Final   Organism ID, Bacteria VANCOMYCIN RESISTANT  ENTEROCOCCUS ISOLATED  Final      Susceptibility   Klebsiella pneumoniae - MIC*    AMPICILLIN >=32 RESISTANT Resistant     AMPICILLIN/SULBACTAM 8 SENSITIVE Sensitive     CEFAZOLIN <=4 SENSITIVE Sensitive     CEFEPIME <=1 SENSITIVE Sensitive     CEFTAZIDIME <=1 SENSITIVE Sensitive     CEFTRIAXONE <=1 SENSITIVE Sensitive     CIPROFLOXACIN <=0.25 SENSITIVE Sensitive     GENTAMICIN <=1 SENSITIVE Sensitive     IMIPENEM <=0.25 SENSITIVE Sensitive     PIP/TAZO 8 SENSITIVE Sensitive     TOBRAMYCIN <=1 SENSITIVE Sensitive     TRIMETH/SULFA <=20 SENSITIVE Sensitive     * MODERATE KLEBSIELLA PNEUMONIAE   Vancomycin resistant enterococcus isolated - MIC*    AMPICILLIN >=32 RESISTANT Resistant     VANCOMYCIN 256 RESISTANT Resistant     LINEZOLID 2 SENSITIVE Sensitive     * VANCOMYCIN RESISTANT ENTEROCOCCUS ISOLATED     Studies: Ct Head Wo Contrast  09/05/2014   CLINICAL DATA:  Dysarthria  EXAM: CT HEAD WITHOUT CONTRAST  TECHNIQUE: Contiguous axial images were obtained from the base of the skull through the vertex without intravenous contrast.  COMPARISON:  08/25/2014  FINDINGS: Bony calvarium again demonstrates postoperative change. Diffuse atrophic changes are noted. No findings to suggest acute hemorrhage, acute infarction or space-occupying mass lesion are. Changes of prior infarct in the right middle cerebral artery distribution is noted. Scattered white matter ischemic changes noted as well.  IMPRESSION: No change from the prior exam. Chronic atrophic and ischemic changes are again seen.   Electronically Signed   By: Inez Catalina M.D.   On: 09/05/2014 20:11   Dg Chest Port 1 View  09/04/2014   CLINICAL DATA:  Acute heart failure. Renal failure, sepsis, urinary tract infection and cholecystitis. History of diabetes, hypertension, stroke and brain tumor.  EXAM: PORTABLE CHEST - 1 VIEW  COMPARISON:  08/28/2014 and 08/25/2014.  FINDINGS: 1425 hours. Patient is rotated to the right. Right arm PICC  appears unchanged at the lower SVC level. Allowing for the rotation, the heart size and mediastinal contours are stable. There are persistent low lung volumes with bibasilar atelectasis and possible mild edema. No confluent airspace opacity or significant pleural effusion identified.  IMPRESSION: No significant change in bibasilar atelectasis and possible mild edema.   Electronically Signed   By: Richardean Sale M.D.   On: 09/04/2014 14:51    Scheduled Meds: . antiseptic oral rinse  7 mL Mouth Rinse BID  . heparin  5,000 Units Subcutaneous 3 times per day  . insulin aspart  0-5 Units Subcutaneous QHS  . insulin aspart  0-9 Units Subcutaneous TID WC  . levETIRAcetam  250 mg Intravenous Q12H  . levothyroxine  25 mcg Intravenous Daily  . linezolid  600 mg Intravenous Q12H  . metoprolol  5 mg Intravenous 4 times per day  . polyvinyl alcohol  1 drop Both Eyes TID   Continuous Infusions: . dextrose 5 % and 0.9 % NaCl with KCl 20 mEq/L 60 mL/hr at 09/06/14 0307   Assessment and plan:  Principal Problem:   Sepsis secondary to UTI Active Problems:   Cholecystitis, acute   Acidosis   Bacteremia due to vancomycin resistant Enterococcus   CVA, old, hemiparesis   Dysphagia, oropharyngeal   Weakness generalized   Seizure disorder   Acute renal failure   Essential hypertension   Dementia   Leukocytosis   Diabetes mellitus with complication   Acute diastolic heart failure   1. Escherichia coli urinary tract infection. The patient was started on Rocephin empirically for a grossly abnormal/infected urinalysis. The urine culture eventually grew out Escherichia coli which was sensitive to Rocephin. She received several days of treatment with Rocephin before it was discontinued. She is now on Zyvox for treatment of VRE bacteremia. There are no longer any clinical signs of an active UTI.   Acute cholecystitis with associated VRE bacteremia. On admission, CT scan of the patient's abdomen and  pelvis revealed findings consistent with acute cholecystitis and gallstones. Subsequently, ultrasound of the abdomen was ordered and revealed findings suspicious for acute cholecystitis with gallbladder wall thickening and debris/stones within the gallbladder. HIDA scan revealed nonvisualization of the gallbladder, consistent with acute cholecystitis. Vancomycin and Flagyl were added empirically, but were eventually discontinued when the blood culture became positive for VRE. Following a discussion with ID, Dr.Comer by Dr. Jerilee Hoh, Zyvox was started. The planned course is for 14 days. Gen. surgeon, Dr. Arnoldo Morale was consulted. All were in agreement that the patient was  high risk for surgical intervention. Therefore, a cholecystostomy tube was inserted by IR for conservative treatment on 5/20. -The cholecystostomy tube is draining serious bilious fluid without apparent cloudiness or purulence. -Per Dr. Arnoldo Morale, the patient will need to have the tube/drain in for total of 4-6 weeks. -We'll order follow-up blood cultures 2 in the next day or 2.  Sepsis and lactic acidosis secondary to urinary tract infection, acute cholecystitis/VRE bacteremia. We'll continue treatment as stated above. Clinically, sepsis has resolved. Her blood pressure has improved. Her white blood cell count was 33.3 on admission and has improved progressively.  Mild acute diastolic heart failure. Patient's previous follow-up chest x-ray revealed pulmonary edema. This was felt to be in part secondary to vigorous IV fluid hydration for sepsis, following admission. 2-D echocardiogram revealed grade 1 diastolic dysfunction and EF of 65-70%. She was treated with IV Lasix with some diuresis before it was discontinued. Subsequent follow-up chest x-ray revealed minimal bibasilar atelectasis and possible mild edema. We'll continue current management.  Acute renal failure; nonoliguric. Her creatinine was 2.55 on admission. Her creatinine  5-6 months ago was 1.70, which indicates she likely has chronic kidney disease. Her creatinine has improved overall. Her urine output is nonoliguric.  Seizure disorder. Because the patient has severe dysphagia and is nothing by mouth, Tegretol was discontinued in favor of IV Keppra. Apparently, the patient had received Keppra at 500 mg IV every 12 hours for several days. Because of her continued sedation, I discussed the dosing with Dr. Merlene Laughter, neurologist. Per our curbside conversation on 09/04/14, the dose may be more than she needed and could have been contributing to her lethargy. Therefore, the dose was decreased to 250 mg IV every 12 hours. She appears to be more alert, but still dysarthric. Will restart oral Tegretol when she is clinically able to.  Essential hypertension. Her oral medication is on hold secondary to dysphasia. Will continue IV Lopressor.  Type 2 diabetes mellitus. Her CBGs are currently reasonable and mostly less than 200. We'll continue sliding scale NovoLog.  Hypomagnesemia and hypokalemia. Both are being repleted as needed. Potassium chloride was added to maintenance IV fluids.  Hypothyroidism. We'll continue IV Synthroid.  Dysphagia. Per speech therapy, the patient's mental status was such that she did not recommend a diet initially. Speech therapist, Ms. Starleen Blue, evaluated the patient again on 5/26 and felt that the patient was alert enough to try dysphagia 1/nectar thick liquids diet. According to her daughter, the patient ate all of her oatmeal again this morning without any signs of dysphagia or coughing. We'll continue to follow. -We'll hold off on giving her oral medications unless there are some that can be crushed. On 09/03/2014, I discussed with the patient's daughter about the possibility that she may need a PEG tube if that's what the patient or family would want just in case she is unable to regain enough swallowing function to eat. She stated that  she would have to discuss it with her sister and other family members.  Previous history of severe stroke with left-sided hemiparesis. The patient has significant dysarthria which may be secondary to her chronic previous stroke, but her daughter states that she has been more dysarthric than usual which was attributed to sepsis and now possibly higher dose Keppra.. On 5/27, she appeared to have had a right-sided facial droop and right tongue deviation. Follow-up CT scan of her head was ordered on 5/27 and it was unchanged. The right-sided facial findings could've been positional as she was  leaning to the right.     Time spent: 25 minutes    [facility] Pager [phone]. If 7PM-7AM, please contact night-coverage at [email or web address], password [email or web address] 09/06/2014, 11:25 AM  LOS: 12 days

## 2014-09-06 NOTE — Progress Notes (Signed)
Asked to see patient concerning cholecystostomy tube. It is still draining bilious material. Given her multiple medical problems, I would leave the drain in for 4-6 weeks.

## 2014-09-07 DIAGNOSIS — I10 Essential (primary) hypertension: Secondary | ICD-10-CM

## 2014-09-07 LAB — GLUCOSE, CAPILLARY
GLUCOSE-CAPILLARY: 105 mg/dL — AB (ref 65–99)
Glucose-Capillary: 130 mg/dL — ABNORMAL HIGH (ref 65–99)
Glucose-Capillary: 177 mg/dL — ABNORMAL HIGH (ref 65–99)

## 2014-09-07 MED ORDER — AMLODIPINE BESYLATE 5 MG PO TABS
10.0000 mg | ORAL_TABLET | Freq: Every day | ORAL | Status: DC
  Administered 2014-09-07 – 2014-09-09 (×3): 10 mg via ORAL
  Filled 2014-09-07 (×3): qty 2

## 2014-09-07 MED ORDER — CARBAMAZEPINE 100 MG PO CHEW
CHEWABLE_TABLET | ORAL | Status: AC
  Filled 2014-09-07: qty 4

## 2014-09-07 MED ORDER — CARBAMAZEPINE 100 MG PO CHEW
200.0000 mg | CHEWABLE_TABLET | Freq: Three times a day (TID) | ORAL | Status: DC
  Administered 2014-09-07 – 2014-09-09 (×6): 200 mg via ORAL
  Filled 2014-09-07 (×9): qty 2

## 2014-09-07 MED ORDER — LOSARTAN POTASSIUM 50 MG PO TABS
50.0000 mg | ORAL_TABLET | Freq: Every day | ORAL | Status: DC
  Administered 2014-09-07 – 2014-09-08 (×2): 50 mg via ORAL
  Filled 2014-09-07 (×2): qty 1

## 2014-09-07 NOTE — Progress Notes (Signed)
[facility] PROGRESS NOTE  Tina Hunter XBD:[id number] DOB: Jan 10, 1925 DOA: 08/25/2014 PCP: No primary care provider on file.    Code Status:  Partial code  Family Communication: Discussed with patient's daughter [name]. Disposition Plan: Likely discharge to a skilled nursing facility, LTAC, or possibly home.   Consultants:  General surgeon, Dr. Arnoldo Morale  Procedures:  Percutaneous cholecystostomy tube insertion by IR on 08/29/14.  2-D echocardiogram 08/30/14:Study Conclusions - Left ventricle: The cavity size was normal. There was mild concentric hypertrophy. Systolic function was vigorous. The estimated ejection fraction was in the range of 65% to 70%. Wall motion was normal; there were no regional wall motion abnormalities. Doppler parameters are consistent with abnormal left ventricular relaxation (grade 1 diastolic dysfunction). Doppler parameters are consistent with high ventricular filling pressure. - Aortic valve: Mildly to moderately calcified annulus. Trileaflet. - Mitral valve: Mildly calcified annulus. Mildly calcified leaflets . There was mild regurgitation. - Left atrium: The atrium was moderately dilated. - Tricuspid valve: There was mild regurgitation.  Transthoracic echocardiography. M-mode, complete 2D, spectral Doppler, and color Doppler. Birthdate: Patient birthdate: Jul 09, 1924. Age: Patient is 81 yr old. Sex: Gender: female. BMI: 24.5 kg/m^2. Blood pressure:   110/78 Patient status: Inpatient. Study date: Study date: 08/30/2014. Study time: 08:21 AM. Location: Bedside.  Antibiotics:  Zyvox>>  Vancomycin, Rocephin, Flagyl, discontinued.  HPI/Subjective: The patient is sitting up in bed, alert, in no acute distress. Her daughter states that she is more talkative and more alert and this was confirmed by the nurse tech. The patient denies pain or shortness of breath by stating "no".  Objective: Filed Vitals:    09/07/14 1508  BP: 190/62  Pulse: 72  Temp: 98.7 F (37.1 C)  Resp: 20   oxygen saturation 100% on nasal cannula oxygen.  Intake/Output Summary (Last 24 hours) at 09/07/14 1633 Last data filed at 09/07/14 1504  Gross per 24 hour  Intake 1365.5 ml  Output    140 ml  Net 1225.5 ml   Filed Weights   08/25/14 0935  Weight: 64.864 kg (143 lb)    Exam:   General:  Obese, alert, debilitated appearing 81 year old in no acute distress. She is sitting up in the bed. She is much more talkative and alert.  Cardiovascular: S1, S2, with a soft systolic murmur.  Respiratory: Coarse breath sounds, but no audible wheezes or crackles.  Abdomen: Cholecystostomy tube noted at the right upper quadrant draining serous bilious fluid.  Positive bowel sounds, obese, nontender, nondistended.  Musculoskeletal/extremities: Mild flexion contracture of left upper extremity; no acute hot red joints; no pedal edema.   Neurologic: She is alert and is sitting up in bed. Her speech is slightly less dysarthric. She is more talkative. She is hemiparetic on the left. Otherwise she moves her right arm and right leg upon request.   Data Reviewed: Basic Metabolic Panel:  Recent Labs Lab 09/01/14 0430 09/02/14 0654 09/03/14 0613 09/05/14 0612  NA 142 132* 135 140  K 3.0* 3.0* 3.7 3.5  CL 109 99* 105 101  CO2 26 26 28 30   GLUCOSE 150* 361* 165* 156*  BUN 17 11 7  <5*  CREATININE 1.60* 1.33* 1.30* 1.20*  CALCIUM 7.7* 7.5* 8.0* 7.8*  MG  --  1.4*  --   --    Liver Function Tests: No results for input(s): AST, ALT, ALKPHOS, BILITOT, PROT, ALBUMIN in the last 168 hours. No results for input(s): LIPASE, AMYLASE in the last 168 hours. No results for input(s): AMMONIA in the last 168  hours. CBC:  Recent Labs Lab 09/01/14 0430 09/02/14 0654 09/03/14 5809 09/05/14 0612  WBC 12.2* 11.5* 12.0* 10.0  HGB 8.2* 8.0* 8.6* 8.5*  HCT 25.5* 24.2* 25.7* 26.2*  MCV 97.3 95.3 95.2 96.7  PLT 231 249 284 323    Cardiac Enzymes: No results for input(s): CKTOTAL, CKMB, CKMBINDEX, TROPONINI in the last 168 hours. BNP (last 3 results) No results for input(s): BNP in the last 8760 hours.  ProBNP (last 3 results) No results for input(s): PROBNP in the last 8760 hours.  CBG:  Recent Labs Lab 09/06/14 1123 09/06/14 1655 09/06/14 2123 09/07/14 0740 09/07/14 1111  GLUCAP 162* 139* 100* 130* 177*    Recent Results (from the past 240 hour(s))  Body fluid culture     Status: None   Collection Time: 08/29/14  2:55 PM  Result Value Ref Range Status   Specimen Description GALL BLADDER CHOLECYSTOSTOMY TUBE  Final   Special Requests NONE  Final   Gram Stain   Final    NO WBC SEEN FEW GRAM POSITIVE COCCI IN PAIRS IN CLUSTERS RARE GRAM NEGATIVE RODS Gram Stain Report Called to,Read Back By and Verified With: Gram Stain Report Called to,Read Back By and Verified With: MISTY MCDANIEL AT [id number] ON [id number] BY WEBSP Performed at [facility]    Culture   Final    MODERATE KLEBSIELLA PNEUMONIAE VANCOMYCIN RESISTANT ENTEROCOCCUS ISOLATED Note: CRITICAL RESULT CALLED TO, READ BACK BY AND VERIFIED WITH: VAL DEESE @ 0909 ON [id number] BY [facility] Performed at [facility]    Report Status 09/02/2014 FINAL  Final   Organism ID, Bacteria KLEBSIELLA PNEUMONIAE  Final   Organism ID, Bacteria VANCOMYCIN RESISTANT ENTEROCOCCUS ISOLATED  Final      Susceptibility   Klebsiella pneumoniae - MIC*    AMPICILLIN >=32 RESISTANT Resistant     AMPICILLIN/SULBACTAM 8 SENSITIVE Sensitive     CEFAZOLIN <=4 SENSITIVE Sensitive     CEFEPIME <=1 SENSITIVE Sensitive     CEFTAZIDIME <=1 SENSITIVE Sensitive     CEFTRIAXONE <=1 SENSITIVE Sensitive     CIPROFLOXACIN <=0.25 SENSITIVE Sensitive     GENTAMICIN <=1 SENSITIVE Sensitive     IMIPENEM <=0.25 SENSITIVE Sensitive     PIP/TAZO 8 SENSITIVE Sensitive     TOBRAMYCIN <=1 SENSITIVE Sensitive     TRIMETH/SULFA <=20 SENSITIVE Sensitive     * MODERATE KLEBSIELLA  PNEUMONIAE   Vancomycin resistant enterococcus isolated - MIC*    AMPICILLIN >=32 RESISTANT Resistant     VANCOMYCIN 256 RESISTANT Resistant     LINEZOLID 2 SENSITIVE Sensitive     * VANCOMYCIN RESISTANT ENTEROCOCCUS ISOLATED     Studies: Ct Head Wo Contrast  09/05/2014   CLINICAL DATA:  Dysarthria  EXAM: CT HEAD WITHOUT CONTRAST  TECHNIQUE: Contiguous axial images were obtained from the base of the skull through the vertex without intravenous contrast.  COMPARISON:  08/25/2014  FINDINGS: Bony calvarium again demonstrates postoperative change. Diffuse atrophic changes are noted. No findings to suggest acute hemorrhage, acute infarction or space-occupying mass lesion are. Changes of prior infarct in the right middle cerebral artery distribution is noted. Scattered white matter ischemic changes noted as well.  IMPRESSION: No change from the prior exam. Chronic atrophic and ischemic changes are again seen.   Electronically Signed   By: Inez Catalina M.D.   On: 09/05/2014 20:11    Scheduled Meds: . amLODipine  10 mg Oral Daily  . antiseptic oral rinse  7 mL Mouth Rinse BID  . carbamazepine  200 mg Oral 3 times per day  . heparin  5,000 Units Subcutaneous 3 times per day  . insulin aspart  0-5 Units Subcutaneous QHS  . insulin aspart  0-9 Units Subcutaneous TID WC  . levETIRAcetam  250 mg Intravenous Q12H  . levothyroxine  25 mcg Intravenous Daily  . linezolid  600 mg Intravenous Q12H  . losartan  50 mg Oral Daily  . metoprolol  5 mg Intravenous 4 times per day  . polyvinyl alcohol  1 drop Both Eyes TID   Continuous Infusions: . dextrose 5 % and 0.9 % NaCl with KCl 20 mEq/L 50 mL/hr (09/06/14 2215)   Assessment and plan:  Principal Problem:   Sepsis secondary to UTI Active Problems:   Cholecystitis, acute   Acidosis   Bacteremia due to vancomycin resistant Enterococcus   CVA, old, hemiparesis   Dysphagia, oropharyngeal   Weakness generalized   Seizure disorder   Acute renal  failure   Essential hypertension   Dementia   Leukocytosis   Diabetes mellitus with complication   Acute diastolic heart failure   1. Escherichia coli urinary tract infection. The patient was started on Rocephin empirically for a grossly abnormal/infected urinalysis. The urine culture eventually grew out Escherichia coli which was sensitive to Rocephin. She received several days of treatment with Rocephin before it was discontinued. She is now on Zyvox for treatment of VRE bacteremia. There are no longer any clinical signs of an active UTI.  Acute cholecystitis with associated VRE bacteremia. On admission, CT scan of the patient's abdomen and pelvis revealed findings consistent with acute cholecystitis and gallstones. Subsequently, ultrasound of the abdomen was ordered and revealed findings suspicious for acute cholecystitis with gallbladder wall thickening and debris/stones within the gallbladder. HIDA scan revealed nonvisualization of the gallbladder, consistent with acute cholecystitis. Vancomycin and Flagyl were added empirically, but were eventually discontinued when the blood culture became positive for VRE. Following a discussion with ID, Dr.Comer by Dr. Jerilee Hoh, Zyvox was started. The planned course is for 14 days. Gen. surgeon, Dr. Arnoldo Morale was consulted. All were in agreement that the patient was high risk for surgical intervention. Therefore, a cholecystostomy tube was inserted by IR for conservative treatment on 5/20. -The cholecystostomy tube is draining serious bilious fluid without apparent cloudiness or purulence. -Per Dr. Arnoldo Morale, the patient will need to have the tube/drain in for total of 4-6 weeks. -We'll order follow-up blood cultures 2 on 5/30.  Sepsis and lactic acidosis secondary to urinary tract infection, acute cholecystitis/VRE bacteremia. We'll continue treatment as stated above. Clinically, sepsis has resolved. Her blood pressure has improved. Her white blood  cell count was 33.3 on admission and has improved progressively.  Mild acute diastolic heart failure. Patient's previous follow-up chest x-ray revealed pulmonary edema. This was felt to be in part secondary to vigorous IV fluid hydration for sepsis, following admission. 2-D echocardiogram revealed grade 1 diastolic dysfunction and EF of 65-70%. She was treated with IV Lasix with some diuresis before it was discontinued. Subsequent follow-up chest x-ray revealed minimal bibasilar atelectasis and possible mild edema. We'll continue current management and give when necessary Lasix.  Acute renal failure; nonoliguric. Her creatinine was 2.55 on admission. Her creatinine 5-6 months ago was 1.70, which indicates she likely has chronic kidney disease. Her creatinine has improved overall. Her urine output is nonoliguric.  Seizure disorder. Because the patient has severe dysphagia and is nothing by mouth, Tegretol was discontinued in favor of IV Keppra. Apparently, the patient had received Keppra  at 500 mg IV every 12 hours for several days. Because of her continued sedation, I discussed the dosing with Dr. Merlene Laughter, neurologist. Per our curbside conversation on 09/04/14, the dose may be more than she needed and could have been contributing to her lethargy. Therefore, the dose was decreased to 250 mg IV every 12 hours. She appears to be more alert, and slightly less dysarthric. Will restart oral Tegretol crushed in her pured foods or applesauce. Will discontinue Keppra.  Essential hypertension. Her oral medications were withheld secondary to dysphagia. Will continue IV Lopressor. Now that her blood pressure has increased progressively, will try amlodipine and losartan crushed in applesauce as the patient's dysphagia appears to be decreasing. If she can tolerate these medications, then will start giving atenolol by mouth.  Type 2 diabetes mellitus. Her CBGs are currently reasonable and mostly less than  200. We'll continue sliding scale NovoLog.  Hypomagnesemia and hypokalemia. Both are being repleted as needed. Potassium chloride was added to maintenance IV fluids.  Hypothyroidism. We'll continue IV Synthroid.  Dysphagia. Per speech therapy, the patient's mental status was such that she did not recommend a diet initially. Speech therapist, Ms. Starleen Blue, evaluated the patient again on 5/26 and felt that the patient was alert enough to try dysphagia 1/nectar thick liquids diet. According to her daughter, the patient ate all of her oatmeal again this morning without any signs of dysphagia or coughing. We'll continue to follow. -We'll try a few medications crushed in applesauce or her pured food to see if she can tolerate it. On 09/03/2014, I discussed with the patient's daughter about the possibility that she may need a PEG tube if that's what the patient or family would want just in case she is unable to regain enough swallowing function to eat. She stated that she would have to discuss it with her sister and other family members.  Previous history of severe stroke with left-sided hemiparesis. The patient has significant dysarthria which may be secondary to her chronic previous stroke, but her daughter states that she has been more dysarthric than usual which was attributed to sepsis and now possibly higher dose Keppra.. On 5/27, she appeared to have had a right-sided facial droop and right tongue deviation. Follow-up CT scan of her head was ordered on 5/27 and it was unchanged. The right-sided facial findings could've been positional as she was leaning to the right.     Time spent: 25 minutes    [facility] Pager [phone]. If 7PM-7AM, please contact night-coverage at [email or web address], password [facility] 09/07/2014, 4:33 PM  LOS: 13 days

## 2014-09-08 LAB — GLUCOSE, CAPILLARY
Glucose-Capillary: 114 mg/dL — ABNORMAL HIGH (ref 65–99)
Glucose-Capillary: 189 mg/dL — ABNORMAL HIGH (ref 65–99)
Glucose-Capillary: 104 mg/dL — ABNORMAL HIGH (ref 65–99)
Glucose-Capillary: 168 mg/dL — ABNORMAL HIGH (ref 65–99)

## 2014-09-08 LAB — COMPREHENSIVE METABOLIC PANEL
ALT: 14 U/L (ref 14–54)
AST: 20 U/L (ref 15–41)
Albumin: 2.4 g/dL — ABNORMAL LOW (ref 3.5–5.0)
Alkaline Phosphatase: 69 U/L (ref 38–126)
Anion gap: 10 (ref 5–15)
BUN: 5 mg/dL — ABNORMAL LOW (ref 6–20)
CALCIUM: 7.6 mg/dL — AB (ref 8.9–10.3)
CHLORIDE: 105 mmol/L (ref 101–111)
CO2: 27 mmol/L (ref 22–32)
CREATININE: 1.18 mg/dL — AB (ref 0.44–1.00)
GFR calc Af Amer: 47 mL/min — ABNORMAL LOW (ref >60)
GFR calc non Af Amer: 40 mL/min — ABNORMAL LOW (ref >60)
GLUCOSE: 392 mg/dL — AB (ref 65–99)
Potassium: 5.2 mmol/L — ABNORMAL HIGH (ref 3.5–5.1)
Sodium: 142 mmol/L (ref 135–145)
TOTAL PROTEIN: 5.5 g/dL — AB (ref 6.5–8.1)
Total Bilirubin: 0.3 mg/dL (ref 0.3–1.2)

## 2014-09-08 LAB — CBC
HEMATOCRIT: 25.7 % — AB (ref 36.0–46.0)
Hemoglobin: 8.1 g/dL — ABNORMAL LOW (ref 12.0–15.0)
MCH: 30.8 pg (ref 26.0–34.0)
MCHC: 31.5 g/dL (ref 30.0–36.0)
MCV: 97.7 fL (ref 78.0–100.0)
Platelets: 316 10*3/uL (ref 150–400)
RBC: 2.63 MIL/uL — ABNORMAL LOW (ref 3.87–5.11)
RDW: 14 % (ref 11.5–15.5)
WBC: 9.2 10*3/uL (ref 4.0–10.5)

## 2014-09-08 MED ORDER — FUROSEMIDE 10 MG/ML IJ SOLN
20.0000 mg | Freq: Once | INTRAMUSCULAR | Status: AC
  Administered 2014-09-08: 20 mg via INTRAVENOUS
  Filled 2014-09-08: qty 2

## 2014-09-08 MED ORDER — LEVOTHYROXINE SODIUM 50 MCG PO TABS
50.0000 ug | ORAL_TABLET | Freq: Every day | ORAL | Status: DC
  Administered 2014-09-09: 50 ug via ORAL
  Filled 2014-09-08: qty 1

## 2014-09-08 MED ORDER — ATENOLOL 25 MG PO TABS
50.0000 mg | ORAL_TABLET | Freq: Every day | ORAL | Status: DC
  Administered 2014-09-08 – 2014-09-09 (×2): 50 mg via ORAL
  Filled 2014-09-08 (×2): qty 2

## 2014-09-08 MED ORDER — DONEPEZIL HCL 5 MG PO TABS
5.0000 mg | ORAL_TABLET | Freq: Every day | ORAL | Status: DC
  Administered 2014-09-08: 5 mg via ORAL
  Filled 2014-09-08: qty 1

## 2014-09-08 MED ORDER — INSULIN ASPART 100 UNIT/ML ~~LOC~~ SOLN: 0.0000 [IU] | Freq: Every day | SUBCUTANEOUS | Status: DC

## 2014-09-08 MED ORDER — INSULIN ASPART 100 UNIT/ML ~~LOC~~ SOLN
0.0000 [IU] | Freq: Three times a day (TID) | SUBCUTANEOUS | Status: DC
  Administered 2014-09-09 (×2): 2 [IU] via SUBCUTANEOUS

## 2014-09-08 MED ORDER — DEXTROSE-NACL 5-0.9 % IV SOLN
INTRAVENOUS | Status: DC
  Administered 2014-09-08 – 2014-09-09 (×2): via INTRAVENOUS

## 2014-09-08 NOTE — Progress Notes (Signed)
[facility] PROGRESS NOTE  Tina Hunter EQA:[id number] DOB: 18-Feb-1925 DOA: 08/25/2014 PCP: No primary care provider on file.    Code Status:  Partial code  Family Communication: Discussed with patient's daughter Tina Hunter. Disposition Plan: Likely discharge to a skilled nursing facility, LTAC, or possibly home. Likely discharge in the next 24-48 hours.   Consultants:  General surgeon, Dr. Arnoldo Morale  Procedures:  Percutaneous cholecystostomy tube insertion by IR on 08/29/14.  2-D echocardiogram 08/30/14:Study Conclusions - Left ventricle: The cavity size was normal. There was mild concentric hypertrophy. Systolic function was vigorous. The estimated ejection fraction was in the range of 65% to 70%. Wall motion was normal; there were no regional wall motion abnormalities. Doppler parameters are consistent with abnormal left ventricular relaxation (grade 1 diastolic dysfunction). Doppler parameters are consistent with high ventricular filling pressure. - Aortic valve: Mildly to moderately calcified annulus. Trileaflet. - Mitral valve: Mildly calcified annulus. Mildly calcified leaflets . There was mild regurgitation. - Left atrium: The atrium was moderately dilated. - Tricuspid valve: There was mild regurgitation.  Transthoracic echocardiography. M-mode, complete 2D, spectral Doppler, and color Doppler. Birthdate: Patient birthdate: Dec 11, 1924. Age: Patient is 81 yr old. Sex: Gender: female. BMI: 24.5 kg/m^2. Blood pressure:   110/78 Patient status: Inpatient. Study date: Study date: 08/30/2014. Study time: 08:21 AM. Location: Bedside.  Antibiotics:  Zyvox 5/21>>  Vancomycin, Rocephin, Flagyl, discontinued.  HPI/Subjective: The patient is is napping this afternoon. Her daughter reports that she ate all of her breakfast.  Objective: Filed Vitals:   09/08/14 1141  BP: 148/66  Pulse:   Temp:   Resp:    oxygen saturation 100% on nasal  cannula oxygen.  Intake/Output Summary (Last 24 hours) at 09/08/14 1312 Last data filed at 09/08/14 0800  Gross per 24 hour  Intake 1285.83 ml  Output    140 ml  Net 1145.83 ml   Filed Weights   08/25/14 0935  Weight: 64.864 kg (143 lb)    Exam:   General:  Obese, alert, debilitated appearing 81 year old in no acute distress. She is sleeping but easily arousable  Cardiovascular: S1, S2, with a soft systolic murmur.  Respiratory: Upper airway congestion; breathing nonlabored.  Abdomen: Cholecystostomy tube noted at the right upper quadrant draining serous bilious fluid.  Positive bowel sounds, obese, nontender, nondistended.  Musculoskeletal/extremities: Mild flexion contracture of left upper extremity; no acute hot red joints; no pedal edema.   Neurologic: She is sleeping but arousable to name. She has severe dysarthria.  Data Reviewed: Basic Metabolic Panel:  Recent Labs Lab 09/02/14 0654 09/03/14 9798 09/05/14 0612 09/08/14 0611  NA 132* 135 140 142  K 3.0* 3.7 3.5 5.2*  CL 99* 105 101 105  CO2 26 28 30 27   GLUCOSE 361* 165* 156* 392*  BUN 11 7 <5* 5*  CREATININE 1.33* 1.30* 1.20* 1.18*  CALCIUM 7.5* 8.0* 7.8* 7.6*  MG 1.4*  --   --   --    Liver Function Tests:  Recent Labs Lab 09/08/14 0611  AST 20  ALT 14  ALKPHOS 69  BILITOT 0.3  PROT 5.5*  ALBUMIN 2.4*   No results for input(s): LIPASE, AMYLASE in the last 168 hours. No results for input(s): AMMONIA in the last 168 hours. CBC:  Recent Labs Lab 09/02/14 0654 09/03/14 9211 09/05/14 0612 09/08/14 0611  WBC 11.5* 12.0* 10.0 9.2  HGB 8.0* 8.6* 8.5* 8.1*  HCT 24.2* 25.7* 26.2* 25.7*  MCV 95.3 95.2 96.7 97.7  PLT 249 284 323 316   Cardiac  Enzymes: No results for input(s): CKTOTAL, CKMB, CKMBINDEX, TROPONINI in the last 168 hours. BNP (last 3 results) No results for input(s): BNP in the last 8760 hours.  ProBNP (last 3 results) No results for input(s): PROBNP in the last 8760  hours.  CBG:  Recent Labs Lab 09/07/14 0740 09/07/14 1111 09/07/14 1700 09/08/14 0745 09/08/14 1126  GLUCAP 130* 177* 105* 114* 189*    Recent Results (from the past 240 hour(s))  Body fluid culture     Status: None   Collection Time: 08/29/14  2:55 PM  Result Value Ref Range Status   Specimen Description GALL BLADDER CHOLECYSTOSTOMY TUBE  Final   Special Requests NONE  Final   Gram Stain   Final    NO WBC SEEN FEW GRAM POSITIVE COCCI IN PAIRS IN CLUSTERS RARE GRAM NEGATIVE RODS Gram Stain Report Called to,Read Back By and Verified With: Gram Stain Report Called to,Read Back By and Verified With: MISTY MCDANIEL AT 6333 ON [id number] BY WEBSP Performed at [facility]    Culture   Final    MODERATE KLEBSIELLA PNEUMONIAE VANCOMYCIN RESISTANT ENTEROCOCCUS ISOLATED Note: CRITICAL RESULT CALLED TO, READ BACK BY AND VERIFIED WITH: VAL DEESE @ 0909 ON [id number] BY [facility] Performed at [facility]    Report Status 09/02/2014 FINAL  Final   Organism ID, Bacteria KLEBSIELLA PNEUMONIAE  Final   Organism ID, Bacteria VANCOMYCIN RESISTANT ENTEROCOCCUS ISOLATED  Final      Susceptibility   Klebsiella pneumoniae - MIC*    AMPICILLIN >=32 RESISTANT Resistant     AMPICILLIN/SULBACTAM 8 SENSITIVE Sensitive     CEFAZOLIN <=4 SENSITIVE Sensitive     CEFEPIME <=1 SENSITIVE Sensitive     CEFTAZIDIME <=1 SENSITIVE Sensitive     CEFTRIAXONE <=1 SENSITIVE Sensitive     CIPROFLOXACIN <=0.25 SENSITIVE Sensitive     GENTAMICIN <=1 SENSITIVE Sensitive     IMIPENEM <=0.25 SENSITIVE Sensitive     PIP/TAZO 8 SENSITIVE Sensitive     TOBRAMYCIN <=1 SENSITIVE Sensitive     TRIMETH/SULFA <=20 SENSITIVE Sensitive     * MODERATE KLEBSIELLA PNEUMONIAE   Vancomycin resistant enterococcus isolated - MIC*    AMPICILLIN >=32 RESISTANT Resistant     VANCOMYCIN 256 RESISTANT Resistant     LINEZOLID 2 SENSITIVE Sensitive     * VANCOMYCIN RESISTANT ENTEROCOCCUS ISOLATED     Studies: No results  found.  Scheduled Meds: . amLODipine  10 mg Oral Daily  . antiseptic oral rinse  7 mL Mouth Rinse BID  . carbamazepine  200 mg Oral 3 times per day  . heparin  5,000 Units Subcutaneous 3 times per day  . insulin aspart  0-5 Units Subcutaneous QHS  . insulin aspart  0-9 Units Subcutaneous TID WC  . levothyroxine  25 mcg Intravenous Daily  . linezolid  600 mg Intravenous Q12H  . losartan  50 mg Oral Daily  . metoprolol  5 mg Intravenous 4 times per day  . polyvinyl alcohol  1 drop Both Eyes TID   Continuous Infusions: . dextrose 5 % and 0.9% NaCl 50 mL/hr at 09/08/14 1100   Assessment and plan:  Principal Problem:   Sepsis secondary to UTI Active Problems:   Cholecystitis, acute   Acidosis   Bacteremia due to vancomycin resistant Enterococcus   CVA, old, hemiparesis   Dysphagia, oropharyngeal   Weakness generalized   Seizure disorder   Acute renal failure   Essential hypertension   Dementia   Leukocytosis   Diabetes mellitus  with complication   Acute diastolic heart failure   1. Escherichia coli urinary tract infection. The patient was started on Rocephin empirically for a grossly abnormal/infected urinalysis. The urine culture eventually grew out Escherichia coli which was sensitive to Rocephin. She received several days of treatment with Rocephin before it was discontinued. She is now on Zyvox for treatment of VRE bacteremia, day #9 of 14. There are no longer any clinical signs of an active UTI.  Acute cholecystitis with associated VRE bacteremia. On admission, CT scan of the patient's abdomen and pelvis revealed findings consistent with acute cholecystitis and gallstones. Subsequently, ultrasound of the abdomen was ordered and revealed findings suspicious for acute cholecystitis with gallbladder wall thickening and debris/stones within the gallbladder. HIDA scan revealed nonvisualization of the gallbladder, consistent with acute cholecystitis. Vancomycin and Flagyl were  added empirically, but were eventually discontinued when the blood culture became positive for VRE. Following a discussion with ID, Dr.Comer by Dr. Jerilee Hoh, Zyvox was started. The planned course is for 14 days. Gen. surgeon, Dr. Arnoldo Morale was consulted. All were in agreement that the patient was high risk for surgical intervention. Therefore, a cholecystostomy tube was inserted by IR for conservative treatment on 5/20. -The cholecystostomy tube is draining serious bilious fluid without apparent cloudiness or purulence. -Per Dr. Arnoldo Morale, the patient will need to have the tube/drain in for total of 4-6 weeks. -Follow-up blood cultures 2 on 5/30.  Sepsis and lactic acidosis secondary to urinary tract infection, acute cholecystitis/VRE bacteremia. We'll continue treatment as stated above. Clinically, sepsis has resolved. Her blood pressure has improved. Her white blood cell count was 33.3 on admission and has improved progressively.  Mild acute diastolic heart failure. Patient's previous follow-up chest x-ray revealed pulmonary edema. This was felt to be in part secondary to vigorous IV fluid hydration for sepsis, following admission. 2-D echocardiogram revealed grade 1 diastolic dysfunction and EF of 65-70%. She was treated with IV Lasix with some diuresis before it was discontinued. Subsequent follow-up chest x-ray revealed minimal bibasilar atelectasis and possible mild edema. We'll continue current management and give when necessary Lasix.  Acute renal failure; nonoliguric. Her creatinine was 2.55 on admission. Her creatinine 5-6 months ago was 1.70, which indicates she likely has chronic kidney disease. Her creatinine has improved overall. Her urine output is nonoliguric.  Seizure disorder. Because the patient has severe dysphagia and is nothing by mouth, Tegretol was discontinued in favor of IV Keppra. Apparently, the patient had received Keppra at 500 mg IV every 12 hours for several days.  Because of her continued sedation, I discussed the dosing with Dr. Merlene Laughter, neurologist. Per our curbside conversation on 09/04/14, the dose may be more than she needed and could have been contributing to her lethargy. Therefore, the dose was decreased to 250 mg IV every 12 hours. She appears to be more alert, and slightly less dysarthric. Oral Tegretol crushed in applesauce was restarted on night of 5/29 and Keppra was discontinued.  Essential hypertension. Her oral medications were withheld secondary to dysphagia. Will continue IV Lopressor. Now that her blood pressure has increased progressively, will try amlodipine and losartan crushed in applesauce as the patient's dysphagia appears to be decreasing. If she can tolerate these medications, then will start giving atenolol by mouth.  Type 2 diabetes mellitus. Her CBGs have been generally less than 200 with an isolated elevated blood sugar. Will change her sliding scale to moderate scale and add small dosing of daily at bedtime Lantus. We'll discontinue dextrose and IV  fluids when she is eating more reliably.  Hypomagnesemia and hypokalemia>>mild hyperkalemia Both are being repleted as needed. Potassium chloride was added to maintenance IV fluids.  Hypothyroidism. She was started on IV Synthroid. Will change it to by mouth to be crushed in food.  Dysphagia. Per speech therapy, the patient's mental status was such that she did not recommend a diet initially. Speech therapist, Ms. Starleen Blue, evaluated the patient on 5/26 and and felt that the patient was alert enough to try dysphagia 1/nectar thick liquids diet. According to her daughters, the patient has been tolerating her pured diet well. Of note, occasionally, one of her daughters will give the patient thin liquids from a sippy cup. I advised him not to do it unless it was thickened because of risk of aspiration. They voiced understanding. She appears to be tolerating the crushed medications  that were started.  Previous history of severe stroke with left-sided hemiparesis. The patient has significant dysarthria which may be secondary to her chronic previous stroke, but her daughter states that she has been more dysarthric than usual which was attributed to sepsis and now possibly higher dose Keppra.. On 5/27, she appeared to have had a right-sided facial droop and right tongue deviation. Follow-up CT scan of her head was ordered on 5/27 and it was unchanged. The right-sided facial findings could've been positional as she was leaning to the right.  Anemia of acute and chronic disease. No evidence of GI or GU bleeding. We'll continue to monitor and will transfuse only for hemodynamic instability or evidence of bleeding.  Mild hyperkalemia. The potassium and IV fluids were discontinued. We'll hold Cozaar until hers serum potassium improves. We'll give her 1 dose of IV Lasix.    Time spent: 25 minutes    [facility] Pager [phone]. If 7PM-7AM, please contact night-coverage at [email or web address], password [facility] 09/08/2014, 1:12 PM  LOS: 14 days

## 2014-09-08 NOTE — Progress Notes (Signed)
Speech Language Pathology Treatment: Dysphagia  Patient Details Name: Tina Hunter MRN: [id number] DOB: [birth year] Today's Date: 09/08/2014 Time: 9892-1194 SLP Time Calculation (min) (ACUTE ONLY): 23 min  Assessment / Plan / Recommendation Clinical Impression  SLP spoke to Dr. Caryn Section in the hallway who requested that SLP assess whether pt able to take pills whole at this time due to overall improvement. Pt was extremely lethargic when I attempted to see her and RN reported that she has been very sleepy today. Her daughter reportedly was able to rouse her sufficiently to feed her breakfast this AM. She was not present during my visit today unfortunately. [name] did open eyes to voicing and tactile cues and even opened her mouth for ice chips, but required max verbal and tactile cues to swallow this date. Recommend continuing D1/puree with NTL and crush pills in puree when pt is alert and upright. I do not recommend whole pills at this time.    HPI Other Pertinent Information: Tina Hunter is a 81 y.o. female who presented [facility] with AMS found to have UTI, ARF, sepsis and acute cholecystitis s/p HIDA. The patient is lethargic and confused and history is obtained per chart review and from the patient's daughter. IR received request for percutaneous Cholecystostomy since patient is too high risk for surgery (completed 08/29/2014). Pt now afebrile with WBC count decreasing. Pt was more alert yesterday (5/22) and swallow evaluation was ordered.    Pertinent Vitals Pain Assessment: No/denies pain  SLP Plan  Continue with current plan of care    Recommendations Diet recommendations: Dysphagia 1 (puree);Nectar-thick liquid Liquids provided via: Cup Medication Administration: Crushed with puree Supervision: Staff to assist with self feeding;Full supervision/cueing for compensatory strategies;Trained caregiver to feed patient Compensations: Slow rate;Small sips/bites;Check for pocketing Postural  Changes and/or Swallow Maneuvers: Seated upright 90 degrees;Upright 30-60 min after meal              Oral Care Recommendations: Oral care BID;Staff/trained caregiver to provide oral care Follow up Recommendations: 24 hour supervision/assistance;Skilled Nursing facility Plan: Continue with current plan of care    Thank you,  Genene Churn, [place]      [place] 09/08/2014, 11:38 AM

## 2014-09-08 NOTE — Progress Notes (Signed)
PT Cancellation Note  Patient Details Name: Hydee Fleece MRN: [id number] DOB: 26-Mar-1925   Cancelled Treatment:    Reason Eval/Treat Not Completed: Patient's level of consciousness.  Per daughter, pt was able to eat a full breakfast this AM but has been extremely somnolent since then.  I am unable to awaken her at this time.  Will try again tomorrow.   Demetrios Isaacs L 09/08/2014, 11:31 AM

## 2014-09-08 NOTE — Clinical Social Work Note (Signed)
CSW spoke with all [facility] facilities again today. [facility] can offer bed now. Pt's daughter Hoyle Sauer notified. They would like to see if anything becomes available at [facility] or [place] tomorrow before making decision, but would also be interested in starting  placement if not. CSW will follow up in AM.   Benay Pike, [place]

## 2014-09-09 LAB — BASIC METABOLIC PANEL
Anion gap: 10 (ref 5–15)
BUN: 8 mg/dL (ref 6–20)
CO2: 31 mmol/L (ref 22–32)
Calcium: 7.9 mg/dL — ABNORMAL LOW (ref 8.9–10.3)
Chloride: 101 mmol/L (ref 101–111)
Creatinine, Ser: 1.31 mg/dL — ABNORMAL HIGH (ref 0.44–1.00)
GFR calc Af Amer: 41 mL/min — ABNORMAL LOW (ref >60)
GFR calc non Af Amer: 35 mL/min — ABNORMAL LOW (ref >60)
Glucose, Bld: 123 mg/dL — ABNORMAL HIGH (ref 65–99)
Potassium: 4.2 mmol/L (ref 3.5–5.1)
SODIUM: 142 mmol/L (ref 135–145)

## 2014-09-09 LAB — GLUCOSE, CAPILLARY
GLUCOSE-CAPILLARY: 138 mg/dL — AB (ref 65–99)
Glucose-Capillary: 123 mg/dL — ABNORMAL HIGH (ref 65–99)

## 2014-09-09 MED ORDER — INSULIN ASPART 100 UNIT/ML ~~LOC~~ SOLN: SUBCUTANEOUS | Status: DC

## 2014-09-09 MED ORDER — INSULIN ASPART 100 UNIT/ML ~~LOC~~ SOLN: 0.0000 [IU] | Freq: Every day | SUBCUTANEOUS | Status: DC

## 2014-09-09 MED ORDER — MEMANTINE HCL 10 MG PO TABS: 10.0000 mg | ORAL_TABLET | Freq: Every day | ORAL | Status: AC

## 2014-09-09 MED ORDER — ALBUTEROL SULFATE (2.5 MG/3ML) 0.083% IN NEBU: 2.5000 mg | INHALATION_SOLUTION | RESPIRATORY_TRACT | Status: DC | PRN

## 2014-09-09 MED ORDER — LOSARTAN POTASSIUM 100 MG PO TABS: 50.0000 mg | ORAL_TABLET | Freq: Every day | ORAL | Status: DC

## 2014-09-09 MED ORDER — DONEPEZIL HCL 5 MG PO TABS: 5.0000 mg | ORAL_TABLET | Freq: Every day | ORAL | Status: DC

## 2014-09-09 MED ORDER — LINEZOLID 600 MG/300ML IV SOLN: 600.0000 mg | Freq: Two times a day (BID) | INTRAVENOUS | Status: DC

## 2014-09-09 MED ORDER — ASPIRIN 81 MG PO CHEW: 81.0000 mg | CHEWABLE_TABLET | Freq: Every day | ORAL | Status: AC

## 2014-09-09 NOTE — Clinical Social Work Placement (Signed)
   CLINICAL SOCIAL WORK PLACEMENT  NOTE  Date:  09/09/2014  Patient Details  Name: Tina Hunter MRN: [id number] Date of Birth: 1924/06/19  Clinical Social Work is seeking post-discharge placement for this patient at the [place] level of care (*CSW will initial, date and re-position this form in  chart as items are completed):  Yes   Patient/family provided with [facility] Work Department's list of facilities offering this level of care within the geographic area requested by the patient (or if unable, by the patient's family).  Yes   Patient/family informed of their freedom to choose among providers that offer the needed level of care, that participate in Medicare, Medicaid or managed care program needed by the patient, have an available bed and are willing to accept the patient.  Yes   Patient/family informed of [facility]'s ownership interest in [facility] and [facility], as well as of the fact that they are under no obligation to receive care at these facilities.  PASRR submitted to EDS on 09/09/14     PASRR number received on 09/09/14     Existing PASRR number confirmed on       FL2 transmitted to all facilities in geographic area requested by pt/family on 09/02/14     FL2 transmitted to all facilities within larger geographic area on 09/09/14     Patient informed that his/her managed care company has contracts with or will negotiate with certain facilities, including the following:   (yes)     Yes   Patient/family informed of bed offers received.  Patient chooses bed at  [facility])     Physician recommends and patient chooses bed at      Patient to be transferred to [facility] at [place] on 09/09/14.  Patient to be transferred to facility by [facility]     Patient family notified on 09/09/14 of transfer.  Name of family member notified:  Daughter, Hoyle Sauer     PHYSICIAN       Additional Comment:     _______________________________________________ Ihor Gully, LCSW 09/09/2014, 1:41 PM

## 2014-09-09 NOTE — Clinical Social Work Note (Addendum)
CSW met with spoke with Melody Comas of [facility] at [phone].  Ms. Nevada Crane advised that she they facility did not have any available beds. CSW advised both of patient's daughters that [facility] did not have bed availability.  CSW discussed other placement options with patient and daughters.  Both daughters advised that they would like to consider [facility].       Ihor Gully, [place]

## 2014-09-09 NOTE — Discharge Summary (Signed)
Physician Discharge Summary  Tina Hunter YIF:[id number] DOB: April 12, 1924 DOA: 08/25/2014  PCP: No primary care provider on file.  Admit date: 08/25/2014 Discharge date: 09/09/2014  Time spent: GREATER THAN 30 minutes  Recommendations for Outpatient Follow-up:  1. The patient should be continued on IV Linezolid after the last dose on 09/12/14. 2. The cholecystostomy tube should stay in until she follows up with Dr. Arnoldo Morale. Recommend tube care and monitoring biliary drainage output daily. 3. PICC line can be taken out per the discretion of the SNF physician. 4. Patient should be given a pured diet with nectar thickened liquids. Her medications should be crushed in applesauce or pudding or given as a suspension. Recommend follow-up speech therapy evaluation and 1 week or less. 5. Recommend checking the patient's blood sugars before each meal and at bedtime. 6. Patient is being discharged to [facility] skilled nursing facility.  Discharge Diagnoses:  1. Escherichia coli urinary tract infection. 2. Acute cholecystitis with associated VRE bacteremia. 3. Sepsis and lactic acidosis secondary to urinary tract infection and acute cholecystitis associated VRE bacteremia. 4. Mild acute diastolic heart failure. 5. Acute kidney injury, secondary to prerenal azotemia. 6. Seizure disorder. 7. Essential hypertension. 8. Type 2 diabetes mellitus. 9. Hypomagnesemia, hypokalemia, mild hyperkalemia. All resolved. 10. Hypothyroidism. 11. Obesity. 12. 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 13. Worsening oropharyngeal dysphagia. 14. Anemia of acute and chronic disease. 15. Probable vascular dementia. 16. Partial code.  Discharge Condition: Stable/improved.  Diet recommendation: Dysphagia with nectar thick liquids. Crush medications in applesauce or pudding or given as a suspension with food.  Filed Weights   08/25/14 0935 09/09/14 0700  Weight: 64.864 kg  (143 lb) 59.33 kg (130 lb 12.8 oz)    History of present illness:  The patient is an 81 year old woman with a history of a stroke in 1997 with residual left-sided hemiparesis, seizure disorder, diabetes mellitus, and hypertension. She presented to the emergency department on 08/25/14 with a report of altered mental status and lethargy as reported by her daughters. In the ED, her white blood cell count was 33.3; BUN 30; creatinine 2.55; glucose 208; lactic acid 2.96; and urinalysis with too numerous to count WBCs and many bacteria. She was admitted for further evaluation and management.  Hospital Course:   1. Escherichia coli urinary tract infection. The patient was started on Rocephin empirically for a grossly abnormal/infected urinalysis. The urine culture eventually grew out Escherichia coli which was sensitive to Rocephin. She received several days of treatment with Rocephin before it was discontinued. She was started on Zyvox for treatment of VRE bacteremia. There are no longer any clinical signs of an active UTI. Acute cholecystitis with associated VRE bacteremia. Following admission, CT scan of the patient's abdomen and pelvis revealed findings consistent with acute cholecystitis and gallstones. Subsequently, ultrasound of the abdomen was ordered and revealed findings suspicious for acute cholecystitis with gallbladder wall thickening and debris/stones within the gallbladder. HIDA scan revealed nonvisualization of the gallbladder, consistent with acute cholecystitis. Vancomycin and Flagyl were added empirically, but were eventually discontinued when the blood culture became positive for VRE. Following a discussion with ID, Dr.Comer by Dr. Jerilee Hoh, Zyvox was started. The planned course is for 14 days. Gen. surgeon, Dr. Arnoldo Morale was consulted. All were in agreement that the patient was high risk for surgical intervention. Therefore, a cholecystostomy tube was inserted by IR for conservative  treatment on 08/29/14. -The cholecystostomy tube is draining serious bilious fluid without cloudiness or purulence. -Per Dr.  Arnoldo Morale, the patient will need to have the tube/drain in for total of 4-6 weeks, then he will need to follow-up with her. -Follow-up blood cultures 2 on 09/08/14 are negative to date. -Patient is on day #11 of 14 of Zyvox treatment. It should be discontinued following the last dose on 09/12/14. Sepsis and lactic acidosis secondary to urinary tract infection, acute cholecystitis/VRE bacteremia. Lactic acidosis has resolved. Her white blood cell count is now within normal limits, down from 33.3 on admission. Clinically, sepsis has resolved. Mild acute diastolic heart failure. Patient's previous follow-up chest x-ray revealed pulmonary edema. This was felt to be in part secondary to vigorous IV fluid hydration for sepsis, following admission. 2-D echocardiogram revealed grade 1 diastolic dysfunction and EF of 65-70%. She was treated with IV Lasix with some diuresis before it was discontinued. Subsequent follow-up chest x-ray revealed minimal bibasilar atelectasis and possible mild edema. Recommend when necessary Lasix for clinical signs of heart failure. Acute renal failure; nonoliguric. Her creatinine was 2.55 on admission. Her creatinine 5-6 months ago was 1.70, which indicates she likely has chronic kidney disease. Her creatinine has improved overall. It was 1.31 at discharge. Her urine output was nonoliguric. Seizure disorder. Because the patient has severe dysphagia and was nothing by mouth, Tegretol was discontinued in favor of IV Keppra. Apparently, the patient had received Keppra at 500 mg IV every 12 hours for several days. Because of her continued sedation, I discussed the dosing with Dr. Merlene Laughter, neurologist. Per our curbside conversation on 09/04/14, the dose may have been more than she needed and could have been contributing to her lethargy. Therefore, the dose was  decreased to 250 mg IV every 12 hours. She became more alert, and slightly less dysarthric. Oral Tegretol crushed in applesauce was restarted on night of 5/29 and Keppra was discontinued. Essential hypertension. Her oral medications were withheld secondary to dysphagia. She was treated with IV metoprolol. Her oral medications were restarted and crushed and given in applesauce. She appears to be tolerating them well. Type 2 diabetes mellitus. Glipizide was discontinued. She was treated with sliding scale NovoLog during the hospital course. Adjustments in the dosing can be made at the skilled nursing facility as needed. Hypomagnesemia and hypokalemia>>mild hyperkalemia She was supplemented with IV potassium chloride and IV magnesium sulfate. Her serum potassium increased to slightly above normal range. She was given IV Lasix for treatment and potassium chloride supplement was stopped. Hypothyroidism. She was started on IV Synthroid. It was discontinued and her oral Synthroid was restarted. Dysphagia. Per speech therapy, the patient's mental status was such that she did not recommend a diet initially. Speech therapist, Ms. Starleen Blue, evaluated the patient on 5/26 and and felt that the patient was alert enough to try dysphagia 1/nectar thick liquids diet. According to her daughters, the patient has been tolerating her pured diet well. Of note, occasionally, one of her daughters will give the patient thin liquids from a sippy cup. I advised them not to do it unless it was thickened because of risk of aspiration. They voiced understanding. She appears to be tolerating the crushed medications in applesauce. Previous history of severe stroke with left-sided hemiparesis. The patient has significant dysarthria which may be secondary to her chronic previous stroke, but her daughter stated that she has been more dysarthric than usual which was attributed to sepsis and possibly  Keppra.. On 5/27, she appeared to  have had a right-sided facial droop and right tongue deviation. Follow-up CT scan of her head was  ordered on 5/27 and it was unchanged. The right-sided facial findings could've been positional as she was leaning to the right. Anemia of acute and chronic disease. No evidence of GI or GU bleeding. Her hemoglobin should continue to be monitored.    Procedures:  Percutaneous cholecystostomy tube insertion by IR on 08/29/14.  2-D echocardiogram 08/30/14:Study Conclusions - Left ventricle: The cavity size was normal. There was mild concentric hypertrophy. Systolic function was vigorous. The estimated ejection fraction was in the range of 65% to 70%. Wall motion was normal; there were no regional wall motion abnormalities. Doppler parameters are consistent with abnormal left ventricular relaxation (grade 1 diastolic dysfunction). Doppler parameters are consistent with high ventricular filling pressure. - Aortic valve: Mildly to moderately calcified annulus. Trileaflet. - Mitral valve: Mildly calcified annulus. Mildly calcified leaflets . There was mild regurgitation. - Left atrium: The atrium was moderately dilated. - Tricuspid valve: There was mild regurgitation.  Consultations:  Gen. surgery, Dr. Arnoldo Morale  Discharge Exam: Filed Vitals:   09/09/14 1354  BP: 162/63  Pulse: 84  Temp: 98.6 F (37 C)  Resp: 20    General: Obese, alert, debilitated appearing 81 year old in no acute distress. She is sleeping but easily arousable  Cardiovascular: S1, S2, with a soft systolic murmur.  Respiratory: Upper airway congestion; breathing nonlabored.  Abdomen: Cholecystostomy tube noted at the right upper quadrant draining serous bilious fluid. Positive bowel sounds, obese, nontender, nondistended.  Musculoskeletal/extremities: Mild flexion contracture of left upper extremity; no acute hot red joints; no pedal edema.   Neurologic: She is sleeping but arousable to name.  She has severe dysarthria.   Discharge Instructions   Discharge Instructions    Diet - low sodium heart healthy    Complete by:  As directed      Discharge instructions    Complete by:  As directed      Discharge instructions    Complete by:  As directed   PATIENT'S DIET SHOULD BE PURED WITH NECTAR THICKENED LIQUIDS.     Increase activity slowly    Complete by:  As directed      Increase activity slowly    Complete by:  As directed           Current Discharge Medication List    START taking these medications   Details  albuterol (PROVENTIL) (2.5 MG/3ML) 0.083% nebulizer solution Take 3 mLs (2.5 mg total) by nebulization every 2 (two) hours as needed for wheezing or shortness of breath.    aspirin (ASPIRIN CHILDRENS) 81 MG chewable tablet Chew 1 tablet (81 mg total) by mouth daily.    insulin aspart (NOVOLOG) 100 UNIT/ML injection Sliding scale before each meal and bedtime: Blood sugar 70-120 give 0 units. Blood sugar 121-150 give 2 units. Blood sugar 151-200 give 3 units. Blood sugar 200-250 give 5 units. Blood sugar 251-300 give 8 units. Blood sugar 301-350 give 11 units. Blood sugar 351-400 give 15 units. Blood sugar greater than 400 give 15 units and call doctor.    linezolid (ZYVOX) 600 MG/300ML IVPB Inject 300 mLs (600 mg total) into the vein every 12 (twelve) hours. Linezolid should be continued IV and discontinued after the last dose on Friday September 12, 2014.      CONTINUE these medications which have CHANGED   Details  donepezil (ARICEPT) 5 MG tablet Take 1 tablet (5 mg total) by mouth at bedtime.    losartan (COZAAR) 100 MG tablet Take 0.5 tablets (50 mg total) by mouth daily.  memantine (NAMENDA) 10 MG tablet Take 1 tablet (10 mg total) by mouth daily.      CONTINUE these medications which have NOT CHANGED   Details  amLODipine (NORVASC) 10 MG tablet Take 10 mg by mouth daily.    atenolol (TENORMIN) 50 MG tablet Take 50 mg by mouth daily.    carbamazepine  (TEGRETOL) 200 MG tablet Take 200 mg by mouth 3 (three) times daily.    escitalopram (LEXAPRO) 10 MG tablet Take 10 mg by mouth daily.    Fe Fum-FA-B Cmp-C-Zn-Mg-Mn-Cu (HEMATINIC PLUS COMPLEX) 106-1 MG TABS Take 1 tablet by mouth daily.    levothyroxine (SYNTHROID, LEVOTHROID) 50 MCG tablet Take 50 mcg by mouth daily before breakfast.    meclizine (ANTIVERT) 25 MG tablet Take 25 mg by mouth daily as needed for dizziness.    pantoprazole (PROTONIX) 20 MG tablet Take 20 mg by mouth daily.    rosuvastatin (CRESTOR) 20 MG tablet Take 20 mg by mouth every evening.      STOP taking these medications     aspirin EC 81 MG tablet      Calcium Carbonate-Vitamin D (CALCARB 600/D PO)      glimepiride (AMARYL) 4 MG tablet        Allergies  Allergen Reactions  . Codeine Other (See Comments)    Unknown reaction   Follow-up Information    Follow up with Jamesetta So, MD In 1 month.   Specialty:  General Surgery   Contact information:   [address] [place] [address] [phone]        The results of significant diagnostics from this hospitalization (including imaging, microbiology, ancillary and laboratory) are listed below for reference.    Significant Diagnostic Studies: Ct Abdomen Pelvis Wo Contrast  08/25/2014   CLINICAL DATA:  Fever and weakness  EXAM: CT ABDOMEN AND PELVIS WITHOUT CONTRAST  TECHNIQUE: Multidetector CT imaging of the abdomen and pelvis was performed following the standard protocol without IV contrast.  COMPARISON:  None.  FINDINGS: Lung bases are free of acute infiltrate or sizable effusion. A large hiatal hernia is noted.  The liver, spleen, adrenal glands and pancreas are within normal limits. The gallbladder is well distended demonstrates significant wall thickening with pericholecystic inflammatory changes and small dependent gallstones. These changes are suggestive of acute cholecystitis in the appropriate clinical setting. Nonobstructing renal  calculi are noted. A large right renal cyst is seen measuring 4.5 cm.  The colon demonstrates diffuse diverticular change. No diverticulitis or abscess is identified. The bladder is well distended. The uterus has been surgically removed. The bony structures are within normal limits. There are changes consistent with an IVC filter.  IMPRESSION: Changes suggestive of acute cholecystitis with gallstones. Ultrasound may be helpful for further evaluation. Correlation with physical exam is recommended as well.  Right renal cyst.  No other focal abnormality is seen.   Electronically Signed   By: Inez Catalina M.D.   On: 08/25/2014 12:54   Dg Chest 1 View  08/25/2014   CLINICAL DATA:  Weakness  EXAM: CHEST  1 VIEW  COMPARISON:  03/11/2014  FINDINGS: Cardiac shadow is mildly enlarged but stable. Diffuse interstitial changes are again identified and stable from the prior exam. Multiple old left rib fractures are again seen. The previously noted hiatal hernia is less well appreciated on the current exam.  IMPRESSION: No acute abnormality noted.   Electronically Signed   By: Inez Catalina M.D.   On: 08/25/2014 11:41   Ct Head  Wo Contrast  09/05/2014   CLINICAL DATA:  Dysarthria  EXAM: CT HEAD WITHOUT CONTRAST  TECHNIQUE: Contiguous axial images were obtained from the base of the skull through the vertex without intravenous contrast.  COMPARISON:  08/25/2014  FINDINGS: Bony calvarium again demonstrates postoperative change. Diffuse atrophic changes are noted. No findings to suggest acute hemorrhage, acute infarction or space-occupying mass lesion are. Changes of prior infarct in the right middle cerebral artery distribution is noted. Scattered white matter ischemic changes noted as well.  IMPRESSION: No change from the prior exam. Chronic atrophic and ischemic changes are again seen.   Electronically Signed   By: Inez Catalina M.D.   On: 09/05/2014 20:11   Ct Head Wo Contrast  08/25/2014   CLINICAL DATA:  Recent change in  mental status with weakness  EXAM: CT HEAD WITHOUT CONTRAST  TECHNIQUE: Contiguous axial images were obtained from the base of the skull through the vertex without intravenous contrast.  COMPARISON:  03/11/2014  FINDINGS: Bony calvarium again demonstrates postoperative changes on the right. Diffuse atrophic changes are noted. Some areas of encephalomalacia related to the previous surgery are noted. Chronic white matter ischemic changes noted as well. No findings to suggest acute hemorrhage, acute infarction or space-occupying mass lesion are noted. An old lacunar infarct is noted within the basal ganglia on the left.  IMPRESSION: Chronic atrophic and ischemic changes.  No acute abnormality noted.   Electronically Signed   By: Inez Catalina M.D.   On: 08/25/2014 11:34   US Abdomen Complete  08/25/2014   CLINICAL DATA:  81 year old female with a history of fever and weakness. CT of the same date demonstrates changes of potential acute cholecystitis.  EXAM: ULTRASOUND ABDOMEN COMPLETE  COMPARISON:  CT 08/25/2014  FINDINGS: Gallbladder: Gallbladder distended with thickened wall measuring greater than 5 mm. Echogenic material at the fundus of the gallbladder layer dependently. No pericholecystic fluid. Sonographic Percell Miller sign is reported negative.  Common bile duct: Diameter: 3.1 mm  Liver: No focal lesion identified. Within normal limits in parenchymal echogenicity.  IVC: No abnormality visualized.  Pancreas: Visualized portion unremarkable.  Spleen: Size and appearance within normal limits.  Right Kidney: Length: 10.2 cm. Echogenicity of the right kidney similar to that of the adjacent liver. No hydronephrosis. Anechoic cystic structure measuring 5.2 cm x 4.4 cm x 5.0 cm, compatible with Bosniak 1 cyst. Nonobstructive stone which is present on the comparison CT is not visualized  Left Kidney: Length: 8.0 cm. Echogenicity of the left kidney similar to that of the adjacent spleen. No evidence of hydronephrosis.   Abdominal aorta: No aneurysm visualized.  Other findings: None.  IMPRESSION: Sonographic findings of the gallbladder are suspicious for acute cholecystitis, however are equivocal, as the sonographic Percell Miller sign is reportedly negative. There is suspicious gallbladder wall thickening and echogenic debris/stones within the gallbladder. If there is need to evaluate for ductal obstruction, nuclear medicine HIDA study would be recommended.  Signed,  Dulcy Fanny. Earleen Newport, DO  [facility]  [facility]   Electronically Signed   By: Corrie Mckusick D.O.   On: 08/25/2014 15:33   Nm Hepato W/eject Fract  08/28/2014   CLINICAL DATA:  RIGHT upper quadrant abdominal pain, abnormal gallbladder by ultrasound showing gallstones, distension and thickened wall  EXAM: NUCLEAR MEDICINE HEPATOBILIARY IMAGING  TECHNIQUE: Sequential images of the abdomen were obtained out to 60 minutes following intravenous administration of radiopharmaceutical.  RADIOPHARMACEUTICALS:  5.4 mCi Tc-65m Choletec IV  Pharmaceutical:  2.6 mg morphine  sulfate IV  COMPARISON:  Ultrasound abdomen 08/25/2014  FINDINGS: Normal tracer extraction by bloodstream.  Prolonged hepatocellular retention of tracer indicating a degree of hepatocellular dysfunction.  Excretion of tracer into the biliary tree is identified with bowel visualized by 18 minutes.  At 36 minutes gallbladder had not visualized.  Patient then received morphine IV and imaging was continued for an additional 30 minutes.  Gallbladder failed to visualized following morphine augmentation.  IMPRESSION: Patent common hepatic duct and common bile duct with passage of tracer into small bowel.  Hepatocellular dysfunction evidenced by prolonged hepatic retention of tracer.  Nonvisualization of the gallbladder despite morphine augmentation consistent with acute cholecystitis; please note that false-positive exams can occur however in patients with prolonged fasting.    Electronically Signed   By: Lavonia Dana M.D.   On: 08/28/2014 16:51   Ir Perc Cholecystostomy  08/29/2014   CLINICAL DATA:  81 year old with sepsis and acute cholecystitis.  EXAM: ULTRASOUND AND FLUOROSCOPIC GUIDED PERCUTANEOUS CHOLECYSTOSTOMY TUBE PLACEMENT  Physician: Stephan Minister. Henn, MD  FLUOROSCOPY TIME:  54 seconds, 16 mGy  MEDICATIONS: 0.5 mg Versed.  25 mcg fentanyl  ANESTHESIA/SEDATION: 11 minutes  PROCEDURE: Informed consent was obtained from the patient's daughter. Patient was placed supine on the interventional table. Patient's head was slightly elevated in order to improve breathing. Ultrasound was performed of the right upper quadrant. The right upper abdomen was prepped and draped in sterile fashion. Maximal barrier sterile technique was utilized including caps, mask, sterile gowns, sterile gloves, sterile drape, hand hygiene and skin antiseptic. Skin was anesthetized with 1% lidocaine. A 22 gauge Chiba needle was directed into the gallbladder through the right hepatic lobe with ultrasound guidance. Needle position was confirmed within the gallbladder. Yellow-green purulent fluid was aspirated. A 0.018 wire was placed. Accustick dilator set was placed. The tract was dilated to accommodate a 10.2 [place] multipurpose drain. Approximately 30 mL of thick yellow purulent fluid was aspirated. Catheter was sutured to the skin. Ultrasound of the right upper quadrant demonstrated decompression of the gallbladder. Catheter was sutured to the skin and attached to gravity bag. Fluoroscopic and ultrasound images were taken and saved for documentation. Fluid was sent for culture.  FINDINGS: There was a thickening gallbladder with echogenic stones or sludge. Needle position confirmed within the gallbladder. Approximately 30 mL of purulent fluid was removed from the gallbladder. The gallbladder was decompressed at the end of the procedure.  Estimated blood loss: Minimal  COMPLICATIONS: None  IMPRESSION: Successful  ultrasound and fluoroscopic guided cholecystostomy tube placement.   Electronically Signed   By: Markus Daft M.D.   On: 08/29/2014 18:31   Dg Chest Port 1 View  09/04/2014   CLINICAL DATA:  Acute heart failure. Renal failure, sepsis, urinary tract infection and cholecystitis. History of diabetes, hypertension, stroke and brain tumor.  EXAM: PORTABLE CHEST - 1 VIEW  COMPARISON:  08/28/2014 and 08/25/2014.  FINDINGS: 1425 hours. Patient is rotated to the right. Right arm PICC appears unchanged at the lower SVC level. Allowing for the rotation, the heart size and mediastinal contours are stable. There are persistent low lung volumes with bibasilar atelectasis and possible mild edema. No confluent airspace opacity or significant pleural effusion identified.  IMPRESSION: No significant change in bibasilar atelectasis and possible mild edema.   Electronically Signed   By: Richardean Sale M.D.   On: 09/04/2014 14:51   Dg Chest Port 1 View  08/28/2014   CLINICAL DATA:  Shortness of breath, urinary tract infection and sepsis, previous  CVA, chronic bronchitis.  EXAM: PORTABLE CHEST - 1 VIEW  COMPARISON:  Portable chest x-ray of Aug 25, 2014  FINDINGS: The lungs are mildly hypoinflated. The pulmonary interstitial markings are more conspicuous today especially at the right lung base. The cardiac silhouette remains enlarged. The central pulmonary vascularity is engorged. The bony thorax exhibits no acute abnormality.  IMPRESSION: Mild hypo inflation which accentuates the lung markings. However, low-grade CHF is suspected. Interval development of subsegmental atelectasis at the right lung base is suspected. If the patient can tolerate the procedure, a PA and lateral chest x-ray would be useful.   Electronically Signed   By: David  Martinique M.D.   On: 08/28/2014 12:03    Microbiology: Recent Results (from the past 240 hour(s))  Culture, blood (routine x 2)     Status: None (Preliminary result)   Collection Time: 09/08/14   9:05 AM  Result Value Ref Range Status   Specimen Description BLOOD  Final   Special Requests Normal  Final   Culture NO GROWTH 1 DAY  Final   Report Status PENDING  Incomplete  Culture, blood (routine x 2)     Status: None (Preliminary result)   Collection Time: 09/08/14  9:25 AM  Result Value Ref Range Status   Specimen Description BLOOD  Final   Special Requests BLOOD  Final   Culture NO GROWTH 1 DAY  Final   Report Status PENDING  Incomplete     Labs: Basic Metabolic Panel:  Recent Labs Lab 09/03/14 0613 09/05/14 0612 09/08/14 0611 09/09/14 0651  NA 135 140 142 142  K 3.7 3.5 5.2* 4.2  CL 105 101 105 101  CO2 28 30 27 31   GLUCOSE 165* 156* 392* 123*  BUN 7 <5* 5* 8  CREATININE 1.30* 1.20* 1.18* 1.31*  CALCIUM 8.0* 7.8* 7.6* 7.9*   Liver Function Tests:  Recent Labs Lab 09/08/14 0611  AST 20  ALT 14  ALKPHOS 69  BILITOT 0.3  PROT 5.5*  ALBUMIN 2.4*   No results for input(s): LIPASE, AMYLASE in the last 168 hours. No results for input(s): AMMONIA in the last 168 hours. CBC:  Recent Labs Lab 09/03/14 0613 09/05/14 0612 09/08/14 0611  WBC 12.0* 10.0 9.2  HGB 8.6* 8.5* 8.1*  HCT 25.7* 26.2* 25.7*  MCV 95.2 96.7 97.7  PLT 284 323 316   Cardiac Enzymes: No results for input(s): CKTOTAL, CKMB, CKMBINDEX, TROPONINI in the last 168 hours. BNP: BNP (last 3 results) No results for input(s): BNP in the last 8760 hours.  ProBNP (last 3 results) No results for input(s): PROBNP in the last 8760 hours.  CBG:  Recent Labs Lab 09/08/14 1126 09/08/14 1653 09/08/14 2127 09/09/14 0730 09/09/14 1152  GLUCAP 189* 104* 168* 123* 138*       Signed:  Kayelyn Lemon  [facility] 09/09/2014, 2:30 PM

## 2014-09-09 NOTE — Progress Notes (Signed)
Report called [facility].

## 2014-09-09 NOTE — Care Management Note (Addendum)
Case Management Note  Patient Details  Name: Tina Hunter MRN: [id number] Date of Birth: 07-Oct-1924   Expected Discharge Date:                  Expected Discharge Plan:  [place]  In-House Referral:  Clinical Social Work  Discharge planning Services  CM Consult  Post Acute Care Choice:  Durable Medical Equipment, Home Health Choice offered to:  Patient  DME Arranged:    DME Agency:  [facility]  HH Arranged:    [facility] Agency:  [facility]  Status of Service:  Completed, signed off  Medicare Important Message Given:  Yes Date Medicare IM Given:  09/05/14 Medicare IM give by:  Christinia Gully, RN BSN CM Date Additional Medicare IM Given:  09/09/14 Additional Medicare Important Message give by:  Jolene Provost, RN, MSN, CM   If discussed at Long Length of Stay Meetings, dates discussed:  09/09/2014  Additional Comments: Pt is discharging today to SNF. CSW has arranged for placement at [facility], pt's daughter and RN are aware. No CM needs at the time of discharge.   Sherald Barge, RN 09/09/2014, 1:24 PM

## 2014-09-09 NOTE — Progress Notes (Addendum)
Nutrition Follow-up   Pt meets criteria for severe malnturition in the context of acute illness due to energy intake </= 50% for >/= 5 days and wt loss >2% in 1 week.    INTERVENTION:  Magic cup with dinner (if available at [facility])  Provided education and coupons   Recommend pt be discharged with Ensure Enlive po BID, each supplement provides 350 kcal and 20 grams of protein   NUTRITION DIAGNOSIS:  Inadequate oral intake related to decreased appetite, sepsis, cholecystitis with VRE bacteremia and  as evidenced by pt medical record, severe wt loss, meal intake records 0-50% (15d).  GOAL:  Patient will meet greater than or equal to 90% of their needs  MONITOR:  PO intake, Supplement acceptance, Weight trends  REASON FOR ASSESSMENT:   poor po's   ASSESSMENT: Pt current weight shows severe decrease of 9%, 0-20% of most meals per intake records.  Pt is going to [facility]. Family education provided to help prevent further weight loss and nutritional status decline. High protein High calorie diet handouts with recipes and Boost and Ensure coupons given to daughters.  Height:  Ht Readings from Last 1 Encounters:  08/25/14 5\' 4"  (1.626 m)    Weight:  Wt Readings from Last 1 Encounters:  09/09/14 130 lb 12.8 oz (59.33 kg)    Ideal Body Weight:  55 kg  Wt Readings from Last 10 Encounters:  09/09/14 130 lb 12.8 oz (59.33 kg)  03/12/14 143 lb 4.8 oz (65 kg)    BMI:  Body mass index is 22.44 kg/(m^2).  Estimated Nutritional Needs:  Kcal:  1600-1800  Protein:  78 gr  Fluid:  1.6-1.8 liters daily  Skin:   WDL  Diet Order:  DIET - DYS 1 Room service appropriate?: Yes; Fluid consistency:: Nectar Thick  EDUCATION NEEDS: none at this time   Intake/Output Summary (Last 24 hours) at 09/09/14 1326 Last data filed at 09/08/14 2125  Gross per 24 hour  Intake    740 ml  Output     50 ml  Net    690 ml    Last BM:  09/08/14  Colman Cater MS,RD,CSG,LDN Office:  [phone] Pager: [phone]

## 2014-09-09 NOTE — Discharge Instructions (Signed)
[place] Hospital Stay Proper nutrition can help your body recover from illness and injury.   Foods and beverages high in protein, vitamins, and minerals help rebuild muscle loss, promote healing, & reduce fall risk.   In addition to eating healthy foods, a nutrition shake is an easy, delicious way to get the nutrition you need during and after your hospital stay  It is recommended that you continue to drink 2 bottles per day of:     Ensure Enlive (Nectar-thick) for at least 1 month (30 days) after your hospital stay   Tips for adding a nutrition shake into your routine: As allowed, drink one with vitamins or medications instead of water or juice Enjoy one as a tasty mid-morning or afternoon snack Drink cold or make a milkshake out of it Drink one instead of milk with cereal or snacks Use as a coffee creamer   Available at the following grocery stores and pharmacies:           * [place] [phone]            For COUPONS visit: www.ensure.com/join or http://dawson-may.com/   Suggested Substitutions Ensure Plus = Boost Plus = Carnation Breakfast Essentials = Boost Compact Ensure Active Clear = Boost Breeze Glucerna Shake = Boost Glucose Control = Carnation Breakfast Essentials SUGAR FREE

## 2014-09-13 LAB — CULTURE, BLOOD (ROUTINE X 2)
Culture: NO GROWTH
Culture: NO GROWTH

## 2014-09-14 ENCOUNTER — Encounter (HOSPITAL_COMMUNITY): Payer: Self-pay | Admitting: Internal Medicine

## 2014-09-14 ENCOUNTER — Inpatient Hospital Stay (HOSPITAL_COMMUNITY)
Admission: AD | Admit: 2014-09-14 | Discharge: 2014-09-18 | DRG: 194 | Disposition: A | Discharge status: Skilled Nursing Facility | Payer: Medicare Other | Source: Ambulatory Visit | Attending: Internal Medicine | Admitting: Internal Medicine

## 2014-09-14 DIAGNOSIS — R509 Fever, unspecified: Secondary | ICD-10-CM | POA: Diagnosis present

## 2014-09-14 DIAGNOSIS — E780 Pure hypercholesterolemia: Secondary | ICD-10-CM | POA: Diagnosis present

## 2014-09-14 DIAGNOSIS — R000 Tachycardia, unspecified: Secondary | ICD-10-CM | POA: Diagnosis present

## 2014-09-14 DIAGNOSIS — I69354 Hemiplegia and hemiparesis following cerebral infarction affecting left non-dominant side: Secondary | ICD-10-CM

## 2014-09-14 DIAGNOSIS — J42 Unspecified chronic bronchitis: Secondary | ICD-10-CM | POA: Diagnosis present

## 2014-09-14 DIAGNOSIS — E118 Type 2 diabetes mellitus with unspecified complications: Secondary | ICD-10-CM | POA: Diagnosis present

## 2014-09-14 DIAGNOSIS — I248 Other forms of acute ischemic heart disease: Secondary | ICD-10-CM | POA: Diagnosis present

## 2014-09-14 DIAGNOSIS — G40909 Epilepsy, unspecified, not intractable, without status epilepticus: Secondary | ICD-10-CM | POA: Diagnosis present

## 2014-09-14 DIAGNOSIS — Z794 Long term (current) use of insulin: Secondary | ICD-10-CM

## 2014-09-14 DIAGNOSIS — Z79899 Other long term (current) drug therapy: Secondary | ICD-10-CM | POA: Diagnosis not present

## 2014-09-14 DIAGNOSIS — R112 Nausea with vomiting, unspecified: Secondary | ICD-10-CM | POA: Diagnosis present

## 2014-09-14 DIAGNOSIS — J189 Pneumonia, unspecified organism: Secondary | ICD-10-CM | POA: Diagnosis present

## 2014-09-14 DIAGNOSIS — R7989 Other specified abnormal findings of blood chemistry: Secondary | ICD-10-CM | POA: Diagnosis not present

## 2014-09-14 DIAGNOSIS — Z79891 Long term (current) use of opiate analgesic: Secondary | ICD-10-CM

## 2014-09-14 DIAGNOSIS — I472 Ventricular tachycardia: Secondary | ICD-10-CM | POA: Diagnosis not present

## 2014-09-14 DIAGNOSIS — F0390 Unspecified dementia without behavioral disturbance: Secondary | ICD-10-CM | POA: Diagnosis present

## 2014-09-14 DIAGNOSIS — D638 Anemia in other chronic diseases classified elsewhere: Secondary | ICD-10-CM | POA: Diagnosis present

## 2014-09-14 DIAGNOSIS — E119 Type 2 diabetes mellitus without complications: Secondary | ICD-10-CM | POA: Diagnosis present

## 2014-09-14 DIAGNOSIS — E039 Hypothyroidism, unspecified: Secondary | ICD-10-CM | POA: Diagnosis present

## 2014-09-14 DIAGNOSIS — Z885 Allergy status to narcotic agent status: Secondary | ICD-10-CM | POA: Diagnosis not present

## 2014-09-14 DIAGNOSIS — N183 Chronic kidney disease, stage 3 (moderate): Secondary | ICD-10-CM | POA: Diagnosis present

## 2014-09-14 DIAGNOSIS — E876 Hypokalemia: Secondary | ICD-10-CM | POA: Diagnosis present

## 2014-09-14 DIAGNOSIS — Z7982 Long term (current) use of aspirin: Secondary | ICD-10-CM | POA: Diagnosis not present

## 2014-09-14 DIAGNOSIS — I129 Hypertensive chronic kidney disease with stage 1 through stage 4 chronic kidney disease, or unspecified chronic kidney disease: Secondary | ICD-10-CM | POA: Diagnosis present

## 2014-09-14 DIAGNOSIS — R778 Other specified abnormalities of plasma proteins: Secondary | ICD-10-CM | POA: Diagnosis present

## 2014-09-14 LAB — CBC WITH DIFFERENTIAL/PLATELET
Basophils Absolute: 0 10*3/uL (ref 0.0–0.1)
Basophils Relative: 0 % (ref 0–1)
Eosinophils Absolute: 0.3 10*3/uL (ref 0.0–0.7)
Eosinophils Relative: 1 % (ref 0–5)
HCT: 26.6 % — ABNORMAL LOW (ref 36.0–46.0)
HEMOGLOBIN: 9 g/dL — AB (ref 12.0–15.0)
LYMPHS ABS: 2.1 10*3/uL (ref 0.7–4.0)
LYMPHS PCT: 8 % — AB (ref 12–46)
MCH: 30.8 pg (ref 26.0–34.0)
MCHC: 33.8 g/dL (ref 30.0–36.0)
MCV: 91.1 fL (ref 78.0–100.0)
MONOS PCT: 8 % (ref 3–12)
Monocytes Absolute: 2.1 10*3/uL — ABNORMAL HIGH (ref 0.1–1.0)
NEUTROS PCT: 83 % — AB (ref 43–77)
Neutro Abs: 21.8 10*3/uL — ABNORMAL HIGH (ref 1.7–7.7)
Platelets: 169 10*3/uL (ref 150–400)
RBC: 2.92 MIL/uL — AB (ref 3.87–5.11)
RDW: 13.7 % (ref 11.5–15.5)
WBC: 26.3 10*3/uL — AB (ref 4.0–10.5)

## 2014-09-14 LAB — COMPREHENSIVE METABOLIC PANEL
ALBUMIN: 2.6 g/dL — AB (ref 3.5–5.0)
ALT: 16 U/L (ref 14–54)
AST: 26 U/L (ref 15–41)
Alkaline Phosphatase: 70 U/L (ref 38–126)
Anion gap: 13 (ref 5–15)
BILIRUBIN TOTAL: UNDETERMINED mg/dL (ref 0.3–1.2)
BUN: 7 mg/dL (ref 6–20)
CO2: 28 mmol/L (ref 22–32)
Calcium: 8.2 mg/dL — ABNORMAL LOW (ref 8.9–10.3)
Chloride: 92 mmol/L — ABNORMAL LOW (ref 101–111)
Creatinine, Ser: 1.37 mg/dL — ABNORMAL HIGH (ref 0.44–1.00)
GFR, EST AFRICAN AMERICAN: 39 mL/min — AB (ref >60)
GFR, EST NON AFRICAN AMERICAN: 34 mL/min — AB (ref >60)
GLUCOSE: 191 mg/dL — AB (ref 65–99)
POTASSIUM: 4.2 mmol/L (ref 3.5–5.1)
Sodium: 133 mmol/L — ABNORMAL LOW (ref 135–145)
TOTAL PROTEIN: 6.6 g/dL (ref 6.5–8.1)

## 2014-09-14 LAB — LACTIC ACID, PLASMA: LACTIC ACID, VENOUS: 2.7 mmol/L — AB (ref 0.5–2.0)

## 2014-09-14 LAB — PROTIME-INR
INR: 1.21 (ref 0.00–1.49)
Prothrombin Time: 15.4 seconds — ABNORMAL HIGH (ref 11.6–15.2)

## 2014-09-14 LAB — CARBAMAZEPINE LEVEL, TOTAL: Carbamazepine Lvl: 3.1 ug/mL — ABNORMAL LOW (ref 4.0–12.0)

## 2014-09-14 LAB — TROPONIN I: Troponin I: 0.19 ng/mL — ABNORMAL HIGH (ref <0.031)

## 2014-09-14 LAB — D-DIMER, QUANTITATIVE (NOT AT ARMC): D-Dimer, Quant: 3.44 ug/mL-FEU — ABNORMAL HIGH (ref 0.00–0.48)

## 2014-09-14 LAB — GLUCOSE, CAPILLARY
Glucose-Capillary: 213 mg/dL — ABNORMAL HIGH (ref 65–99)
Glucose-Capillary: 179 mg/dL — ABNORMAL HIGH (ref 65–99)

## 2014-09-14 LAB — TSH: TSH: 2.862 u[IU]/mL (ref 0.350–4.500)

## 2014-09-14 LAB — MRSA PCR SCREENING: MRSA by PCR: NEGATIVE

## 2014-09-14 MED ORDER — SODIUM CHLORIDE 0.9 % IV SOLN
INTRAVENOUS | Status: AC
  Administered 2014-09-14: 21:00:00 via INTRAVENOUS

## 2014-09-14 MED ORDER — PIPERACILLIN-TAZOBACTAM 3.375 G IVPB: 3.3750 g | Freq: Four times a day (QID) | INTRAVENOUS | Status: DC

## 2014-09-14 MED ORDER — ENOXAPARIN SODIUM 30 MG/0.3ML ~~LOC~~ SOLN
30.0000 mg | SUBCUTANEOUS | Status: DC
  Administered 2014-09-14 – 2014-09-17 (×4): 30 mg via SUBCUTANEOUS
  Filled 2014-09-14 (×5): qty 0.3

## 2014-09-14 MED ORDER — PANTOPRAZOLE SODIUM 40 MG PO TBEC
40.0000 mg | DELAYED_RELEASE_TABLET | Freq: Two times a day (BID) | ORAL | Status: DC
  Administered 2014-09-14 – 2014-09-18 (×8): 40 mg via ORAL
  Filled 2014-09-14 (×8): qty 1

## 2014-09-14 MED ORDER — ATENOLOL 50 MG PO TABS
50.0000 mg | ORAL_TABLET | Freq: Every day | ORAL | Status: DC
  Administered 2014-09-15 – 2014-09-18 (×4): 50 mg via ORAL
  Filled 2014-09-14 (×4): qty 1

## 2014-09-14 MED ORDER — LOSARTAN POTASSIUM 50 MG PO TABS
50.0000 mg | ORAL_TABLET | Freq: Every day | ORAL | Status: DC
  Filled 2014-09-14: qty 1

## 2014-09-14 MED ORDER — ROSUVASTATIN CALCIUM 20 MG PO TABS
20.0000 mg | ORAL_TABLET | Freq: Every evening | ORAL | Status: DC
  Administered 2014-09-14 – 2014-09-18 (×5): 20 mg via ORAL
  Filled 2014-09-14 (×5): qty 1

## 2014-09-14 MED ORDER — INSULIN ASPART 100 UNIT/ML ~~LOC~~ SOLN
0.0000 [IU] | Freq: Three times a day (TID) | SUBCUTANEOUS | Status: DC
  Administered 2014-09-15: 1 [IU] via SUBCUTANEOUS
  Administered 2014-09-16 – 2014-09-17 (×2): 2 [IU] via SUBCUTANEOUS
  Administered 2014-09-17: 1 [IU] via SUBCUTANEOUS
  Administered 2014-09-17: 2 [IU] via SUBCUTANEOUS
  Administered 2014-09-18: 1 [IU] via SUBCUTANEOUS
  Administered 2014-09-18: 2 [IU] via SUBCUTANEOUS

## 2014-09-14 MED ORDER — ACETAMINOPHEN 650 MG RE SUPP: 650.0000 mg | Freq: Four times a day (QID) | RECTAL | Status: DC | PRN

## 2014-09-14 MED ORDER — CARBAMAZEPINE 200 MG PO TABS
200.0000 mg | ORAL_TABLET | Freq: Three times a day (TID) | ORAL | Status: DC
  Administered 2014-09-14 – 2014-09-18 (×12): 200 mg via ORAL
  Filled 2014-09-14 (×13): qty 1

## 2014-09-14 MED ORDER — INSULIN ASPART 100 UNIT/ML ~~LOC~~ SOLN
0.0000 [IU] | Freq: Every day | SUBCUTANEOUS | Status: DC
  Administered 2014-09-16: 0 [IU] via SUBCUTANEOUS

## 2014-09-14 MED ORDER — ESCITALOPRAM OXALATE 10 MG PO TABS
10.0000 mg | ORAL_TABLET | Freq: Every day | ORAL | Status: DC
Start: 2014-09-15 — End: 2014-09-18
  Administered 2014-09-15 – 2014-09-18 (×4): 10 mg via ORAL
  Filled 2014-09-14 (×4): qty 1

## 2014-09-14 MED ORDER — LOSARTAN POTASSIUM 50 MG PO TABS: 50.0000 mg | ORAL_TABLET | Freq: Every day | ORAL | Status: DC

## 2014-09-14 MED ORDER — LEVOTHYROXINE SODIUM 50 MCG PO TABS
50.0000 ug | ORAL_TABLET | Freq: Every day | ORAL | Status: DC
  Administered 2014-09-15 – 2014-09-18 (×4): 50 ug via ORAL
  Filled 2014-09-14 (×6): qty 1

## 2014-09-14 MED ORDER — MEMANTINE HCL 10 MG PO TABS
10.0000 mg | ORAL_TABLET | Freq: Every day | ORAL | Status: DC
  Administered 2014-09-15 – 2014-09-18 (×4): 10 mg via ORAL
  Filled 2014-09-14 (×4): qty 1

## 2014-09-14 MED ORDER — ACETAMINOPHEN 325 MG PO TABS
650.0000 mg | ORAL_TABLET | Freq: Four times a day (QID) | ORAL | Status: DC | PRN
  Administered 2014-09-16: 650 mg via ORAL
  Filled 2014-09-14: qty 2

## 2014-09-14 MED ORDER — SODIUM CHLORIDE 0.9 % IJ SOLN
3.0000 mL | Freq: Two times a day (BID) | INTRAMUSCULAR | Status: DC
  Administered 2014-09-14: 3 mL via INTRAVENOUS
  Administered 2014-09-15: 10 mL via INTRAVENOUS
  Administered 2014-09-15 – 2014-09-18 (×4): 3 mL via INTRAVENOUS

## 2014-09-14 MED ORDER — ALBUTEROL SULFATE (2.5 MG/3ML) 0.083% IN NEBU: 2.5000 mg | INHALATION_SOLUTION | RESPIRATORY_TRACT | Status: DC | PRN

## 2014-09-14 MED ORDER — ASPIRIN 81 MG PO CHEW
81.0000 mg | CHEWABLE_TABLET | Freq: Every day | ORAL | Status: DC
  Administered 2014-09-15 – 2014-09-18 (×4): 81 mg via ORAL
  Filled 2014-09-14 (×4): qty 1

## 2014-09-14 MED ORDER — PIPERACILLIN-TAZOBACTAM 3.375 G IVPB
3.3750 g | Freq: Three times a day (TID) | INTRAVENOUS | Status: DC
  Administered 2014-09-14 – 2014-09-18 (×12): 3.375 g via INTRAVENOUS
  Filled 2014-09-14 (×14): qty 50

## 2014-09-14 MED ORDER — AMLODIPINE BESYLATE 10 MG PO TABS
10.0000 mg | ORAL_TABLET | Freq: Every day | ORAL | Status: DC
  Administered 2014-09-15 – 2014-09-18 (×4): 10 mg via ORAL
  Filled 2014-09-14 (×4): qty 1

## 2014-09-14 NOTE — H&P (Addendum)
Tina Hunter is an 81 y.o. female.    Pcp: Dr Franchot Heidelberg (pcp, [place], [place]) Dr. Arnoldo Morale (surgeon)  Chief Complaint: fever HPI: 81 yo female with dm2, htn, hyperlipidemia,CVA with hemiparesis,  hx of acute cholecystitis, s/p percutaneous cholecystostomy, apparently developed last nite, pt is resident of SNF,  CXR at SNF was negative, ua pending.  Pt apparently has not had any output from her percutaneous cholecystostomy today.  Pt sent to hospital and admitted for evaluation of fever as well as lack of output from cholecystostmy.    Past Medical History  Diagnosis Date  . Stroke   . Diabetes mellitus without complication   . Hypertension   . High cholesterol   . Brain tumor (benign)   . Chronic bronchitis   . Seizures   . Bacteremia due to vancomycin resistant Enterococcus 08/25/2014  . Sepsis secondary to UTI 08/25/2014    and acute cholecystitis/VRE bacteremia.  . Cholecystitis, acute 08/25/2014    Past Surgical History  Procedure Laterality Date  . Brain surgery    . Abdominal hysterectomy    . Ct perc cholecystostomy      Family History  Problem Relation Age of Onset  . Diabetes Sister    Social History:  reports that she has never smoked. She does not have any smokeless tobacco history on file. She reports that she does not drink alcohol or use illicit drugs.  Allergies:  Allergies  Allergen Reactions  . Codeine Other (See Comments)    Unknown reaction    Medications Prior to Admission  Medication Sig Dispense Refill  . albuterol (PROVENTIL) (2.5 MG/3ML) 0.083% nebulizer solution Take 3 mLs (2.5 mg total) by nebulization every 2 (two) hours as needed for wheezing or shortness of breath.    Marland Kitchen amLODipine (NORVASC) 10 MG tablet Take 10 mg by mouth daily.    Marland Kitchen aspirin (ASPIRIN CHILDRENS) 81 MG chewable tablet Chew 1 tablet (81 mg total) by mouth daily.    Marland Kitchen atenolol (TENORMIN) 50 MG tablet Take 50 mg by mouth daily.    . carbamazepine (TEGRETOL) 200 MG tablet Take 200 mg  by mouth 3 (three) times daily.    Marland Kitchen donepezil (ARICEPT) 10 MG tablet Take 10 mg by mouth daily.    Marland Kitchen donepezil (ARICEPT) 5 MG tablet Take 1 tablet (5 mg total) by mouth at bedtime.    Marland Kitchen escitalopram (LEXAPRO) 10 MG tablet Take 10 mg by mouth daily.    . Fe Fum-FA-B Cmp-C-Zn-Mg-Mn-Cu (HEMATINIC PLUS COMPLEX) 106-1 MG TABS Take 1 tablet by mouth daily.    Marland Kitchen glimepiride (AMARYL) 4 MG tablet Take 4 mg by mouth daily.    Marland Kitchen HYDROcodone-acetaminophen (NORCO/VICODIN) 5-325 MG per tablet     . insulin aspart (NOVOLOG) 100 UNIT/ML injection Sliding scale before each meal and bedtime: Blood sugar 70-120 give 0 units. Blood sugar 121-150 give 2 units. Blood sugar 151-200 give 3 units. Blood sugar 200-250 give 5 units. Blood sugar 251-300 give 8 units. Blood sugar 301-350 give 11 units. Blood sugar 351-400 give 15 units. Blood sugar greater than 400 give 15 units and call doctor.    . levothyroxine (SYNTHROID, LEVOTHROID) 50 MCG tablet Take 50 mcg by mouth daily before breakfast.    . linezolid (ZYVOX) 600 MG/300ML IVPB Inject 300 mLs (600 mg total) into the vein every 12 (twelve) hours. Linezolid should be continued IV and discontinued after the last dose on Friday September 12, 2014.    Marland Kitchen losartan (COZAAR) 100 MG tablet Take 0.5 tablets (  50 mg total) by mouth daily.    . meclizine (ANTIVERT) 25 MG tablet Take 25 mg by mouth daily as needed for dizziness.    . memantine (NAMENDA) 10 MG tablet Take 1 tablet (10 mg total) by mouth daily.    . pantoprazole (PROTONIX) 20 MG tablet Take 20 mg by mouth daily.    . rosuvastatin (CRESTOR) 20 MG tablet Take 20 mg by mouth every evening.      Results for orders placed or performed during the hospital encounter of 09/14/14 (from the past 48 hour(s))  Glucose, capillary     Status: Abnormal   Collection Time: 09/14/14  6:06 PM  Result Value Ref Range   Glucose-Capillary 213 (H) 65 - 99 mg/dL   No results found.  Review of Systems  Constitutional: Positive for fever.  Negative for chills, weight loss, malaise/fatigue and diaphoresis.  HENT: Negative.   Eyes: Negative.   Respiratory: Negative for cough, hemoptysis, sputum production, shortness of breath and wheezing.   Cardiovascular: Negative.   Gastrointestinal: Positive for nausea and vomiting. Negative for heartburn, abdominal pain, diarrhea, constipation, blood in stool and melena.  Genitourinary: Negative.   Musculoskeletal: Negative.   Skin: Negative for itching and rash.  Neurological: Negative.  Negative for weakness.  Endo/Heme/Allergies: Negative.   Psychiatric/Behavioral: Negative.     Blood pressure 155/59, pulse 107, temperature 99.4 F (37.4 C), temperature source Oral, resp. rate 35, weight 60.5 kg (133 lb 6.1 oz), SpO2 99 %. Physical Exam  Constitutional: She is oriented to person, place, and time. She appears well-developed and well-nourished.  HENT:  Head: Normocephalic and atraumatic.  Eyes: Conjunctivae and EOM are normal. Pupils are equal, round, and reactive to light. No scleral icterus.  Neck: Normal range of motion. Neck supple. No JVD present. No tracheal deviation present. No thyromegaly present.  Cardiovascular: Normal rate and regular rhythm.  Exam reveals no gallop and no friction rub.   No murmur heard. Respiratory: Effort normal and breath sounds normal. No respiratory distress. She has no wheezes. She has no rales.  GI: Soft. Bowel sounds are normal. She exhibits no distension. There is no tenderness. There is no rebound and no guarding.  Musculoskeletal: Normal range of motion. She exhibits no edema or tenderness.  Lymphadenopathy:    She has no cervical adenopathy.  Neurological: She is alert and oriented to person, place, and time. She has normal reflexes. She displays normal reflexes. No cranial nerve deficit. She exhibits normal muscle tone. Coordination normal.  Weakness left side since cva  Skin: Skin is warm and dry. No rash noted. No erythema. No pallor.   Psychiatric: She has a normal mood and affect. Her behavior is normal. Judgment and thought content normal.     Assessment/Plan Fever Check cbc, cmp, ua  Check CT scan abd/ pellvis, chest Blood cx x2 sets Check echo D dimer Check CTA chest  Tachycardia 12 lead ekg Check tsh Check echo Trop i q6h x3  Cholecystitis s/p percutaneous cholecystostomy Will image with ultrasound as per radiology recommendation  N/v zofran 4mg  iv q6h prn  Dm2 fsbs ac and qhs, iss  Seizure do Cont current tx  CVA with L sided hemiparesis PT/OT consultation in am  DVT prophylaxis: scd  CODE STATUS: full code  Jani Gravel 09/14/2014, 6:23 PM

## 2014-09-14 NOTE — Progress Notes (Signed)
CRITICAL VALUE ALERT  Critical value received:  Lactic Acid 3.2  Date of notification:  09/14/2014  Time of notification:  2200  Critical value read back:Yes.    Nurse who received alert: Annice Pih, RN  MD notified (1st page):  Chaney Malling  Time of first page:  2203  MD notified (2nd page):  Time of second page:  Responding MD:    Time MD responded:

## 2014-09-14 NOTE — Progress Notes (Signed)
Pt arrived to room 2C13 via stretcher, escorted by EMS personnel.  Skin assessment completed with Debby Freiberg, RN.  No s/s of breakdown noted.   Pt arrived with NO personal possessions of any kind.  Dr. Maudie Mercury notified of pt's arrival by Levada Dy, Financial controller.  Nursing staff will continue to monitor.

## 2014-09-15 ENCOUNTER — Inpatient Hospital Stay (HOSPITAL_COMMUNITY): Payer: Medicare Other

## 2014-09-15 ENCOUNTER — Encounter (HOSPITAL_COMMUNITY): Payer: Self-pay | Admitting: General Surgery

## 2014-09-15 DIAGNOSIS — R7989 Other specified abnormal findings of blood chemistry: Secondary | ICD-10-CM

## 2014-09-15 LAB — CBC WITH DIFFERENTIAL/PLATELET
Basophils Absolute: 0 10*3/uL (ref 0.0–0.1)
Basophils Relative: 0 % (ref 0–1)
Eosinophils Absolute: 0.5 10*3/uL (ref 0.0–0.7)
Eosinophils Relative: 3 % (ref 0–5)
HCT: 21.7 % — ABNORMAL LOW (ref 36.0–46.0)
Hemoglobin: 7.2 g/dL — ABNORMAL LOW (ref 12.0–15.0)
LYMPHS PCT: 8 % — AB (ref 12–46)
Lymphs Abs: 1.5 10*3/uL (ref 0.7–4.0)
MCH: 30.6 pg (ref 26.0–34.0)
MCHC: 33.2 g/dL (ref 30.0–36.0)
MCV: 92.3 fL (ref 78.0–100.0)
Monocytes Absolute: 1.8 10*3/uL — ABNORMAL HIGH (ref 0.1–1.0)
Monocytes Relative: 10 % (ref 3–12)
NEUTROS ABS: 15.3 10*3/uL — AB (ref 1.7–7.7)
Neutrophils Relative %: 79 % — ABNORMAL HIGH (ref 43–77)
Platelets: 155 10*3/uL (ref 150–400)
RBC: 2.35 MIL/uL — ABNORMAL LOW (ref 3.87–5.11)
RDW: 13.9 % (ref 11.5–15.5)
WBC: 19.2 10*3/uL — ABNORMAL HIGH (ref 4.0–10.5)

## 2014-09-15 LAB — COMPREHENSIVE METABOLIC PANEL
ALK PHOS: 69 U/L (ref 38–126)
ALT: 14 U/L (ref 14–54)
AST: 22 U/L (ref 15–41)
Albumin: 2.5 g/dL — ABNORMAL LOW (ref 3.5–5.0)
Anion gap: 9 (ref 5–15)
BUN: 7 mg/dL (ref 6–20)
CHLORIDE: 91 mmol/L — AB (ref 101–111)
CO2: 32 mmol/L (ref 22–32)
CREATININE: 1.4 mg/dL — AB (ref 0.44–1.00)
Calcium: 8.1 mg/dL — ABNORMAL LOW (ref 8.9–10.3)
GFR calc Af Amer: 38 mL/min — ABNORMAL LOW (ref >60)
GFR calc non Af Amer: 33 mL/min — ABNORMAL LOW (ref >60)
GLUCOSE: 134 mg/dL — AB (ref 65–99)
POTASSIUM: 3.8 mmol/L (ref 3.5–5.1)
Sodium: 132 mmol/L — ABNORMAL LOW (ref 135–145)
TOTAL PROTEIN: 6.1 g/dL — AB (ref 6.5–8.1)
Total Bilirubin: 0.4 mg/dL (ref 0.3–1.2)

## 2014-09-15 LAB — CBC
HEMATOCRIT: 22.9 % — AB (ref 36.0–46.0)
Hemoglobin: 7.7 g/dL — ABNORMAL LOW (ref 12.0–15.0)
MCH: 30.8 pg (ref 26.0–34.0)
MCHC: 33.6 g/dL (ref 30.0–36.0)
MCV: 91.6 fL (ref 78.0–100.0)
Platelets: 152 10*3/uL (ref 150–400)
RBC: 2.5 MIL/uL — AB (ref 3.87–5.11)
RDW: 13.8 % (ref 11.5–15.5)
WBC: 18.9 10*3/uL — AB (ref 4.0–10.5)

## 2014-09-15 LAB — GLUCOSE, CAPILLARY
GLUCOSE-CAPILLARY: 145 mg/dL — AB (ref 65–99)
GLUCOSE-CAPILLARY: 132 mg/dL — AB (ref 65–99)
Glucose-Capillary: 157 mg/dL — ABNORMAL HIGH (ref 65–99)
Glucose-Capillary: 122 mg/dL — ABNORMAL HIGH (ref 65–99)

## 2014-09-15 LAB — LACTIC ACID, PLASMA: Lactic Acid, Venous: 3.2 mmol/L (ref 0.5–2.0)

## 2014-09-15 LAB — URINALYSIS, ROUTINE W REFLEX MICROSCOPIC
Bilirubin Urine: NEGATIVE
GLUCOSE, UA: NEGATIVE mg/dL
Hgb urine dipstick: NEGATIVE
Ketones, ur: NEGATIVE mg/dL
Leukocytes, UA: NEGATIVE
Nitrite: NEGATIVE
PROTEIN: 100 mg/dL — AB
Specific Gravity, Urine: 1.011 (ref 1.005–1.030)
Urobilinogen, UA: 0.2 mg/dL (ref 0.0–1.0)
pH: 7.5 (ref 5.0–8.0)

## 2014-09-15 LAB — ABO/RH: ABO/RH(D): A POS

## 2014-09-15 LAB — CK TOTAL AND CKMB (NOT AT ARMC)
CK TOTAL: 73 U/L (ref 38–234)
CK, MB: 7.5 ng/mL — AB (ref 0.5–5.0)
Relative Index: INVALID (ref 0.0–2.5)

## 2014-09-15 LAB — TROPONIN I
TROPONIN I: 0.18 ng/mL — AB (ref <0.031)
Troponin I: 0.13 ng/mL — ABNORMAL HIGH (ref <0.031)

## 2014-09-15 LAB — URINE MICROSCOPIC-ADD ON

## 2014-09-15 LAB — PREPARE RBC (CROSSMATCH)

## 2014-09-15 LAB — SEDIMENTATION RATE: Sed Rate: 90 mm/hr — ABNORMAL HIGH (ref 0–22)

## 2014-09-15 MED ORDER — TECHNETIUM TO 99M ALBUMIN AGGREGATED
6.0000 | Freq: Once | INTRAVENOUS | Status: AC | PRN
  Administered 2014-09-15: 6 via INTRAVENOUS

## 2014-09-15 MED ORDER — LOSARTAN POTASSIUM 25 MG PO TABS
25.0000 mg | ORAL_TABLET | Freq: Every day | ORAL | Status: DC
  Administered 2014-09-15: 25 mg via ORAL
  Filled 2014-09-15 (×2): qty 1

## 2014-09-15 MED ORDER — LEVOFLOXACIN IN D5W 500 MG/100ML IV SOLN
500.0000 mg | Freq: Once | INTRAVENOUS | Status: AC
  Administered 2014-09-15: 500 mg via INTRAVENOUS
  Filled 2014-09-15: qty 100

## 2014-09-15 MED ORDER — SODIUM CHLORIDE 0.9 % IV BOLUS (SEPSIS)
500.0000 mL | Freq: Once | INTRAVENOUS | Status: AC
  Administered 2014-09-15: 500 mL via INTRAVENOUS

## 2014-09-15 MED ORDER — TECHNETIUM TC 99M DIETHYLENETRIAME-PENTAACETIC ACID: 40.0000 | Freq: Once | INTRAVENOUS | Status: AC | PRN

## 2014-09-15 MED ORDER — SODIUM CHLORIDE 0.9 % IV SOLN
INTRAVENOUS | Status: AC
  Administered 2014-09-15: 02:00:00 via INTRAVENOUS

## 2014-09-15 MED ORDER — SODIUM CHLORIDE 0.9 % IV SOLN: Freq: Once | INTRAVENOUS | Status: DC

## 2014-09-15 MED ORDER — ATENOLOL 12.5 MG HALF TABLET
12.5000 mg | ORAL_TABLET | Freq: Every evening | ORAL | Status: DC
  Administered 2014-09-15 – 2014-09-18 (×4): 12.5 mg via ORAL
  Filled 2014-09-15 (×4): qty 1

## 2014-09-15 MED ORDER — FUROSEMIDE 10 MG/ML IJ SOLN
20.0000 mg | Freq: Once | INTRAMUSCULAR | Status: AC
  Administered 2014-09-16: 20 mg via INTRAVENOUS
  Filled 2014-09-15: qty 2

## 2014-09-15 NOTE — Progress Notes (Signed)
Referring Physician(s): J Kim  Subjective:  Chole drain malfunction  Placed 5/20 in IR with Dr Anselm Pancoast Was draining well every day since release to SNF until Sat Family noted no drainage x 2 days Pt developed fever and abd pain Now admitted  Request for evaluation of drain I have flushed drain without resistance Aspiration is slowish but can asp most of 10 cc flush Output greenish bile    Allergies: Codeine  Medications: Prior to Admission medications   Medication Sig Start Date End Date Taking? Authorizing Provider  acetaminophen (TYLENOL) 650 MG CR tablet Take 650 mg by mouth every 4 (four) hours as needed for pain.   Yes Historical Provider, MD  albuterol (PROVENTIL) (2.5 MG/3ML) 0.083% nebulizer solution Take 3 mLs (2.5 mg total) by nebulization every 2 (two) hours as needed for wheezing or shortness of breath. 09/09/14  Yes Rexene Alberts, MD  Amino Acids-Protein Hydrolys (FEEDING SUPPLEMENT, PRO-STAT SUGAR FREE 64,) LIQD Take 30 mLs by mouth 2 (two) times daily.   Yes Historical Provider, MD  amLODipine (NORVASC) 10 MG tablet Take 10 mg by mouth daily.   Yes Historical Provider, MD  aspirin (ASPIRIN CHILDRENS) 81 MG chewable tablet Chew 1 tablet (81 mg total) by mouth daily. 09/09/14  Yes Rexene Alberts, MD  atenolol (TENORMIN) 50 MG tablet Take 50 mg by mouth daily.   Yes Historical Provider, MD  carbamazepine (TEGRETOL) 200 MG tablet Take 200 mg by mouth 3 (three) times daily.   Yes Historical Provider, MD  ceFEPIme 1 g in dextrose 5 % 50 mL Inject 1 g into the vein every 8 (eight) hours. For 7 days   Yes Historical Provider, MD  donepezil (ARICEPT) 5 MG tablet Take 1 tablet (5 mg total) by mouth at bedtime. 09/09/14  Yes Rexene Alberts, MD  Fe Fum-FA-B Cmp-C-Zn-Mg-Mn-Cu (HEMATINIC PLUS COMPLEX) 106-1 MG TABS Take 1 tablet by mouth daily.   Yes Historical Provider, MD  insulin aspart (NOVOLOG) 100 UNIT/ML injection Sliding scale before each meal and bedtime: Blood sugar 70-120  give 0 units. Blood sugar 121-150 give 2 units. Blood sugar 151-200 give 3 units. Blood sugar 200-250 give 5 units. Blood sugar 251-300 give 8 units. Blood sugar 301-350 give 11 units. Blood sugar 351-400 give 15 units. Blood sugar greater than 400 give 15 units and call doctor. 09/09/14  Yes Rexene Alberts, MD  levofloxacin (LEVAQUIN) 500 MG/100ML SOLN Inject 500 mg into the vein daily. First dose was received 09/14/14 @@ 1000, pt is to receive for 7 days   Yes Historical Provider, MD  levothyroxine (SYNTHROID, LEVOTHROID) 50 MCG tablet Take 50 mcg by mouth daily before breakfast.   Yes Historical Provider, MD  linezolid (ZYVOX) 600 MG/300ML IVPB Inject 300 mLs (600 mg total) into the vein every 12 (twelve) hours. Linezolid should be continued IV and discontinued after the last dose on Friday September 12, 2014. 09/09/14  Yes Rexene Alberts, MD  losartan (COZAAR) 100 MG tablet Take 0.5 tablets (50 mg total) by mouth daily. 09/09/14  Yes Rexene Alberts, MD  meclizine (ANTIVERT) 25 MG tablet Take 25 mg by mouth daily as needed for dizziness.   Yes Historical Provider, MD  memantine (NAMENDA) 10 MG tablet Take 1 tablet (10 mg total) by mouth daily. 09/09/14  Yes Rexene Alberts, MD  Omega-3 Fatty Acids (FISH OIL) 1000 MG CAPS Take 1 capsule by mouth 2 (two) times daily.   Yes Historical Provider, MD  pantoprazole (PROTONIX) 20 MG tablet Take 20 mg by  mouth daily.   Yes Historical Provider, MD  piperacillin-tazobactam (ZOSYN) 3.375 (3-0.375) G injection Inject 3.375 g into the muscle every 6 (six) hours. For 7 days, Start date 09/14/14   Yes Historical Provider, MD  rosuvastatin (CRESTOR) 20 MG tablet Take 20 mg by mouth every evening.   Yes Historical Provider, MD  escitalopram (LEXAPRO) 10 MG tablet Take 10 mg by mouth daily.    Historical Provider, MD  HYDROcodone-acetaminophen (NORCO/VICODIN) 5-325 MG per tablet Take 1 tablet by mouth 2 (two) times daily as needed for moderate pain.  07/08/14   Historical Provider, MD      Vital Signs: BP 167/67 mmHg  Pulse 92  Temp(Src) 98.6 F (37 C) (Oral)  Resp 18  Wt 60.5 kg (133 lb 6.1 oz)  SpO2 100%  Physical Exam  Skin:  Site of chole drain is clean and dry NT No bleeding Output greenish bile 30 cc in bag Flushes well without pain 99 degrees Wbc 26 6/5---rechecking now    Imaging: US Abdomen Limited Ruq  09/15/2014   CLINICAL DATA:  Nausea, vomiting and fever. Cholecystostomy placement 08/29/2014.  EXAM: US ABDOMEN LIMITED - RIGHT UPPER QUADRANT  COMPARISON:  CT and ultrasound 08/25/2014  FINDINGS: Gallbladder:  Decompressed by cholecystostomy tube. The cholecystostomy tube is questionably identified. There is persistent gallbladder wall thickening of 6 mm.  Common bile duct:  Diameter: 10.1 mm at the porta hepatis, 8.2 mm distally.  Liver:  No focal lesion identified. Within normal limits in parenchymal echogenicity. Limited assessment due to positioning.  Technically limited exam due to patient contracted and cholecystostomy bandages in place.  IMPRESSION: 1. Decompressed gallbladder by cholecystostomy tube, the tube is tentatively identified sonographically. There is persistent gallbladder wall thickening of 6 mm. Technically limited evaluation of the gallbladder due to overlying dressing. 2. Development of biliary dilatation, common bile duct 10 mm at the porta hepatis, 8 mm distally. This is a change from prior exam, with the common bile duct measured 3-5 mm. Etiology is uncertain. Choledocholithiasis is not excluded.   Electronically Signed   By: Jeb Levering M.D.   On: 09/15/2014 05:58    Labs:  CBC:  Recent Labs  09/03/14 0613 09/05/14 0612 09/08/14 0611 09/14/14 2021  WBC 12.0* 10.0 9.2 26.3*  HGB 8.6* 8.5* 8.1* 9.0*  HCT 25.7* 26.2* 25.7* 26.6*  PLT 284 323 316 169    COAGS:  Recent Labs  08/25/14 2000 09/14/14 2021  INR 1.41 1.21  APTT 32  --     BMP:  Recent Labs  09/05/14 0612 09/08/14 0611 09/09/14 0651  09/14/14 2021  NA 140 142 142 133*  K 3.5 5.2* 4.2 4.2  CL 101 105 101 92*  CO2 30 27 31 28   GLUCOSE 156* 392* 123* 191*  BUN <5* 5* 8 7  CALCIUM 7.8* 7.6* 7.9* 8.2*  CREATININE 1.20* 1.18* 1.31* 1.37*  GFRNONAA 39* 40* 35* 34*  GFRAA 46* 47* 41* 39*    LIVER FUNCTION TESTS:  Recent Labs  08/26/14 1030 08/29/14 0433 09/08/14 0611 09/14/14 2021  BILITOT 0.4 0.6 0.3 QUANTITY NOT SUFFICIENT, UNABLE TO PERFORM TEST  AST 42* 32 20 26  ALT 54 34 14 16  ALKPHOS 87 207* 69 70  PROT 5.6* 5.6* 5.5* 6.6  ALBUMIN 2.4* 2.1* 2.4* 2.6*    Assessment and Plan:  Chole drain intact Output greenish bile Clean and dry Flushing well Will recheck in am Order in to flush every shift Family has good understanding of plan  Signed: Lillie Portner A 09/15/2014, 9:43 AM   I spent a total of 15 Minutes in face to face in clinical consultation/evaluation, greater than 50% of which was counseling/coordinating care for chole drain check

## 2014-09-15 NOTE — Evaluation (Signed)
Physical Therapy Evaluation Patient Details Name: Tina Hunter MRN: [id number] DOB: [birth year] Today's Date: 09/15/2014   History of Present Illness  81 yo female with dm2, htn, hyperlipidemia,CVA with hemiparesis, hx of acute cholecystitis, s/p percutaneous cholecystostomy, apparently developed last nite, pt is resident of SNF, CXR at SNF was negative, ua pending. Pt apparently has not had any output from her percutaneous cholecystostomy today. Pt sent to hospital and admitted for evaluation of fever as well as lack of output from cholecystostmy.   Clinical Impression  Pt admitted with/for fever.  Pt currently limited functionally due to the problems listed below.  (see problems list.)  Pt will benefit from PT to maximize function and safety to be able to get home safely with available assist of family.     Follow Up Recommendations Home health PT;Supervision/Assistance - 24 hour    Equipment Recommendations  Other (comment) (TBA prior to d/c)    Recommendations for Other Services       Precautions / Restrictions Precautions Precautions: Fall      Mobility  Bed Mobility Overal bed mobility: Needs Assistance Bed Mobility: Rolling;Sidelying to Sit;Sit to Supine Rolling: Total assist Sidelying to sit: Total assist Supine to sit: Total assist     General bed mobility comments: pt made some attempt to help sit up, but L contractured arm not helpful.  Transfers Overall transfer level: Needs assistance   Transfers: Sit to/from Stand Sit to Stand: Max assist;+2 safety/equipment         General transfer comment: stood x2; assist to come forward and power up.  pt bore weight in L LE  Ambulation/Gait             General Gait Details: not tested  Stairs            Wheelchair Mobility    Modified Rankin (Stroke Patients Only) Modified Rankin (Stroke Patients Only) Pre-Morbid Rankin Score: Moderately severe disability     Balance Overall balance  assessment: Needs assistance Sitting-balance support: Single extremity supported Sitting balance-Leahy Scale: Fair Sitting balance - Comments: could not accept challenge, but sat unassisted after prep in midline     Standing balance-Leahy Scale: Zero                               Pertinent Vitals/Pain Pain Assessment: Faces Faces Pain Scale: No hurt    Home Living Family/patient expects to be discharged to:: Private residence Living Arrangements: Children;Other (Comment) (Daughter taking her home) Available Help at Discharge: Family;Available 24 hours/day Type of Home: House         Home Equipment: Other (comment) Photographer)      Prior Function Level of Independence: Needs assistance   Gait / Transfers Assistance Needed: ambulated with a hemi walker and 1 person standby assist, assist with all transfers  ADL's / Homemaking Assistance Needed: able to self feed but assist with all other ADLs        Hand Dominance        Extremity/Trunk Assessment   Upper Extremity Assessment: LUE deficits/detail       LUE Deficits / Details: no functional use of LUE due to old stroke, significant weakness of the RUE   Lower Extremity Assessment: Generalized weakness;LLE deficits/detail;RLE deficits/detail RLE Deficits / Details: R LE generally weak LLE Deficits / Details: significant weakness of L LE, but could bear weight to stand.  Cervical / Trunk Assessment: Kyphotic  Communication   Communication: Expressive  difficulties  Cognition Arousal/Alertness: Awake/alert Behavior During Therapy: Flat affect Overall Cognitive Status: History of cognitive impairments - at baseline                      General Comments General comments (skin integrity, edema, etc.): incontinent in bed.  Extensive pericare needed    Exercises        Assessment/Plan    PT Assessment Patient needs continued PT services  PT Diagnosis Difficulty walking;Generalized  weakness;Hemiplegia non-dominant side   PT Problem List Decreased strength;Decreased activity tolerance;Decreased range of motion;Decreased balance;Decreased mobility;Decreased coordination;Decreased knowledge of use of DME;Decreased knowledge of precautions  PT Treatment Interventions Functional mobility training;Gait training;DME instruction;Therapeutic activities;Balance training;Patient/family education;Neuromuscular re-education   PT Goals (Current goals can be found in the Care Plan section) Acute Rehab PT Goals Patient Stated Goal: none stated PT Goal Formulation: With family Time For Goal Achievement: 09/15/14 Potential to Achieve Goals: Fair    Frequency Min 3X/week   Barriers to discharge        Co-evaluation               End of Session     Patient left: in bed;with call bell/phone within reach;with bed alarm set;with family/visitor present Nurse Communication: Mobility status         Time: 9295-7473 PT Time Calculation (min) (ACUTE ONLY): 30 min   Charges:   PT Evaluation $Initial PT Evaluation Tier I: 1 Procedure PT Treatments $Therapeutic Activity: 8-22 mins   PT G Codes:        Siara Gorder, Tessie Fass 09/15/2014, 2:35 PM  09/15/2014  Donnella Sham, PT [phone]  (pager)

## 2014-09-15 NOTE — Consult Note (Signed)
CARDIOLOGY CONSULT NOTE  Patient ID: Tina Hunter, MRN: [id number], DOB/AGE: 08/16/1924 81 y.o. Admit date: 09/14/2014 Date of Consult: 09/15/2014  Primary Physician: No primary care provider on file. Primary Cardiologist: none Referring Physician: Dr Maudie Mercury  Chief Complaint: fever  Reason for Consultation: elevated troponin  HPI: 81 year old woman who is hospitalized for fever and decreased output from her percutaneous cholecystostomy tube. She was recently hospitalized with acute cholecystitis and VRE bacteremia. The patient was septic and was noted to have lactic acidosis. She also was diagnosed with an Escherichia coli urinary tract infection. She was discharged to a skilled nursing facility. She is hospitalized now for problems outlined above with fever, nausea, and weakness. A troponin was ordered because of tachycardia and was found to be elevated. Cardiology consult is placed for evaluation of elevated troponin.  The patient has had no past history of cardiac problems. During my interview, there are 2 daughters at the bedside. The patient is really not able to provide any history, but she does deny chest pain or pressure. She is not short of breath. Otherwise history obtained is very limited. The patient was living with one of her daughters prior to her hospitalization last month. She had a remote stroke. Prior to her last hospitalization, she was able to ambulate with assistance.  Medical History:  Past Medical History  Diagnosis Date  . Stroke   . Diabetes mellitus without complication   . Hypertension   . High cholesterol   . Brain tumor (benign)   . Chronic bronchitis   . Seizures   . Bacteremia due to vancomycin resistant Enterococcus 08/25/2014  . Sepsis secondary to UTI 08/25/2014    and acute cholecystitis/VRE bacteremia.  . Cholecystitis, acute 08/25/2014      Surgical History:  Past Surgical History  Procedure Laterality Date  . Brain surgery    . Abdominal  hysterectomy    . Ct perc cholecystostomy       Home Meds: Prior to Admission medications   Medication Sig Start Date End Date Taking? Authorizing Provider  acetaminophen (TYLENOL) 650 MG CR tablet Take 650 mg by mouth every 4 (four) hours as needed for pain.   Yes Historical Provider, MD  albuterol (PROVENTIL) (2.5 MG/3ML) 0.083% nebulizer solution Take 3 mLs (2.5 mg total) by nebulization every 2 (two) hours as needed for wheezing or shortness of breath. 09/09/14  Yes Rexene Alberts, MD  Amino Acids-Protein Hydrolys (FEEDING SUPPLEMENT, PRO-STAT SUGAR FREE 64,) LIQD Take 30 mLs by mouth 2 (two) times daily.   Yes Historical Provider, MD  amLODipine (NORVASC) 10 MG tablet Take 10 mg by mouth daily.   Yes Historical Provider, MD  aspirin (ASPIRIN CHILDRENS) 81 MG chewable tablet Chew 1 tablet (81 mg total) by mouth daily. 09/09/14  Yes Rexene Alberts, MD  atenolol (TENORMIN) 50 MG tablet Take 50 mg by mouth daily.   Yes Historical Provider, MD  carbamazepine (TEGRETOL) 200 MG tablet Take 200 mg by mouth 3 (three) times daily.   Yes Historical Provider, MD  ceFEPIme 1 g in dextrose 5 % 50 mL Inject 1 g into the vein every 8 (eight) hours. For 7 days   Yes Historical Provider, MD  donepezil (ARICEPT) 5 MG tablet Take 1 tablet (5 mg total) by mouth at bedtime. 09/09/14  Yes Rexene Alberts, MD  Fe Fum-FA-B Cmp-C-Zn-Mg-Mn-Cu (HEMATINIC PLUS COMPLEX) 106-1 MG TABS Take 1 tablet by mouth daily.   Yes Historical Provider, MD  insulin aspart (NOVOLOG) 100 UNIT/ML injection Sliding  scale before each meal and bedtime: Blood sugar 70-120 give 0 units. Blood sugar 121-150 give 2 units. Blood sugar 151-200 give 3 units. Blood sugar 200-250 give 5 units. Blood sugar 251-300 give 8 units. Blood sugar 301-350 give 11 units. Blood sugar 351-400 give 15 units. Blood sugar greater than 400 give 15 units and call doctor. 09/09/14  Yes Rexene Alberts, MD  levofloxacin (LEVAQUIN) 500 MG/100ML SOLN Inject 500 mg into the vein  daily. First dose was received 09/14/14 @@ 1000, pt is to receive for 7 days   Yes Historical Provider, MD  levothyroxine (SYNTHROID, LEVOTHROID) 50 MCG tablet Take 50 mcg by mouth daily before breakfast.   Yes Historical Provider, MD  linezolid (ZYVOX) 600 MG/300ML IVPB Inject 300 mLs (600 mg total) into the vein every 12 (twelve) hours. Linezolid should be continued IV and discontinued after the last dose on Friday September 12, 2014. 09/09/14  Yes Rexene Alberts, MD  losartan (COZAAR) 100 MG tablet Take 0.5 tablets (50 mg total) by mouth daily. 09/09/14  Yes Rexene Alberts, MD  meclizine (ANTIVERT) 25 MG tablet Take 25 mg by mouth daily as needed for dizziness.   Yes Historical Provider, MD  memantine (NAMENDA) 10 MG tablet Take 1 tablet (10 mg total) by mouth daily. 09/09/14  Yes Rexene Alberts, MD  Omega-3 Fatty Acids (FISH OIL) 1000 MG CAPS Take 1 capsule by mouth 2 (two) times daily.   Yes Historical Provider, MD  pantoprazole (PROTONIX) 20 MG tablet Take 20 mg by mouth daily.   Yes Historical Provider, MD  piperacillin-tazobactam (ZOSYN) 3.375 (3-0.375) G injection Inject 3.375 g into the muscle every 6 (six) hours. For 7 days, Start date 09/14/14   Yes Historical Provider, MD  rosuvastatin (CRESTOR) 20 MG tablet Take 20 mg by mouth every evening.   Yes Historical Provider, MD  escitalopram (LEXAPRO) 10 MG tablet Take 10 mg by mouth daily.    Historical Provider, MD  HYDROcodone-acetaminophen (NORCO/VICODIN) 5-325 MG per tablet Take 1 tablet by mouth 2 (two) times daily as needed for moderate pain.  07/08/14   Historical Provider, MD    Inpatient Medications:  . amLODipine  10 mg Oral Daily  . aspirin  81 mg Oral Daily  . atenolol  50 mg Oral Daily  . carbamazepine  200 mg Oral TID  . enoxaparin (LOVENOX) injection  30 mg Subcutaneous Q24H  . escitalopram  10 mg Oral Daily  . insulin aspart  0-5 Units Subcutaneous QHS  . insulin aspart  0-9 Units Subcutaneous TID WC  . levothyroxine  50 mcg Oral QAC  breakfast  . losartan  50 mg Oral Daily  . memantine  10 mg Oral Daily  . pantoprazole  40 mg Oral BID  . piperacillin-tazobactam (ZOSYN)  IV  3.375 g Intravenous Q8H  . rosuvastatin  20 mg Oral QPM  . sodium chloride  3 mL Intravenous Q12H      Allergies:  Allergies  Allergen Reactions  . Codeine Other (See Comments)    Unknown reaction    History   Social History  . Marital Status: Widowed    Spouse Name: N/A  . Number of Children: N/A  . Years of Education: N/A   Occupational History  . Not on file.   Social History Main Topics  . Smoking status: Never Smoker   . Smokeless tobacco: Not on file  . Alcohol Use: No  . Drug Use: No  . Sexual Activity: Not on file   Other Topics  Concern  . Not on file   Social History Narrative     Family History  Problem Relation Age of Onset  . Diabetes Sister      Review of Systems: Unable to obtain an accurate review of systems  Physical Exam: Blood pressure 167/67, pulse 92, temperature 98.6 F (37 C), temperature source Oral, resp. rate 18, weight 133 lb 6.1 oz (60.5 kg), SpO2 100 %. Pt is elderly, somnolent, arousable and able to answer a few questions HEENT: Atraumatic Neck: JVP normal. Carotid upstrokes normal without bruits. No thyromegaly. Lungs: equal expansion, clear bilaterally CV: Apex is discrete and nondisplaced, RRR without murmur or gallop Abd: soft, NT, +BS Back: no CVA tenderness Ext: no C/C/E Skin: warm and dry without rash   Labs:  Recent Labs  09/14/14 2021 09/15/14 0100  CKTOTAL  --  73  CKMB  --  7.5*  TROPONINI 0.19* 0.18*   Lab Results  Component Value Date   WBC 26.3* 09/14/2014   HGB 9.0* 09/14/2014   HCT 26.6* 09/14/2014   MCV 91.1 09/14/2014   PLT 169 09/14/2014    Recent Labs Lab 09/14/14 2021  NA 133*  K 4.2  CL 92*  CO2 28  BUN 7  CREATININE 1.37*  CALCIUM 8.2*  PROT 6.6  BILITOT QUANTITY NOT SUFFICIENT, UNABLE TO PERFORM TEST  ALKPHOS 70  ALT 16  AST 26    GLUCOSE 191*   No results found for: CHOL, HDL, LDLCALC, TRIG Lab Results  Component Value Date   DDIMER 3.44* 09/14/2014    Radiology/Studies:  Ct Abdomen Pelvis Wo Contrast  08/25/2014   CLINICAL DATA:  Fever and weakness  EXAM: CT ABDOMEN AND PELVIS WITHOUT CONTRAST  TECHNIQUE: Multidetector CT imaging of the abdomen and pelvis was performed following the standard protocol without IV contrast.  COMPARISON:  None.  FINDINGS: Lung bases are free of acute infiltrate or sizable effusion. A large hiatal hernia is noted.  The liver, spleen, adrenal glands and pancreas are within normal limits. The gallbladder is well distended demonstrates significant wall thickening with pericholecystic inflammatory changes and small dependent gallstones. These changes are suggestive of acute cholecystitis in the appropriate clinical setting. Nonobstructing renal calculi are noted. A large right renal cyst is seen measuring 4.5 cm.  The colon demonstrates diffuse diverticular change. No diverticulitis or abscess is identified. The bladder is well distended. The uterus has been surgically removed. The bony structures are within normal limits. There are changes consistent with an IVC filter.  IMPRESSION: Changes suggestive of acute cholecystitis with gallstones. Ultrasound may be helpful for further evaluation. Correlation with physical exam is recommended as well.  Right renal cyst.  No other focal abnormality is seen.   Electronically Signed   By: Inez Catalina M.D.   On: 08/25/2014 12:54   Dg Chest 1 View  08/25/2014   CLINICAL DATA:  Weakness  EXAM: CHEST  1 VIEW  COMPARISON:  03/11/2014  FINDINGS: Cardiac shadow is mildly enlarged but stable. Diffuse interstitial changes are again identified and stable from the prior exam. Multiple old left rib fractures are again seen. The previously noted hiatal hernia is less well appreciated on the current exam.  IMPRESSION: No acute abnormality noted.   Electronically Signed    By: Inez Catalina M.D.   On: 08/25/2014 11:41   Ct Head Wo Contrast  09/05/2014   CLINICAL DATA:  Dysarthria  EXAM: CT HEAD WITHOUT CONTRAST  TECHNIQUE: Contiguous axial images were obtained from the base of  the skull through the vertex without intravenous contrast.  COMPARISON:  08/25/2014  FINDINGS: Bony calvarium again demonstrates postoperative change. Diffuse atrophic changes are noted. No findings to suggest acute hemorrhage, acute infarction or space-occupying mass lesion are. Changes of prior infarct in the right middle cerebral artery distribution is noted. Scattered white matter ischemic changes noted as well.  IMPRESSION: No change from the prior exam. Chronic atrophic and ischemic changes are again seen.   Electronically Signed   By: Inez Catalina M.D.   On: 09/05/2014 20:11   Ct Head Wo Contrast  08/25/2014   CLINICAL DATA:  Recent change in mental status with weakness  EXAM: CT HEAD WITHOUT CONTRAST  TECHNIQUE: Contiguous axial images were obtained from the base of the skull through the vertex without intravenous contrast.  COMPARISON:  03/11/2014  FINDINGS: Bony calvarium again demonstrates postoperative changes on the right. Diffuse atrophic changes are noted. Some areas of encephalomalacia related to the previous surgery are noted. Chronic white matter ischemic changes noted as well. No findings to suggest acute hemorrhage, acute infarction or space-occupying mass lesion are noted. An old lacunar infarct is noted within the basal ganglia on the left.  IMPRESSION: Chronic atrophic and ischemic changes.  No acute abnormality noted.   Electronically Signed   By: Inez Catalina M.D.   On: 08/25/2014 11:34   US Abdomen Complete  08/25/2014   CLINICAL DATA:  81 year old female with a history of fever and weakness. CT of the same date demonstrates changes of potential acute cholecystitis.  EXAM: ULTRASOUND ABDOMEN COMPLETE  COMPARISON:  CT 08/25/2014  FINDINGS: Gallbladder: Gallbladder distended with  thickened wall measuring greater than 5 mm. Echogenic material at the fundus of the gallbladder layer dependently. No pericholecystic fluid. Sonographic Percell Miller sign is reported negative.  Common bile duct: Diameter: 3.1 mm  Liver: No focal lesion identified. Within normal limits in parenchymal echogenicity.  IVC: No abnormality visualized.  Pancreas: Visualized portion unremarkable.  Spleen: Size and appearance within normal limits.  Right Kidney: Length: 10.2 cm. Echogenicity of the right kidney similar to that of the adjacent liver. No hydronephrosis. Anechoic cystic structure measuring 5.2 cm x 4.4 cm x 5.0 cm, compatible with Bosniak 1 cyst. Nonobstructive stone which is present on the comparison CT is not visualized  Left Kidney: Length: 8.0 cm. Echogenicity of the left kidney similar to that of the adjacent spleen. No evidence of hydronephrosis.  Abdominal aorta: No aneurysm visualized.  Other findings: None.  IMPRESSION: Sonographic findings of the gallbladder are suspicious for acute cholecystitis, however are equivocal, as the sonographic Percell Miller sign is reportedly negative. There is suspicious gallbladder wall thickening and echogenic debris/stones within the gallbladder. If there is need to evaluate for ductal obstruction, nuclear medicine HIDA study would be recommended.  Signed,  Dulcy Fanny. Earleen Newport, DO  [facility]  [facility]   Electronically Signed   By: Corrie Mckusick D.O.   On: 08/25/2014 15:33   Nm Hepato W/eject Fract  08/28/2014   CLINICAL DATA:  RIGHT upper quadrant abdominal pain, abnormal gallbladder by ultrasound showing gallstones, distension and thickened wall  EXAM: NUCLEAR MEDICINE HEPATOBILIARY IMAGING  TECHNIQUE: Sequential images of the abdomen were obtained out to 60 minutes following intravenous administration of radiopharmaceutical.  RADIOPHARMACEUTICALS:  5.4 mCi Tc-55m Choletec IV  Pharmaceutical:  2.6 mg morphine sulfate IV   COMPARISON:  Ultrasound abdomen 08/25/2014  FINDINGS: Normal tracer extraction by bloodstream.  Prolonged hepatocellular retention of tracer indicating a degree of hepatocellular dysfunction.  Excretion of tracer into the biliary tree is identified with bowel visualized by 18 minutes.  At 36 minutes gallbladder had not visualized.  Patient then received morphine IV and imaging was continued for an additional 30 minutes.  Gallbladder failed to visualized following morphine augmentation.  IMPRESSION: Patent common hepatic duct and common bile duct with passage of tracer into small bowel.  Hepatocellular dysfunction evidenced by prolonged hepatic retention of tracer.  Nonvisualization of the gallbladder despite morphine augmentation consistent with acute cholecystitis; please note that false-positive exams can occur however in patients with prolonged fasting.   Electronically Signed   By: Lavonia Dana M.D.   On: 08/28/2014 16:51   Ir Perc Cholecystostomy  08/29/2014   CLINICAL DATA:  81 year old with sepsis and acute cholecystitis.  EXAM: ULTRASOUND AND FLUOROSCOPIC GUIDED PERCUTANEOUS CHOLECYSTOSTOMY TUBE PLACEMENT  Physician: Stephan Minister. Henn, MD  FLUOROSCOPY TIME:  54 seconds, 16 mGy  MEDICATIONS: 0.5 mg Versed.  25 mcg fentanyl  ANESTHESIA/SEDATION: 11 minutes  PROCEDURE: Informed consent was obtained from the patient's daughter. Patient was placed supine on the interventional table. Patient's head was slightly elevated in order to improve breathing. Ultrasound was performed of the right upper quadrant. The right upper abdomen was prepped and draped in sterile fashion. Maximal barrier sterile technique was utilized including caps, mask, sterile gowns, sterile gloves, sterile drape, hand hygiene and skin antiseptic. Skin was anesthetized with 1% lidocaine. A 22 gauge Chiba needle was directed into the gallbladder through the right hepatic lobe with ultrasound guidance. Needle position was confirmed within the  gallbladder. Yellow-green purulent fluid was aspirated. A 0.018 wire was placed. Accustick dilator set was placed. The tract was dilated to accommodate a 10.2 [place] multipurpose drain. Approximately 30 mL of thick yellow purulent fluid was aspirated. Catheter was sutured to the skin. Ultrasound of the right upper quadrant demonstrated decompression of the gallbladder. Catheter was sutured to the skin and attached to gravity bag. Fluoroscopic and ultrasound images were taken and saved for documentation. Fluid was sent for culture.  FINDINGS: There was a thickening gallbladder with echogenic stones or sludge. Needle position confirmed within the gallbladder. Approximately 30 mL of purulent fluid was removed from the gallbladder. The gallbladder was decompressed at the end of the procedure.  Estimated blood loss: Minimal  COMPLICATIONS: None  IMPRESSION: Successful ultrasound and fluoroscopic guided cholecystostomy tube placement.   Electronically Signed   By: Markus Daft M.D.   On: 08/29/2014 18:31   Dg Chest Port 1 View  09/04/2014   CLINICAL DATA:  Acute heart failure. Renal failure, sepsis, urinary tract infection and cholecystitis. History of diabetes, hypertension, stroke and brain tumor.  EXAM: PORTABLE CHEST - 1 VIEW  COMPARISON:  08/28/2014 and 08/25/2014.  FINDINGS: 1425 hours. Patient is rotated to the right. Right arm PICC appears unchanged at the lower SVC level. Allowing for the rotation, the heart size and mediastinal contours are stable. There are persistent low lung volumes with bibasilar atelectasis and possible mild edema. No confluent airspace opacity or significant pleural effusion identified.  IMPRESSION: No significant change in bibasilar atelectasis and possible mild edema.   Electronically Signed   By: Richardean Sale M.D.   On: 09/04/2014 14:51   Dg Chest Port 1 View  08/28/2014   CLINICAL DATA:  Shortness of breath, urinary tract infection and sepsis, previous CVA, chronic bronchitis.   EXAM: PORTABLE CHEST - 1 VIEW  COMPARISON:  Portable chest x-ray of Aug 25, 2014  FINDINGS: The lungs are mildly hypoinflated. The  pulmonary interstitial markings are more conspicuous today especially at the right lung base. The cardiac silhouette remains enlarged. The central pulmonary vascularity is engorged. The bony thorax exhibits no acute abnormality.  IMPRESSION: Mild hypo inflation which accentuates the lung markings. However, low-grade CHF is suspected. Interval development of subsegmental atelectasis at the right lung base is suspected. If the patient can tolerate the procedure, a PA and lateral chest x-ray would be useful.   Electronically Signed   By: David  Martinique M.D.   On: 08/28/2014 12:03   US Abdomen Limited Ruq  09/15/2014   CLINICAL DATA:  Nausea, vomiting and fever. Cholecystostomy placement 08/29/2014.  EXAM: US ABDOMEN LIMITED - RIGHT UPPER QUADRANT  COMPARISON:  CT and ultrasound 08/25/2014  FINDINGS: Gallbladder:  Decompressed by cholecystostomy tube. The cholecystostomy tube is questionably identified. There is persistent gallbladder wall thickening of 6 mm.  Common bile duct:  Diameter: 10.1 mm at the porta hepatis, 8.2 mm distally.  Liver:  No focal lesion identified. Within normal limits in parenchymal echogenicity. Limited assessment due to positioning.  Technically limited exam due to patient contracted and cholecystostomy bandages in place.  IMPRESSION: 1. Decompressed gallbladder by cholecystostomy tube, the tube is tentatively identified sonographically. There is persistent gallbladder wall thickening of 6 mm. Technically limited evaluation of the gallbladder due to overlying dressing. 2. Development of biliary dilatation, common bile duct 10 mm at the porta hepatis, 8 mm distally. This is a change from prior exam, with the common bile duct measured 3-5 mm. Etiology is uncertain. Choledocholithiasis is not excluded.   Electronically Signed   By: Jeb Levering M.D.   On:  09/15/2014 05:58    EKG: NSR 97 bpm, nonspecific T wave abnormality  Cardiac Studies: 2D Echo 08/30/2014: Study Conclusions  - Left ventricle: The cavity size was normal. There was mild concentric hypertrophy. Systolic function was vigorous. The estimated ejection fraction was in the range of 65% to 70%. Wall motion was normal; there were no regional wall motion abnormalities. Doppler parameters are consistent with abnormal left ventricular relaxation (grade 1 diastolic dysfunction). Doppler parameters are consistent with high ventricular filling pressure. - Aortic valve: Mildly to moderately calcified annulus. Trileaflet. - Mitral valve: Mildly calcified annulus. Mildly calcified leaflets . There was mild regurgitation. - Left atrium: The atrium was moderately dilated. - Tricuspid valve: There was mild regurgitation.  ASSESSMENT AND PLAN:  Elevated troponin: Laboratory and radiographic data reviewed. History obtained from the patient's daughters. This elderly patient with noncardiac medical illness has a low level, flat troponin trend. I do not think this is indicative of an acute coronary syndrome. Considering her advanced age and medical problems with markedly reduced functional capacity, it would not be appropriate to do any further cardiac testing. In fact, I think the less we do the better this patient will be. I would not recommend any cardiac workup at this point. She just had an echocardiogram a few weeks ago and it does not need to be repeated. Please call if we can be of any assistance.  SignedSherren Mocha MD, [facility] 09/15/2014, 8:10 AM

## 2014-09-15 NOTE — Progress Notes (Signed)
Initial Nutrition Assessment  DOCUMENTATION CODES:  Not applicable  INTERVENTION:   (Advance diet to Dysphagia 1, nectar thick liquids as medically appropriate)  NUTRITION DIAGNOSIS:  Inadequate oral intake related to inability to eat as evidenced by NPO status  GOAL:  Patient will meet greater than or equal to 90% of their needs  MONITOR:  Diet advancement, PO intake, Weight trends, Labs, I & O's  REASON FOR ASSESSMENT:  Low Braden  ASSESSMENT: 81 yo Female with DM Type II, HTN, hyperlipidemia, CVA with hemiparesis,acute cholecystitis, s/p percutaneous cholecystostomy; sent to hospital and admitted for evaluation of fever as well as lack of output from cholecystostmy.   Pt currently in ECHO LAB.  RD unable to obtain nutrition hx.  Currently NPO.  Seen per Clinical Nutrition during previous hospital admission in May 2016.  Has hx of dysphagia.  Previously on a Dys 1, nectar thick liquid diet per SLP recommendation.  RD unable to complete Nutrition Focused Physical Exam at this time.  Height:  Ht Readings from Last 1 Encounters:  08/25/14 5\' 4"  (1.626 m)    Weight:  Wt Readings from Last 1 Encounters:  09/15/14 133 lb 6.1 oz (60.5 kg)    Ideal Body Weight:  55 kg  Wt Readings from Last 10 Encounters:  09/15/14 133 lb 6.1 oz (60.5 kg)  09/09/14 130 lb 12.8 oz (59.33 kg)  03/12/14 143 lb 4.8 oz (65 kg)    BMI:  Body mass index is 22.88 kg/(m^2).  Estimated Nutritional Needs:  Kcal:  1600-1800  Protein:  70-80 gm  Fluid:  1.6-1.8 L  Skin:  Reviewed, no issues  Diet Order:  Diet NPO time specified Except for: Ice Chips, Sips with Meds  EDUCATION NEEDS:  No education needs identified at this time   Intake/Output Summary (Last 24 hours) at 09/15/14 1407 Last data filed at 09/14/14 2300  Gross per 24 hour  Intake  165.5 ml  Output      0 ml  Net  165.5 ml    Last BM:  6/5  Arthur Holms, RD, LDN Pager #: [phone] After-Hours Pager #:  [phone]

## 2014-09-15 NOTE — Progress Notes (Signed)
CSW called pt daughter to assess for patient return to [facility] at time of DC- CSW unable to talk with daughter- left message.  CSW will continue to follow.  Domenica Reamer, [place] Social Worker [phone]

## 2014-09-15 NOTE — Consult Note (Signed)
Reason for Consult: acute cholecystitis s/p percutaneous cholecystostomy tube.  Malfunctioning cholecystostomy tube Referring Physician: Fr. Jani Gravel   HPI: Tina Hunter is a 81 year old female with a history of CVA with left sided hemiparesis, HTN, seizure disorder, anemia of chronic disease, hypothyroidism, DM II who was hospitalized at [facility] 5/16-5/31 with sepsis, E coli UTI, AKI and acute cholecystitis which was treated with a cholecystostomy tube and followed by Dr. Arnoldo Morale.  She presented to [facility] with apparent fevers and no output from the cholecystostomy tube.  We have therefore been asked to evaluate.  The patient is unable to provide any information.    WBC 26.3K, normal LFTs, negative UA.   Abdominal US shows a decompressed gallbladder by cholecystostomy tube with persistent gallbladder wall thickening.  Common bile duct is dilated at 27m which has changed from the previous exam.  T max 99.9, tachycardic low 100s, blood pressure is stable.     Past Medical History  Diagnosis Date  . Stroke   . Diabetes mellitus without complication   . Hypertension   . High cholesterol   . Brain tumor (benign)   . Chronic bronchitis   . Seizures   . Bacteremia due to vancomycin resistant Enterococcus 08/25/2014  . Sepsis secondary to UTI 08/25/2014    and acute cholecystitis/VRE bacteremia.  . Cholecystitis, acute 08/25/2014    Past Surgical History  Procedure Laterality Date  . Brain surgery    . Abdominal hysterectomy    . Ct perc cholecystostomy      Family History  Problem Relation Age of Onset  . Diabetes Sister     Social History:  reports that she has never smoked. She does not have any smokeless tobacco history on file. She reports that she does not drink alcohol or use illicit drugs.  Allergies:  Allergies  Allergen Reactions  . Codeine Other (See Comments)    Unknown reaction    Medications:  Scheduled Meds: . amLODipine  10 mg Oral Daily  . aspirin  81 mg Oral Daily   . atenolol  12.5 mg Oral QPM  . atenolol  50 mg Oral Daily  . carbamazepine  200 mg Oral TID  . enoxaparin (LOVENOX) injection  30 mg Subcutaneous Q24H  . escitalopram  10 mg Oral Daily  . insulin aspart  0-5 Units Subcutaneous QHS  . insulin aspart  0-9 Units Subcutaneous TID WC  . levothyroxine  50 mcg Oral QAC breakfast  . losartan  25 mg Oral Daily  . memantine  10 mg Oral Daily  . pantoprazole  40 mg Oral BID  . piperacillin-tazobactam (ZOSYN)  IV  3.375 g Intravenous Q8H  . rosuvastatin  20 mg Oral QPM  . sodium chloride  3 mL Intravenous Q12H   Continuous Infusions:  PRN Meds:.acetaminophen **OR** acetaminophen, albuterol   Results for orders placed or performed during the hospital encounter of 09/14/14 (from the past 48 hour(s))  Glucose, capillary     Status: Abnormal   Collection Time: 09/14/14  6:06 PM  Result Value Ref Range   Glucose-Capillary 213 (H) 65 - 99 mg/dL  MRSA PCR Screening     Status: None   Collection Time: 09/14/14  6:51 PM  Result Value Ref Range   MRSA by PCR NEGATIVE NEGATIVE    Comment:        The GeneXpert MRSA Assay (FDA approved for NASAL specimens only), is one component of a comprehensive MRSA colonization surveillance program. It is not intended to diagnose  MRSA infection nor to guide or monitor treatment for MRSA infections.   Carbamazepine level, total     Status: Abnormal   Collection Time: 09/14/14  8:12 PM  Result Value Ref Range   Carbamazepine Lvl 3.1 (L) 4.0 - 12.0 ug/mL  TSH     Status: None   Collection Time: 09/14/14  8:12 PM  Result Value Ref Range   TSH 2.862 0.350 - 4.500 uIU/mL  Lactic acid, plasma     Status: Abnormal   Collection Time: 09/14/14  8:21 PM  Result Value Ref Range   Lactic Acid, Venous 3.2 (HH) 0.5 - 2.0 mmol/L    Comment: REPEATED TO VERIFY CRITICAL RESULT CALLED TO, READ BACK BY AND VERIFIED WITH: Bernarda Caffey [phone] [place] DATE 09/14/14   CBC with Differential/Platelet      Status: Abnormal   Collection Time: 09/14/14  8:21 PM  Result Value Ref Range   WBC 26.3 (H) 4.0 - 10.5 K/uL   RBC 2.92 (L) 3.87 - 5.11 MIL/uL   Hemoglobin 9.0 (L) 12.0 - 15.0 g/dL   HCT 26.6 (L) 36.0 - 46.0 %   MCV 91.1 78.0 - 100.0 fL   MCH 30.8 26.0 - 34.0 pg   MCHC 33.8 30.0 - 36.0 g/dL   RDW 13.7 11.5 - 15.5 %   Platelets 169 150 - 400 K/uL   Neutrophils Relative % 83 (H) 43 - 77 %   Lymphocytes Relative 8 (L) 12 - 46 %   Monocytes Relative 8 3 - 12 %   Eosinophils Relative 1 0 - 5 %   Basophils Relative 0 0 - 1 %   Neutro Abs 21.8 (H) 1.7 - 7.7 K/uL   Lymphs Abs 2.1 0.7 - 4.0 K/uL   Monocytes Absolute 2.1 (H) 0.1 - 1.0 K/uL   Eosinophils Absolute 0.3 0.0 - 0.7 K/uL   Basophils Absolute 0.0 0.0 - 0.1 K/uL   Smear Review MORPHOLOGY UNREMARKABLE   Comprehensive metabolic panel     Status: Abnormal   Collection Time: 09/14/14  8:21 PM  Result Value Ref Range   Sodium 133 (L) 135 - 145 mmol/L   Potassium 4.2 3.5 - 5.1 mmol/L   Chloride 92 (L) 101 - 111 mmol/L   CO2 28 22 - 32 mmol/L   Glucose, Bld 191 (H) 65 - 99 mg/dL   BUN 7 6 - 20 mg/dL   Creatinine, Ser 1.37 (H) 0.44 - 1.00 mg/dL   Calcium 8.2 (L) 8.9 - 10.3 mg/dL   Total Protein 6.6 6.5 - 8.1 g/dL   Albumin 2.6 (L) 3.5 - 5.0 g/dL   AST 26 15 - 41 U/L   ALT 16 14 - 54 U/L   Alkaline Phosphatase 70 38 - 126 U/L   Total Bilirubin QUANTITY NOT SUFFICIENT, UNABLE TO PERFORM TEST 0.3 - 1.2 mg/dL   GFR calc non Af Amer 34 (L) >60 mL/min   GFR calc Af Amer 39 (L) >60 mL/min    Comment: (NOTE) The eGFR has been calculated using the CKD EPI equation. This calculation has not been validated in all clinical situations. eGFR's persistently <60 mL/min signify possible Chronic Kidney Disease.    Anion gap 13 5 - 15  Protime-INR     Status: Abnormal   Collection Time: 09/14/14  8:21 PM  Result Value Ref Range   Prothrombin Time 15.4 (H) 11.6 - 15.2 seconds   INR 1.21 0.00 - 1.49  Troponin I (q 6hr x 3)  Status: Abnormal    Collection Time: 09/14/14  8:21 PM  Result Value Ref Range   Troponin I 0.19 (H) <0.031 ng/mL    Comment:        PERSISTENTLY INCREASED TROPONIN VALUES IN THE RANGE OF 0.04-0.49 ng/mL CAN BE SEEN IN:       -UNSTABLE ANGINA       -CONGESTIVE HEART FAILURE       -MYOCARDITIS       -CHEST TRAUMA       -ARRYHTHMIAS       -LATE PRESENTING MYOCARDIAL INFARCTION       -COPD   CLINICAL FOLLOW-UP RECOMMENDED.   D-dimer, quantitative (not at [facility] [facility])     Status: Abnormal   Collection Time: 09/14/14  8:21 PM  Result Value Ref Range   D-Dimer, Quant 3.44 (H) 0.00 - 0.48 ug/mL-FEU    Comment:        AT THE INHOUSE ESTABLISHED CUTOFF VALUE OF 0.48 ug/mL FEU, THIS ASSAY HAS BEEN DOCUMENTED IN THE LITERATURE TO HAVE A SENSITIVITY AND NEGATIVE PREDICTIVE VALUE OF AT LEAST 98 TO 99%.  THE TEST RESULT SHOULD BE CORRELATED WITH AN ASSESSMENT OF THE CLINICAL PROBABILITY OF DVT / VTE.   Glucose, capillary     Status: Abnormal   Collection Time: 09/14/14  9:47 PM  Result Value Ref Range   Glucose-Capillary 179 (H) 65 - 99 mg/dL  Lactic acid, plasma     Status: Abnormal   Collection Time: 09/14/14 10:05 PM  Result Value Ref Range   Lactic Acid, Venous 2.7 (HH) 0.5 - 2.0 mmol/L    Comment: REPEATED TO VERIFY CRITICAL RESULT CALLED TO, READ BACK BY AND VERIFIED WITH: Bernarda Caffey [phone] [facility]   Urinalysis, Routine w reflex microscopic (not at [facility])     Status: Abnormal   Collection Time: 09/15/14 12:05 AM  Result Value Ref Range   Color, Urine YELLOW YELLOW   APPearance CLEAR CLEAR   Specific Gravity, Urine 1.011 1.005 - 1.030   pH 7.5 5.0 - 8.0   Glucose, UA NEGATIVE NEGATIVE mg/dL   Hgb urine dipstick NEGATIVE NEGATIVE   Bilirubin Urine NEGATIVE NEGATIVE   Ketones, ur NEGATIVE NEGATIVE mg/dL   Protein, ur 100 (A) NEGATIVE mg/dL   Urobilinogen, UA 0.2 0.0 - 1.0 mg/dL   Nitrite NEGATIVE NEGATIVE   Leukocytes, UA NEGATIVE NEGATIVE  Urine microscopic-add on     Status: None    Collection Time: 09/15/14 12:05 AM  Result Value Ref Range   Squamous Epithelial / LPF RARE RARE   WBC, UA 0-2 <3 WBC/hpf   RBC / HPF 0-2 <3 RBC/hpf  Troponin I (q 6hr x 3)     Status: Abnormal   Collection Time: 09/15/14  1:00 AM  Result Value Ref Range   Troponin I 0.18 (H) <0.031 ng/mL    Comment:        PERSISTENTLY INCREASED TROPONIN VALUES IN THE RANGE OF 0.04-0.49 ng/mL CAN BE SEEN IN:       -UNSTABLE ANGINA       -CONGESTIVE HEART FAILURE       -MYOCARDITIS       -CHEST TRAUMA       -ARRYHTHMIAS       -LATE PRESENTING MYOCARDIAL INFARCTION       -COPD   CLINICAL FOLLOW-UP RECOMMENDED.   CK total and CKMB (cardiac)not at [facility]     Status: Abnormal   Collection Time: 09/15/14  1:00 AM  Result Value Ref Range  Total CK 73 38 - 234 U/L   CK, MB 7.5 (H) 0.5 - 5.0 ng/mL   Relative Index RELATIVE INDEX IS INVALID 0.0 - 2.5    Comment: WHEN CK < 100 U/L          Glucose, capillary     Status: Abnormal   Collection Time: 09/15/14  8:22 AM  Result Value Ref Range   Glucose-Capillary 157 (H) 65 - 99 mg/dL    US Abdomen Limited Ruq  09/15/2014   CLINICAL DATA:  Nausea, vomiting and fever. Cholecystostomy placement 08/29/2014.  EXAM: US ABDOMEN LIMITED - RIGHT UPPER QUADRANT  COMPARISON:  CT and ultrasound 08/25/2014  FINDINGS: Gallbladder:  Decompressed by cholecystostomy tube. The cholecystostomy tube is questionably identified. There is persistent gallbladder wall thickening of 6 mm.  Common bile duct:  Diameter: 10.1 mm at the porta hepatis, 8.2 mm distally.  Liver:  No focal lesion identified. Within normal limits in parenchymal echogenicity. Limited assessment due to positioning.  Technically limited exam due to patient contracted and cholecystostomy bandages in place.  IMPRESSION: 1. Decompressed gallbladder by cholecystostomy tube, the tube is tentatively identified sonographically. There is persistent gallbladder wall thickening of 6 mm. Technically limited evaluation of  the gallbladder due to overlying dressing. 2. Development of biliary dilatation, common bile duct 10 mm at the porta hepatis, 8 mm distally. This is a change from prior exam, with the common bile duct measured 3-5 mm. Etiology is uncertain. Choledocholithiasis is not excluded.   Electronically Signed   By: Jeb Levering M.D.   On: 09/15/2014 05:58    Review of Systems  Unable to perform ROS  Blood pressure 115/66, pulse 109, temperature 99.9 F (37.7 C), temperature source Axillary, resp. rate 27, weight 60.5 kg (133 lb 6.1 oz), SpO2 100 %. Physical Exam  Constitutional: She appears well-developed and well-nourished. No distress.  Cardiovascular: Normal rate, regular rhythm, normal heart sounds and intact distal pulses.  Exam reveals no gallop and no friction rub.   No murmur heard. Respiratory: Effort normal and breath sounds normal. No respiratory distress. She has no wheezes. She has no rales. She exhibits no tenderness.  GI: Soft. Bowel sounds are normal. She exhibits no distension and no mass. There is no tenderness. There is no rebound and no guarding.  RUQ perc chole drain with light green bilious output.  Musculoskeletal: She exhibits no edema.  Neurological: She is alert.  Follows commands, but aphasic.    Skin: Skin is warm and dry. She is not diaphoretic.  Psychiatric: She has a normal mood and affect.    Assessment/Plan: Hx CVA with left hemiparesis DM II HTN Hypothyroidism Recent admission for Sepsis from UTI/cholecystitis  Hx seizure disorder  Acute cholecystitis s/p percutaneous cholecystostomy tube--Dr. Arnoldo Morale ? Has the tube been flushed while she has been at the SNF.  I did note some bilious output in the drain which is light in color.  The nurse has not flushed it today.  The patient is non tender on exam. [place] notes CBD dilatation, but has normal LFTs.  Nevertheless, the patient will get getting a cholangiogram and a CT of abdomen and pelvis.  If cholangiogram is  positive, then GI will need to be consulted.  IR is following.  No further recs at this time.  Will follow along.  Thank you for the consult.    Eyva Califano ANP-BC 09/15/2014, 10:21 AM

## 2014-09-15 NOTE — Progress Notes (Signed)
Utilization Review Completed.  

## 2014-09-15 NOTE — Evaluation (Signed)
Speech Language Pathology Evaluation Patient Details Name: Tina Hunter MRN: [id number] DOB: 06-24-1924 Today's Date: 09/15/2014 Time: 8366-2947 SLP Time Calculation (min) (ACUTE ONLY): 8 min  Problem List:  Patient Active Problem List   Diagnosis Date Noted  . Nausea with vomiting 09/14/2014  . Fever 09/14/2014  . Tachycardia 09/14/2014  . Dysphagia, oropharyngeal 09/03/2014  . Bacteremia due to vancomycin resistant Enterococcus 09/03/2014  . Diabetes mellitus with complication 65/46/5035  . Acute diastolic heart failure 46/56/8127  . Cholecystitis, acute 08/25/2014  . Leukocytosis 08/25/2014  . Acidosis 08/25/2014  . Sepsis secondary to UTI   . Essential hypertension   . Dementia   . Syncope 03/11/2014  . Weakness generalized 03/11/2014  . CVA, old, hemiparesis 03/11/2014  . Seizure disorder 03/11/2014  . Acute renal failure 03/11/2014   Past Medical History:  Past Medical History  Diagnosis Date  . Stroke   . Diabetes mellitus without complication   . Hypertension   . High cholesterol   . Brain tumor (benign)   . Chronic bronchitis   . Seizures   . Bacteremia due to vancomycin resistant Enterococcus 08/25/2014  . Sepsis secondary to UTI 08/25/2014    and acute cholecystitis/VRE bacteremia.  . Cholecystitis, acute 08/25/2014   Past Surgical History:  Past Surgical History  Procedure Laterality Date  . Brain surgery    . Abdominal hysterectomy    . Ct perc cholecystostomy     HPI:  Tina Hunter is a 81 year old female with a history of CVA with left sided hemiparesis, HTN, seizure disorder, anemia of chronic disease, hypothyroidism, DM II who was hospitalized at [facility] 5/16-5/31 with sepsis, E coli UTI, AKI and acute cholecystitis which was treated with a cholecystostomy tube and followed by Dr. Arnoldo Morale. She presented to [facility] with apparent fevers and no output from the cholecystostomy tube   Assessment / Plan / Recommendation Clinical Impression  Pt demonstrates poor  participation in assessment; it is difficult to determine nature of impairment (cognitive vs linguisitic vs behavioral).  Pt does not respond verbally or follow commands. Per staff she does speak and is dysarthric and will follow commands when motivated. Will follow up for further daignostic assessment, will need home health SLP f/u after d/c.     SLP Assessment  Patient needs continued Speech Lanaguage Pathology Services    Follow Up Recommendations  Home health SLP    Frequency and Duration min 2x/week  2 weeks   Pertinent Vitals/Pain Pain Assessment: Faces Faces Pain Scale: No hurt   SLP Goals  Potential to Achieve Goals (ACUTE ONLY): Poor Potential Considerations (ACUTE ONLY): Co-morbidities;Cooperation/participation level;Previous level of function  SLP Evaluation Prior Functioning  Cognitive/Linguistic Baseline: Baseline deficits Baseline deficit details: left CVA, dysarthric per staff, otherwise no historian available Type of Home: House Available Help at Discharge: Family;Available 24 hours/day   Cognition  Overall Cognitive Status: History of cognitive impairments - at baseline Arousal/Alertness: Awake/alert Orientation Level:  (non verbal) Attention:  (UTA, did not respond to verbal tactile cues)    Comprehension  Auditory Comprehension Overall Auditory Comprehension: Impaired Yes/No Questions: Impaired Basic Biographical Questions: 0-25% accurate Commands: Impaired One Step Basic Commands: 0-24% accurate    Expression Verbal Expression Overall Verbal Expression: Impaired Initiation: Impaired Automatic Speech: Name (would not respond verbally)   Oral / Motor Oral Motor/Sensory Function Overall Oral Motor/Sensory Function: Impaired at baseline (would not follow commands, decreased labial symmetry on righ) Labial Symmetry: Abnormal symmetry right Motor Speech Overall Motor Speech: Other (comment) (no verbalizations, reprotedly  dysarthric)   GO    Herbie [place], MA CCC-SLP [phone]  Lynann Beaver 09/15/2014, 2:32 PM

## 2014-09-15 NOTE — Progress Notes (Signed)
PT Cancellation Note  Patient Details Name: Tina Hunter MRN: [id number] DOB: 1924-11-09   Cancelled Treatment:    Reason Eval/Treat Not Completed: Patient at procedure or test/unavailable (Pt in CT.  Nurse unsure if pt can work with PT. )   Dema Severin, [place] 09/15/2014, 11:48 AM [facility] [phone] (pager)

## 2014-09-15 NOTE — Progress Notes (Signed)
Subjective: Fever: resolved,   Leukocytosis: unclear source,  2 CXR at SNF negative.  CXR here pending along with CT scan. and  Tachycardia,  Pulse 100 this am + trop:  No chest pain, no sob.  Anemia:  No brbpr, no black stool Renal insufficiency stable   Objective: Vital signs in last 24 hours: Temp:  [98.6 F (37 C)-99.4 F (37.4 C)] 98.6 F (37 C) (06/06 0400) Pulse Rate:  [92-108] 92 (06/06 0400) Resp:  [18-35] 18 (06/06 0400) BP: (155-167)/(58-67) 167/67 mmHg (06/06 0400) SpO2:  [97 %-100 %] 100 % (06/06 0400) Weight:  [60.5 kg (133 lb 6.1 oz)] 60.5 kg (133 lb 6.1 oz) (06/06 0409) Weight change:  Last BM Date: 09/14/14  Intake/Output from previous day: 06/05 0701 - 06/06 0700 In: 165.5 [I.V.:115.5; IV Piggyback:50] Out: -  Intake/Output this shift:    Heent: anicteric Neck: no jvd Heart: borderline tachycardia s1, s2 Lung: ctab Abd: soft, nt, nd, +bs, percutaneous cholecystostomy Minimal drainage EXt: no c/c/e   Lab Results:  Recent Labs  09/14/14 2021  WBC 26.3*  HGB 9.0*  HCT 26.6*  PLT 169   BMET  Recent Labs  09/14/14 2021  NA 133*  K 4.2  CL 92*  CO2 28  GLUCOSE 191*  BUN 7  CREATININE 1.37*  CALCIUM 8.2*    Studies/Results: US Abdomen Limited Ruq  09/15/2014   CLINICAL DATA:  Nausea, vomiting and fever. Cholecystostomy placement 08/29/2014.  EXAM: US ABDOMEN LIMITED - RIGHT UPPER QUADRANT  COMPARISON:  CT and ultrasound 08/25/2014  FINDINGS: Gallbladder:  Decompressed by cholecystostomy tube. The cholecystostomy tube is questionably identified. There is persistent gallbladder wall thickening of 6 mm.  Common bile duct:  Diameter: 10.1 mm at the porta hepatis, 8.2 mm distally.  Liver:  No focal lesion identified. Within normal limits in parenchymal echogenicity. Limited assessment due to positioning.  Technically limited exam due to patient contracted and cholecystostomy bandages in place.  IMPRESSION: 1. Decompressed gallbladder by  cholecystostomy tube, the tube is tentatively identified sonographically. There is persistent gallbladder wall thickening of 6 mm. Technically limited evaluation of the gallbladder due to overlying dressing. 2. Development of biliary dilatation, common bile duct 10 mm at the porta hepatis, 8 mm distally. This is a change from prior exam, with the common bile duct measured 3-5 mm. Etiology is uncertain. Choledocholithiasis is not excluded.   Electronically Signed   By: Jeb Levering M.D.   On: 09/15/2014 05:58    Medications: I have reviewed the patient's current medications.  Assessment/Plan: Fever unclear source, ? Gallbladder vs ? Occult pneumonia Cont zosyn and levaquin D#2  Blood culture x2 pending ua negative Echo pending CT scan pending  Tachcyardia, improved Trop i +, appreciate cardiology input  Percutaneous cholecystostomy Due to slow drainage, we have ordered dye injection, appreciate IR input  Hx of acute cholecystitis: Surgery consulted, appreciate input Dye study pending CT scan pending NPO for now  Renal insufficiency Check cbc, cmp in am  DVT prophylaxis: scd, lovenox    LOS: 1 day   Jani Gravel 09/15/2014, 9:05 AM

## 2014-09-15 NOTE — Care Management Note (Signed)
Case Management Note  Patient Details  Name: Tina Hunter MRN: [id number] Date of Birth: December 21, 1924  Subjective/Objective:       Pt was transferred to [facility] when discharged on 5/31 but daughters have decided they will take her home with home health services, request referral to [facility] when pt closer to discharge.             Action/Plan: CM will follow and assist with d/c plans.  Expected Discharge Date:                  Expected Discharge Plan:  [name]  In-House Referral:  Clinical Social Work  Discharge planning Services  CM Consult  Post Acute Care Choice:    Choice offered to:     DME Arranged:    DME Agency:     HH Arranged:    [place] Agency:     Status of Service:     Medicare Important Message Given:    Date Medicare IM Given:    Medicare IM give by:    Date Additional Medicare IM Given:    Additional Medicare Important Message give by:     If discussed at [place] of Stay Meetings, dates discussed:    Additional Comments:  [name], RN 09/15/2014, 12:47 PM

## 2014-09-16 ENCOUNTER — Inpatient Hospital Stay (HOSPITAL_COMMUNITY): Payer: Medicare Other

## 2014-09-16 DIAGNOSIS — R778 Other specified abnormalities of plasma proteins: Secondary | ICD-10-CM | POA: Diagnosis present

## 2014-09-16 DIAGNOSIS — R7989 Other specified abnormal findings of blood chemistry: Secondary | ICD-10-CM | POA: Diagnosis present

## 2014-09-16 LAB — COMPREHENSIVE METABOLIC PANEL
ALT: 13 U/L — ABNORMAL LOW (ref 14–54)
AST: 17 U/L (ref 15–41)
Albumin: 2.3 g/dL — ABNORMAL LOW (ref 3.5–5.0)
Alkaline Phosphatase: 61 U/L (ref 38–126)
Anion gap: 11 (ref 5–15)
BILIRUBIN TOTAL: 0.5 mg/dL (ref 0.3–1.2)
BUN: 7 mg/dL (ref 6–20)
CALCIUM: 7.7 mg/dL — AB (ref 8.9–10.3)
CHLORIDE: 93 mmol/L — AB (ref 101–111)
CO2: 31 mmol/L (ref 22–32)
Creatinine, Ser: 1.73 mg/dL — ABNORMAL HIGH (ref 0.44–1.00)
GFR, EST AFRICAN AMERICAN: 29 mL/min — AB (ref >60)
GFR, EST NON AFRICAN AMERICAN: 25 mL/min — AB (ref >60)
Glucose, Bld: 124 mg/dL — ABNORMAL HIGH (ref 65–99)
Potassium: 3.4 mmol/L — ABNORMAL LOW (ref 3.5–5.1)
SODIUM: 135 mmol/L (ref 135–145)
TOTAL PROTEIN: 5.9 g/dL — AB (ref 6.5–8.1)

## 2014-09-16 LAB — CBC WITH DIFFERENTIAL/PLATELET
Basophils Absolute: 0 10*3/uL (ref 0.0–0.1)
Eosinophils Absolute: 0.8 10*3/uL — ABNORMAL HIGH (ref 0.0–0.7)
HEMATOCRIT: 29.8 % — AB (ref 36.0–46.0)
Hemoglobin: 10.1 g/dL — ABNORMAL LOW (ref 12.0–15.0)
LYMPHS ABS: 1.7 10*3/uL (ref 0.7–4.0)
MCH: 30.6 pg (ref 26.0–34.0)
MCHC: 33.9 g/dL (ref 30.0–36.0)
MCV: 90.3 fL (ref 78.0–100.0)
MONO ABS: 2.2 10*3/uL — AB (ref 0.1–1.0)
Neutro Abs: 12.9 10*3/uL — ABNORMAL HIGH (ref 1.7–7.7)
Platelets: 135 10*3/uL — ABNORMAL LOW (ref 150–400)
RBC: 3.3 MIL/uL — ABNORMAL LOW (ref 3.87–5.11)
RDW: 14.3 % (ref 11.5–15.5)
WBC: 17.7 10*3/uL — ABNORMAL HIGH (ref 4.0–10.5)

## 2014-09-16 LAB — GLUCOSE, CAPILLARY
GLUCOSE-CAPILLARY: 165 mg/dL — AB (ref 65–99)
Glucose-Capillary: 113 mg/dL — ABNORMAL HIGH (ref 65–99)

## 2014-09-16 MED ORDER — PRO-STAT SUGAR FREE PO LIQD
30.0000 mL | Freq: Two times a day (BID) | ORAL | Status: DC
  Administered 2014-09-16 – 2014-09-18 (×5): 30 mL via ORAL
  Filled 2014-09-16 (×6): qty 30

## 2014-09-16 MED ORDER — GLUCERNA SHAKE PO LIQD
237.0000 mL | Freq: Three times a day (TID) | ORAL | Status: DC
  Administered 2014-09-16 – 2014-09-18 (×7): 237 mL via ORAL

## 2014-09-16 MED ORDER — RESOURCE THICKENUP CLEAR PO POWD
ORAL | Status: DC | PRN
  Filled 2014-09-16: qty 125

## 2014-09-16 NOTE — Progress Notes (Signed)
Chief Complaint: Perc chole malfunction  Referring Physician(s): Dr Georges Mouse  History of Present Illness: Tina Hunter is a 81 y.o. female   Percutaneous cholecystostomy drain placed in IR 5/20 Was dc'd to SNF Did well until family noted no output from chole drain Sat 6/4 Developed fever; abd pain; leukocytosis Re admitted and imaging reviewed: proper position per CT and [place] Asked to evaluate drain Flushed drain with 20 cc sterile saline 6/6 Flushed well; asp slow 70 cc recorded from drain 6/6 150 cc in drain bag now- greenish bile Afeb; wbc down; resolved abd pain   Past Medical History  Diagnosis Date  . Stroke   . Diabetes mellitus without complication   . Hypertension   . High cholesterol   . Brain tumor (benign)   . Chronic bronchitis   . Seizures   . Bacteremia due to vancomycin resistant Enterococcus 08/25/2014  . Sepsis secondary to UTI 08/25/2014    and acute cholecystitis/VRE bacteremia.  . Cholecystitis, acute 08/25/2014    Past Surgical History  Procedure Laterality Date  . Brain surgery    . Abdominal hysterectomy    . Ct perc cholecystostomy      Allergies: Codeine  Medications: Prior to Admission medications   Medication Sig Start Date End Date Taking? Authorizing Provider  acetaminophen (TYLENOL) 650 MG CR tablet Take 650 mg by mouth every 4 (four) hours as needed for pain.   Yes Historical Provider, MD  albuterol (PROVENTIL) (2.5 MG/3ML) 0.083% nebulizer solution Take 3 mLs (2.5 mg total) by nebulization every 2 (two) hours as needed for wheezing or shortness of breath. 09/09/14  Yes Rexene Alberts, MD  Amino Acids-Protein Hydrolys (FEEDING SUPPLEMENT, PRO-STAT SUGAR FREE 64,) LIQD Take 30 mLs by mouth 2 (two) times daily.   Yes Historical Provider, MD  amLODipine (NORVASC) 10 MG tablet Take 10 mg by mouth daily.   Yes Historical Provider, MD  aspirin (ASPIRIN CHILDRENS) 81 MG chewable tablet Chew 1 tablet (81 mg total) by mouth daily. 09/09/14  Yes  Rexene Alberts, MD  atenolol (TENORMIN) 50 MG tablet Take 50 mg by mouth daily.   Yes Historical Provider, MD  carbamazepine (TEGRETOL) 200 MG tablet Take 200 mg by mouth 3 (three) times daily.   Yes Historical Provider, MD  ceFEPIme 1 g in dextrose 5 % 50 mL Inject 1 g into the vein every 8 (eight) hours. For 7 days   Yes Historical Provider, MD  donepezil (ARICEPT) 5 MG tablet Take 1 tablet (5 mg total) by mouth at bedtime. 09/09/14  Yes Rexene Alberts, MD  Fe Fum-FA-B Cmp-C-Zn-Mg-Mn-Cu (HEMATINIC PLUS COMPLEX) 106-1 MG TABS Take 1 tablet by mouth daily.   Yes Historical Provider, MD  insulin aspart (NOVOLOG) 100 UNIT/ML injection Sliding scale before each meal and bedtime: Blood sugar 70-120 give 0 units. Blood sugar 121-150 give 2 units. Blood sugar 151-200 give 3 units. Blood sugar 200-250 give 5 units. Blood sugar 251-300 give 8 units. Blood sugar 301-350 give 11 units. Blood sugar 351-400 give 15 units. Blood sugar greater than 400 give 15 units and call doctor. 09/09/14  Yes Rexene Alberts, MD  levofloxacin (LEVAQUIN) 500 MG/100ML SOLN Inject 500 mg into the vein daily. First dose was received 09/14/14 @@ 1000, pt is to receive for 7 days   Yes Historical Provider, MD  levothyroxine (SYNTHROID, LEVOTHROID) 50 MCG tablet Take 50 mcg by mouth daily before breakfast.   Yes Historical Provider, MD  linezolid (ZYVOX) 600 MG/300ML IVPB Inject 300 mLs (  600 mg total) into the vein every 12 (twelve) hours. Linezolid should be continued IV and discontinued after the last dose on Friday September 12, 2014. 09/09/14  Yes Rexene Alberts, MD  losartan (COZAAR) 100 MG tablet Take 0.5 tablets (50 mg total) by mouth daily. 09/09/14  Yes Rexene Alberts, MD  meclizine (ANTIVERT) 25 MG tablet Take 25 mg by mouth daily as needed for dizziness.   Yes Historical Provider, MD  memantine (NAMENDA) 10 MG tablet Take 1 tablet (10 mg total) by mouth daily. 09/09/14  Yes Rexene Alberts, MD  Omega-3 Fatty Acids (FISH OIL) 1000 MG CAPS Take 1  capsule by mouth 2 (two) times daily.   Yes Historical Provider, MD  pantoprazole (PROTONIX) 20 MG tablet Take 20 mg by mouth daily.   Yes Historical Provider, MD  piperacillin-tazobactam (ZOSYN) 3.375 (3-0.375) G injection Inject 3.375 g into the muscle every 6 (six) hours. For 7 days, Start date 09/14/14   Yes Historical Provider, MD  rosuvastatin (CRESTOR) 20 MG tablet Take 20 mg by mouth every evening.   Yes Historical Provider, MD  escitalopram (LEXAPRO) 10 MG tablet Take 10 mg by mouth daily.    Historical Provider, MD  HYDROcodone-acetaminophen (NORCO/VICODIN) 5-325 MG per tablet Take 1 tablet by mouth 2 (two) times daily as needed for moderate pain.  07/08/14   Historical Provider, MD     Family History  Problem Relation Age of Onset  . Diabetes Sister     History   Social History  . Marital Status: Widowed    Spouse Name: N/A  . Number of Children: N/A  . Years of Education: N/A   Social History Main Topics  . Smoking status: Never Smoker   . Smokeless tobacco: Not on file  . Alcohol Use: No  . Drug Use: No  . Sexual Activity: Not on file   Other Topics Concern  . None   Social History Narrative    Review of Systems: A 12 point ROS discussed and pertinent positives are indicated in the HPI above.  All other systems are negative.  Review of Systems  Constitutional: Positive for activity change, appetite change and fatigue. Negative for fever.  Gastrointestinal: Negative for abdominal pain.  Neurological: Positive for weakness.    Vital Signs: BP 160/62 mmHg  Pulse 76  Temp(Src) 98.7 F (37.1 C) (Axillary)  Resp 18  Wt 62.1 kg (136 lb 14.5 oz)  SpO2 98%  Physical Exam  Constitutional: She appears well-developed.  Abdominal: Soft. Bowel sounds are normal. She exhibits distension. There is tenderness.  Site of GB drain is clean and dry Output good: 150 cc in bag: greenish bile Flushes easily  Nursing note and vitals reviewed.   Mallampati Score:      Imaging: Ct Abdomen Pelvis Wo Contrast  09/15/2014   CLINICAL DATA:  Fever of unknown origin. Placement of percutaneous cholecystostomy tube on 08/29/2014 to treat cholecystitis.  EXAM: CT CHEST, ABDOMEN AND PELVIS WITHOUT CONTRAST  TECHNIQUE: Multidetector CT imaging of the chest, abdomen and pelvis was performed following the standard protocol without IV contrast.  COMPARISON:  Unenhanced CT of the abdomen and pelvis on 08/25/2014  FINDINGS: CT CHEST FINDINGS  Consolidative changes of the right lower lobe extend into the right middle lobe and are consistent with pneumonia. No pulmonary edema or pleural effusions are identified. No evidence of masses or lymphadenopathy. No airway obstruction is seen. There is a large hiatal hernia.  The heart is enlarged. Calcified plaque is noted in the  distribution of the LAD, left circumflex and right coronary arteries. No pericardial fluid is seen. There is some sclerosis associated with the T6 vertebral body. It is difficult to identify a clear lesion. No associated fracture. No other definite bony lesions are identified.  CT ABDOMEN AND PELVIS FINDINGS  A cholecystostomy tube enters the gallbladder lumen. The gallbladder is completely decompressed. No evidence of biliary ductal dilatation. Unenhanced appearance of the liver, spleen, pancreas, adrenal glands and kidneys are unremarkable. There is a cyst of the lateral and superior right kidney measuring 4.6 cm in greatest diameter and demonstrating internal density consistent with a simple cyst.  Bowel shows no evidence of obstruction or inflammation. No free air, free fluid or abscess is identified. An IVC filter is in place. The bladder is unremarkable. Tiny umbilical hernia contains fat. Degenerative disc disease present at L4-5. Hemangioma is present in the L5 vertebral body.  IMPRESSION: 1. Right lower lobe and middle lobe pneumonia. 2. Large hiatal hernia. 3. Coronary atherosclerosis with 3 vessel distribution of  calcified plaque. 4. Nonspecific sclerosis of the T6 vertebral body. Subtle metastatic involvement cannot be excluded. However, there is no other evidence of bony lesions in the chest, abdomen or pelvis. 5. Decompression of the gallbladder by a properly positioned cholecystostomy tube. No evidence of biliary obstruction. No focal abscess is identified.   Electronically Signed   By: Aletta Edouard M.D.   On: 09/15/2014 11:59   Ct Abdomen Pelvis Wo Contrast  08/25/2014   CLINICAL DATA:  Fever and weakness  EXAM: CT ABDOMEN AND PELVIS WITHOUT CONTRAST  TECHNIQUE: Multidetector CT imaging of the abdomen and pelvis was performed following the standard protocol without IV contrast.  COMPARISON:  None.  FINDINGS: Lung bases are free of acute infiltrate or sizable effusion. A large hiatal hernia is noted.  The liver, spleen, adrenal glands and pancreas are within normal limits. The gallbladder is well distended demonstrates significant wall thickening with pericholecystic inflammatory changes and small dependent gallstones. These changes are suggestive of acute cholecystitis in the appropriate clinical setting. Nonobstructing renal calculi are noted. A large right renal cyst is seen measuring 4.5 cm.  The colon demonstrates diffuse diverticular change. No diverticulitis or abscess is identified. The bladder is well distended. The uterus has been surgically removed. The bony structures are within normal limits. There are changes consistent with an IVC filter.  IMPRESSION: Changes suggestive of acute cholecystitis with gallstones. Ultrasound may be helpful for further evaluation. Correlation with physical exam is recommended as well.  Right renal cyst.  No other focal abnormality is seen.   Electronically Signed   By: Inez Catalina M.D.   On: 08/25/2014 12:54   Dg Chest 1 View  09/15/2014   CLINICAL DATA:  Fever.  EXAM: CHEST  1 VIEW  COMPARISON:  09/04/2014  FINDINGS: Increased density and consolidative changes at the  right lung base are suspicious for right lower lung pneumonia. Mild atelectasis present at the left lung base. No overt edema or pleural effusions.  IMPRESSION: Findings suspicious for right lower lung pneumonia.   Electronically Signed   By: Aletta Edouard M.D.   On: 09/15/2014 12:09   Dg Chest 1 View  08/25/2014   CLINICAL DATA:  Weakness  EXAM: CHEST  1 VIEW  COMPARISON:  03/11/2014  FINDINGS: Cardiac shadow is mildly enlarged but stable. Diffuse interstitial changes are again identified and stable from the prior exam. Multiple old left rib fractures are again seen. The previously noted hiatal hernia is less  well appreciated on the current exam.  IMPRESSION: No acute abnormality noted.   Electronically Signed   By: Inez Catalina M.D.   On: 08/25/2014 11:41   Ct Head Wo Contrast  09/05/2014   CLINICAL DATA:  Dysarthria  EXAM: CT HEAD WITHOUT CONTRAST  TECHNIQUE: Contiguous axial images were obtained from the base of the skull through the vertex without intravenous contrast.  COMPARISON:  08/25/2014  FINDINGS: Bony calvarium again demonstrates postoperative change. Diffuse atrophic changes are noted. No findings to suggest acute hemorrhage, acute infarction or space-occupying mass lesion are. Changes of prior infarct in the right middle cerebral artery distribution is noted. Scattered white matter ischemic changes noted as well.  IMPRESSION: No change from the prior exam. Chronic atrophic and ischemic changes are again seen.   Electronically Signed   By: Inez Catalina M.D.   On: 09/05/2014 20:11   Ct Head Wo Contrast  08/25/2014   CLINICAL DATA:  Recent change in mental status with weakness  EXAM: CT HEAD WITHOUT CONTRAST  TECHNIQUE: Contiguous axial images were obtained from the base of the skull through the vertex without intravenous contrast.  COMPARISON:  03/11/2014  FINDINGS: Bony calvarium again demonstrates postoperative changes on the right. Diffuse atrophic changes are noted. Some areas of  encephalomalacia related to the previous surgery are noted. Chronic white matter ischemic changes noted as well. No findings to suggest acute hemorrhage, acute infarction or space-occupying mass lesion are noted. An old lacunar infarct is noted within the basal ganglia on the left.  IMPRESSION: Chronic atrophic and ischemic changes.  No acute abnormality noted.   Electronically Signed   By: Inez Catalina M.D.   On: 08/25/2014 11:34   Ct Chest Wo Contrast  09/15/2014   CLINICAL DATA:  Fever of unknown origin. Placement of percutaneous cholecystostomy tube on 08/29/2014 to treat cholecystitis.  EXAM: CT CHEST, ABDOMEN AND PELVIS WITHOUT CONTRAST  TECHNIQUE: Multidetector CT imaging of the chest, abdomen and pelvis was performed following the standard protocol without IV contrast.  COMPARISON:  Unenhanced CT of the abdomen and pelvis on 08/25/2014  FINDINGS: CT CHEST FINDINGS  Consolidative changes of the right lower lobe extend into the right middle lobe and are consistent with pneumonia. No pulmonary edema or pleural effusions are identified. No evidence of masses or lymphadenopathy. No airway obstruction is seen. There is a large hiatal hernia.  The heart is enlarged. Calcified plaque is noted in the distribution of the LAD, left circumflex and right coronary arteries. No pericardial fluid is seen. There is some sclerosis associated with the T6 vertebral body. It is difficult to identify a clear lesion. No associated fracture. No other definite bony lesions are identified.  CT ABDOMEN AND PELVIS FINDINGS  A cholecystostomy tube enters the gallbladder lumen. The gallbladder is completely decompressed. No evidence of biliary ductal dilatation. Unenhanced appearance of the liver, spleen, pancreas, adrenal glands and kidneys are unremarkable. There is a cyst of the lateral and superior right kidney measuring 4.6 cm in greatest diameter and demonstrating internal density consistent with a simple cyst.  Bowel shows no  evidence of obstruction or inflammation. No free air, free fluid or abscess is identified. An IVC filter is in place. The bladder is unremarkable. Tiny umbilical hernia contains fat. Degenerative disc disease present at L4-5. Hemangioma is present in the L5 vertebral body.  IMPRESSION: 1. Right lower lobe and middle lobe pneumonia. 2. Large hiatal hernia. 3. Coronary atherosclerosis with 3 vessel distribution of calcified plaque. 4. Nonspecific sclerosis  of the T6 vertebral body. Subtle metastatic involvement cannot be excluded. However, there is no other evidence of bony lesions in the chest, abdomen or pelvis. 5. Decompression of the gallbladder by a properly positioned cholecystostomy tube. No evidence of biliary obstruction. No focal abscess is identified.   Electronically Signed   By: Aletta Edouard M.D.   On: 09/15/2014 11:59   US Abdomen Complete  08/25/2014   CLINICAL DATA:  81 year old female with a history of fever and weakness. CT of the same date demonstrates changes of potential acute cholecystitis.  EXAM: ULTRASOUND ABDOMEN COMPLETE  COMPARISON:  CT 08/25/2014  FINDINGS: Gallbladder: Gallbladder distended with thickened wall measuring greater than 5 mm. Echogenic material at the fundus of the gallbladder layer dependently. No pericholecystic fluid. Sonographic Percell Miller sign is reported negative.  Common bile duct: Diameter: 3.1 mm  Liver: No focal lesion identified. Within normal limits in parenchymal echogenicity.  IVC: No abnormality visualized.  Pancreas: Visualized portion unremarkable.  Spleen: Size and appearance within normal limits.  Right Kidney: Length: 10.2 cm. Echogenicity of the right kidney similar to that of the adjacent liver. No hydronephrosis. Anechoic cystic structure measuring 5.2 cm x 4.4 cm x 5.0 cm, compatible with Bosniak 1 cyst. Nonobstructive stone which is present on the comparison CT is not visualized  Left Kidney: Length: 8.0 cm. Echogenicity of the left kidney similar to  that of the adjacent spleen. No evidence of hydronephrosis.  Abdominal aorta: No aneurysm visualized.  Other findings: None.  IMPRESSION: Sonographic findings of the gallbladder are suspicious for acute cholecystitis, however are equivocal, as the sonographic Percell Miller sign is reportedly negative. There is suspicious gallbladder wall thickening and echogenic debris/stones within the gallbladder. If there is need to evaluate for ductal obstruction, nuclear medicine HIDA study would be recommended.  Signed,  Dulcy Fanny. Earleen Newport, DO  [facility]  [facility]   Electronically Signed   By: Corrie Mckusick D.O.   On: 08/25/2014 15:33   Nm Hepato W/eject Fract  08/28/2014   CLINICAL DATA:  RIGHT upper quadrant abdominal pain, abnormal gallbladder by ultrasound showing gallstones, distension and thickened wall  EXAM: NUCLEAR MEDICINE HEPATOBILIARY IMAGING  TECHNIQUE: Sequential images of the abdomen were obtained out to 60 minutes following intravenous administration of radiopharmaceutical.  RADIOPHARMACEUTICALS:  5.4 mCi Tc-82m Choletec IV  Pharmaceutical:  2.6 mg morphine sulfate IV  COMPARISON:  Ultrasound abdomen 08/25/2014  FINDINGS: Normal tracer extraction by bloodstream.  Prolonged hepatocellular retention of tracer indicating a degree of hepatocellular dysfunction.  Excretion of tracer into the biliary tree is identified with bowel visualized by 18 minutes.  At 36 minutes gallbladder had not visualized.  Patient then received morphine IV and imaging was continued for an additional 30 minutes.  Gallbladder failed to visualized following morphine augmentation.  IMPRESSION: Patent common hepatic duct and common bile duct with passage of tracer into small bowel.  Hepatocellular dysfunction evidenced by prolonged hepatic retention of tracer.  Nonvisualization of the gallbladder despite morphine augmentation consistent with acute cholecystitis; please note that false-positive  exams can occur however in patients with prolonged fasting.   Electronically Signed   By: Lavonia Dana M.D.   On: 08/28/2014 16:51   Nm Pulmonary Perf And Vent  09/15/2014   CLINICAL DATA:  Positive D-dimer.  EXAM: NUCLEAR MEDICINE VENTILATION - PERFUSION LUNG SCAN  TECHNIQUE: Ventilation images were obtained in multiple projections using inhaled aerosol Tc-78m DTPA. Perfusion images were obtained in multiple projections after intravenous injection of Tc-18m MAA.  RADIOPHARMACEUTICALS:  40 mCi Technetium-17m DTPA aerosol inhalation and 6 mCi Technetium-77m MAA IV  COMPARISON:  Chest x-ray 09/15/2014  FINDINGS: Ventilation: No focal ventilation defect.  Perfusion: No wedge shaped peripheral perfusion defects to suggest acute pulmonary embolism.  IMPRESSION: No evidence of pulmonary embolus.   Electronically Signed   By: Rolm Baptise M.D.   On: 09/15/2014 12:58   Ir Perc Cholecystostomy  08/29/2014   CLINICAL DATA:  81 year old with sepsis and acute cholecystitis.  EXAM: ULTRASOUND AND FLUOROSCOPIC GUIDED PERCUTANEOUS CHOLECYSTOSTOMY TUBE PLACEMENT  Physician: Stephan Minister. Henn, MD  FLUOROSCOPY TIME:  54 seconds, 16 mGy  MEDICATIONS: 0.5 mg Versed.  25 mcg fentanyl  ANESTHESIA/SEDATION: 11 minutes  PROCEDURE: Informed consent was obtained from the patient's daughter. Patient was placed supine on the interventional table. Patient's head was slightly elevated in order to improve breathing. Ultrasound was performed of the right upper quadrant. The right upper abdomen was prepped and draped in sterile fashion. Maximal barrier sterile technique was utilized including caps, mask, sterile gowns, sterile gloves, sterile drape, hand hygiene and skin antiseptic. Skin was anesthetized with 1% lidocaine. A 22 gauge Chiba needle was directed into the gallbladder through the right hepatic lobe with ultrasound guidance. Needle position was confirmed within the gallbladder. Yellow-green purulent fluid was aspirated. A 0.018 wire was  placed. Accustick dilator set was placed. The tract was dilated to accommodate a 10.2 [place] multipurpose drain. Approximately 30 mL of thick yellow purulent fluid was aspirated. Catheter was sutured to the skin. Ultrasound of the right upper quadrant demonstrated decompression of the gallbladder. Catheter was sutured to the skin and attached to gravity bag. Fluoroscopic and ultrasound images were taken and saved for documentation. Fluid was sent for culture.  FINDINGS: There was a thickening gallbladder with echogenic stones or sludge. Needle position confirmed within the gallbladder. Approximately 30 mL of purulent fluid was removed from the gallbladder. The gallbladder was decompressed at the end of the procedure.  Estimated blood loss: Minimal  COMPLICATIONS: None  IMPRESSION: Successful ultrasound and fluoroscopic guided cholecystostomy tube placement.   Electronically Signed   By: Markus Daft M.D.   On: 08/29/2014 18:31   Dg Chest Port 1 View  09/04/2014   CLINICAL DATA:  Acute heart failure. Renal failure, sepsis, urinary tract infection and cholecystitis. History of diabetes, hypertension, stroke and brain tumor.  EXAM: PORTABLE CHEST - 1 VIEW  COMPARISON:  08/28/2014 and 08/25/2014.  FINDINGS: 1425 hours. Patient is rotated to the right. Right arm PICC appears unchanged at the lower SVC level. Allowing for the rotation, the heart size and mediastinal contours are stable. There are persistent low lung volumes with bibasilar atelectasis and possible mild edema. No confluent airspace opacity or significant pleural effusion identified.  IMPRESSION: No significant change in bibasilar atelectasis and possible mild edema.   Electronically Signed   By: Richardean Sale M.D.   On: 09/04/2014 14:51   Dg Chest Port 1 View  08/28/2014   CLINICAL DATA:  Shortness of breath, urinary tract infection and sepsis, previous CVA, chronic bronchitis.  EXAM: PORTABLE CHEST - 1 VIEW  COMPARISON:  Portable chest x-ray of Aug 25, 2014  FINDINGS: The lungs are mildly hypoinflated. The pulmonary interstitial markings are more conspicuous today especially at the right lung base. The cardiac silhouette remains enlarged. The central pulmonary vascularity is engorged. The bony thorax exhibits no acute abnormality.  IMPRESSION: Mild hypo inflation which accentuates the lung markings. However, low-grade CHF is suspected. Interval development of subsegmental atelectasis  at the right lung base is suspected. If the patient can tolerate the procedure, a PA and lateral chest x-ray would be useful.   Electronically Signed   By: David  Martinique M.D.   On: 08/28/2014 12:03   US Abdomen Limited Ruq  09/15/2014   CLINICAL DATA:  Nausea, vomiting and fever. Cholecystostomy placement 08/29/2014.  EXAM: US ABDOMEN LIMITED - RIGHT UPPER QUADRANT  COMPARISON:  CT and ultrasound 08/25/2014  FINDINGS: Gallbladder:  Decompressed by cholecystostomy tube. The cholecystostomy tube is questionably identified. There is persistent gallbladder wall thickening of 6 mm.  Common bile duct:  Diameter: 10.1 mm at the porta hepatis, 8.2 mm distally.  Liver:  No focal lesion identified. Within normal limits in parenchymal echogenicity. Limited assessment due to positioning.  Technically limited exam due to patient contracted and cholecystostomy bandages in place.  IMPRESSION: 1. Decompressed gallbladder by cholecystostomy tube, the tube is tentatively identified sonographically. There is persistent gallbladder wall thickening of 6 mm. Technically limited evaluation of the gallbladder due to overlying dressing. 2. Development of biliary dilatation, common bile duct 10 mm at the porta hepatis, 8 mm distally. This is a change from prior exam, with the common bile duct measured 3-5 mm. Etiology is uncertain. Choledocholithiasis is not excluded.   Electronically Signed   By: Jeb Levering M.D.   On: 09/15/2014 05:58    Labs:  CBC:  Recent Labs  09/14/14 2021  09/15/14 0714 09/15/14 1051 09/16/14 0545  WBC 26.3* 19.2* 18.9* 17.7*  HGB 9.0* 7.2* 7.7* 10.1*  HCT 26.6* 21.7* 22.9* 29.8*  PLT 169 155 152 135*    COAGS:  Recent Labs  08/25/14 2000 09/14/14 2021  INR 1.41 1.21  APTT 32  --     BMP:  Recent Labs  09/09/14 0651 09/14/14 2021 09/15/14 0714 09/16/14 0545  NA 142 133* 132* 135  K 4.2 4.2 3.8 3.4*  CL 101 92* 91* 93*  CO2 31 28 32 31  GLUCOSE 123* 191* 134* 124*  BUN 8 7 7 7   CALCIUM 7.9* 8.2* 8.1* 7.7*  CREATININE 1.31* 1.37* 1.40* 1.73*  GFRNONAA 35* 34* 33* 25*  GFRAA 41* 39* 38* 29*    LIVER FUNCTION TESTS:  Recent Labs  09/08/14 0611 09/14/14 2021 09/15/14 0714 09/16/14 0545  BILITOT 0.3 QUANTITY NOT SUFFICIENT, UNABLE TO PERFORM TEST 0.4 0.5  AST 20 26 22 17   ALT 14 16 14  13*  ALKPHOS 69 70 69 61  PROT 5.5* 6.6 6.1* 5.9*  ALBUMIN 2.4* 2.6* 2.5* 2.3*    TUMOR MARKERS: No results for input(s): AFPTM, CEA, CA199, CHROMGRNA in the last 8760 hours.  Assessment and Plan:  Chole drain intact Good position per imaging Flushing easily Output great  Thank you for this interesting consult.  I greatly enjoyed meeting Linsie Lupo and look forward to participating in their care.  Signed: Grethel Zenk A 09/16/2014, 7:09 AM   I spent a total of 20 Minutes    in face to face in clinical consultation, greater than 50% of which was counseling/coordinating care for chole drain

## 2014-09-16 NOTE — Progress Notes (Signed)
Subjective:  Denies SSCP, palpitations or Dyspnea   Objective:  Filed Vitals:   09/16/14 0500 09/16/14 0513 09/16/14 0600 09/16/14 0820  BP: 121/45 133/53 160/62 169/69  Pulse: 74  76 77  Temp:  98.7 F (37.1 C)  99.8 F (37.7 C)  TempSrc:  Axillary  Axillary  Resp: 5  18 17   Weight: 62.1 kg (136 lb 14.5 oz)     SpO2: 98%  98% 98%    Intake/Output from previous day:  Intake/Output Summary (Last 24 hours) at 09/16/14 2549 Last data filed at 09/16/14 0513  Gross per 24 hour  Intake   1323 ml  Output     70 ml  Net   1253 ml    Physical Exam: Affect appropriate Obese black female  HEENT: normal Neck supple with no adenopathy JVP normal no bruits no thyromegaly Lungs clear with no wheezing and good diaphragmatic motion Heart:  S1/S2 no murmur, no rub, gallop or click PMI normal Abdomen: benighn, BS positve, no tenderness, no AAA no bruit.  No HSM or HJR Distal pulses intact with no bruits No edema Neuro non-focal Skin warm and dry No muscular weakness   Lab Results: Basic Metabolic Panel:  Recent Labs  09/15/14 0714 09/16/14 0545  NA 132* 135  K 3.8 3.4*  CL 91* 93*  CO2 32 31  GLUCOSE 134* 124*  BUN 7 7  CREATININE 1.40* 1.73*  CALCIUM 8.1* 7.7*   Liver Function Tests:  Recent Labs  09/15/14 0714 09/16/14 0545  AST 22 17  ALT 14 13*  ALKPHOS 69 61  BILITOT 0.4 0.5  PROT 6.1* 5.9*  ALBUMIN 2.5* 2.3*   CBC:  Recent Labs  09/15/14 0714 09/15/14 1051 09/16/14 0545  WBC 19.2* 18.9* 17.7*  NEUTROABS 15.3*  --  12.9*  HGB 7.2* 7.7* 10.1*  HCT 21.7* 22.9* 29.8*  MCV 92.3 91.6 90.3  PLT 155 152 135*   Cardiac Enzymes:  Recent Labs  09/14/14 2021 09/15/14 0100 09/15/14 0714  CKTOTAL  --  73  --   CKMB  --  7.5*  --   TROPONINI 0.19* 0.18* 0.13*   BNP: Invalid input(s): POCBNP D-Dimer:  Recent Labs  09/14/14 2021  DDIMER 3.44*   Thyroid Function Tests:  Recent Labs  09/14/14 2012  TSH 2.862    Imaging: Ct  Abdomen Pelvis Wo Contrast  09/15/2014   CLINICAL DATA:  Fever of unknown origin. Placement of percutaneous cholecystostomy tube on 08/29/2014 to treat cholecystitis.  EXAM: CT CHEST, ABDOMEN AND PELVIS WITHOUT CONTRAST  TECHNIQUE: Multidetector CT imaging of the chest, abdomen and pelvis was performed following the standard protocol without IV contrast.  COMPARISON:  Unenhanced CT of the abdomen and pelvis on 08/25/2014  FINDINGS: CT CHEST FINDINGS  Consolidative changes of the right lower lobe extend into the right middle lobe and are consistent with pneumonia. No pulmonary edema or pleural effusions are identified. No evidence of masses or lymphadenopathy. No airway obstruction is seen. There is a large hiatal hernia.  The heart is enlarged. Calcified plaque is noted in the distribution of the LAD, left circumflex and right coronary arteries. No pericardial fluid is seen. There is some sclerosis associated with the T6 vertebral body. It is difficult to identify a clear lesion. No associated fracture. No other definite bony lesions are identified.  CT ABDOMEN AND PELVIS FINDINGS  A cholecystostomy tube enters the gallbladder lumen. The gallbladder is completely decompressed. No evidence of biliary ductal dilatation. Unenhanced appearance  of the liver, spleen, pancreas, adrenal glands and kidneys are unremarkable. There is a cyst of the lateral and superior right kidney measuring 4.6 cm in greatest diameter and demonstrating internal density consistent with a simple cyst.  Bowel shows no evidence of obstruction or inflammation. No free air, free fluid or abscess is identified. An IVC filter is in place. The bladder is unremarkable. Tiny umbilical hernia contains fat. Degenerative disc disease present at L4-5. Hemangioma is present in the L5 vertebral body.  IMPRESSION: 1. Right lower lobe and middle lobe pneumonia. 2. Large hiatal hernia. 3. Coronary atherosclerosis with 3 vessel distribution of calcified plaque.  4. Nonspecific sclerosis of the T6 vertebral body. Subtle metastatic involvement cannot be excluded. However, there is no other evidence of bony lesions in the chest, abdomen or pelvis. 5. Decompression of the gallbladder by a properly positioned cholecystostomy tube. No evidence of biliary obstruction. No focal abscess is identified.   Electronically Signed   By: Aletta Edouard M.D.   On: 09/15/2014 11:59   Dg Chest 1 View  09/15/2014   CLINICAL DATA:  Fever.  EXAM: CHEST  1 VIEW  COMPARISON:  09/04/2014  FINDINGS: Increased density and consolidative changes at the right lung base are suspicious for right lower lung pneumonia. Mild atelectasis present at the left lung base. No overt edema or pleural effusions.  IMPRESSION: Findings suspicious for right lower lung pneumonia.   Electronically Signed   By: Aletta Edouard M.D.   On: 09/15/2014 12:09   Ct Chest Wo Contrast  09/15/2014   CLINICAL DATA:  Fever of unknown origin. Placement of percutaneous cholecystostomy tube on 08/29/2014 to treat cholecystitis.  EXAM: CT CHEST, ABDOMEN AND PELVIS WITHOUT CONTRAST  TECHNIQUE: Multidetector CT imaging of the chest, abdomen and pelvis was performed following the standard protocol without IV contrast.  COMPARISON:  Unenhanced CT of the abdomen and pelvis on 08/25/2014  FINDINGS: CT CHEST FINDINGS  Consolidative changes of the right lower lobe extend into the right middle lobe and are consistent with pneumonia. No pulmonary edema or pleural effusions are identified. No evidence of masses or lymphadenopathy. No airway obstruction is seen. There is a large hiatal hernia.  The heart is enlarged. Calcified plaque is noted in the distribution of the LAD, left circumflex and right coronary arteries. No pericardial fluid is seen. There is some sclerosis associated with the T6 vertebral body. It is difficult to identify a clear lesion. No associated fracture. No other definite bony lesions are identified.  CT ABDOMEN AND PELVIS  FINDINGS  A cholecystostomy tube enters the gallbladder lumen. The gallbladder is completely decompressed. No evidence of biliary ductal dilatation. Unenhanced appearance of the liver, spleen, pancreas, adrenal glands and kidneys are unremarkable. There is a cyst of the lateral and superior right kidney measuring 4.6 cm in greatest diameter and demonstrating internal density consistent with a simple cyst.  Bowel shows no evidence of obstruction or inflammation. No free air, free fluid or abscess is identified. An IVC filter is in place. The bladder is unremarkable. Tiny umbilical hernia contains fat. Degenerative disc disease present at L4-5. Hemangioma is present in the L5 vertebral body.  IMPRESSION: 1. Right lower lobe and middle lobe pneumonia. 2. Large hiatal hernia. 3. Coronary atherosclerosis with 3 vessel distribution of calcified plaque. 4. Nonspecific sclerosis of the T6 vertebral body. Subtle metastatic involvement cannot be excluded. However, there is no other evidence of bony lesions in the chest, abdomen or pelvis. 5. Decompression of the gallbladder by a  properly positioned cholecystostomy tube. No evidence of biliary obstruction. No focal abscess is identified.   Electronically Signed   By: Aletta Edouard M.D.   On: 09/15/2014 11:59   Nm Pulmonary Perf And Vent  09/15/2014   CLINICAL DATA:  Positive D-dimer.  EXAM: NUCLEAR MEDICINE VENTILATION - PERFUSION LUNG SCAN  TECHNIQUE: Ventilation images were obtained in multiple projections using inhaled aerosol Tc-56m DTPA. Perfusion images were obtained in multiple projections after intravenous injection of Tc-65m MAA.  RADIOPHARMACEUTICALS:  40 mCi Technetium-72m DTPA aerosol inhalation and 6 mCi Technetium-25m MAA IV  COMPARISON:  Chest x-ray 09/15/2014  FINDINGS: Ventilation: No focal ventilation defect.  Perfusion: No wedge shaped peripheral perfusion defects to suggest acute pulmonary embolism.  IMPRESSION: No evidence of pulmonary embolus.    Electronically Signed   By: Rolm Baptise M.D.   On: 09/15/2014 12:58   US Abdomen Limited Ruq  09/15/2014   CLINICAL DATA:  Nausea, vomiting and fever. Cholecystostomy placement 08/29/2014.  EXAM: US ABDOMEN LIMITED - RIGHT UPPER QUADRANT  COMPARISON:  CT and ultrasound 08/25/2014  FINDINGS: Gallbladder:  Decompressed by cholecystostomy tube. The cholecystostomy tube is questionably identified. There is persistent gallbladder wall thickening of 6 mm.  Common bile duct:  Diameter: 10.1 mm at the porta hepatis, 8.2 mm distally.  Liver:  No focal lesion identified. Within normal limits in parenchymal echogenicity. Limited assessment due to positioning.  Technically limited exam due to patient contracted and cholecystostomy bandages in place.  IMPRESSION: 1. Decompressed gallbladder by cholecystostomy tube, the tube is tentatively identified sonographically. There is persistent gallbladder wall thickening of 6 mm. Technically limited evaluation of the gallbladder due to overlying dressing. 2. Development of biliary dilatation, common bile duct 10 mm at the porta hepatis, 8 mm distally. This is a change from prior exam, with the common bile duct measured 3-5 mm. Etiology is uncertain. Choledocholithiasis is not excluded.   Electronically Signed   By: Jeb Levering M.D.   On: 09/15/2014 05:58    Cardiac Studies:  ECG:    Telemetry:  NSR 09/16/2014   Echo: Reviewed 08/30/14  EF 65%   Medications:   . sodium chloride   Intravenous Once  . amLODipine  10 mg Oral Daily  . aspirin  81 mg Oral Daily  . atenolol  12.5 mg Oral QPM  . atenolol  50 mg Oral Daily  . carbamazepine  200 mg Oral TID  . enoxaparin (LOVENOX) injection  30 mg Subcutaneous Q24H  . escitalopram  10 mg Oral Daily  . feeding supplement (GLUCERNA SHAKE)  237 mL Oral TID BM  . feeding supplement (PRO-STAT SUGAR FREE 64)  30 mL Oral BID  . insulin aspart  0-5 Units Subcutaneous QHS  . insulin aspart  0-9 Units Subcutaneous TID WC  .  levothyroxine  50 mcg Oral QAC breakfast  . memantine  10 mg Oral Daily  . pantoprazole  40 mg Oral BID  . piperacillin-tazobactam (ZOSYN)  IV  3.375 g Intravenous Q8H  . rosuvastatin  20 mg Oral QPM  . sodium chloride  3 mL Intravenous Q12H       Assessment/Plan:  Elevated Troponin:  See note from Dr Burt Knack.  Not trending or significant no chest pain no further cardiac w/u planned or indicated HTN:  Well controlled.  Continue current medications and low sodium Dash type diet.   ID:  On zosyn afebrile Chol:  On statin no clear indication for this at her age   Discussed care with daughter  Will sign off  Jenkins Rouge 09/16/2014, 9:24 AM

## 2014-09-16 NOTE — Evaluation (Addendum)
Clinical/Bedside Swallow Evaluation Patient Details  Name: Tina Hunter MRN: [id number] Date of Birth: 06-Apr-1925  Today's Date: 09/16/2014 Time: SLP Start Time (ACUTE ONLY): 36 SLP Stop Time (ACUTE ONLY): 1115 SLP Time Calculation (min) (ACUTE ONLY): 23 min  Past Medical History:  Past Medical History  Diagnosis Date  . Stroke   . Diabetes mellitus without complication   . Hypertension   . High cholesterol   . Brain tumor (benign)   . Chronic bronchitis   . Seizures   . Bacteremia due to vancomycin resistant Enterococcus 08/25/2014  . Sepsis secondary to UTI 08/25/2014    and acute cholecystitis/VRE bacteremia.  . Cholecystitis, acute 08/25/2014   Past Surgical History:  Past Surgical History  Procedure Laterality Date  . Brain surgery    . Abdominal hysterectomy    . Ct perc cholecystostomy     HPI:  81 year old female with a history of CVA with left sided hemiparesis, HTN, seizure disorder, anemia of chronic disease, hypothyroidism, DM II who was hospitalized at [facility] 5/16-5/31 with sepsis, E coli UTI, AKI and acute cholecystitis which was treated with a cholecystostomy tube and followed by Dr. Arnoldo Morale.Pt presented to [facility] with apparent fevers and no output from the cholecystostomy tube and found to have pna (CXR Right lower lobe and middle lobe pneumonia, large hiatal hernia). BSE 5/24 at [facility] recommended NPO due to decrease alertness. Alertness improved over subsequent sessions and SLP able to recommend Dys 1, nectar thick liquids on 5/26. Initial order written for SLE only this admission at [facility] and family notified RN of thickened liquids prior to this admission, generating order for BSE.  Assessment / Plan / Recommendation Clinical Impression  Pt alert during assessment with delayed responses, requiring repetition and extra time to respond. Family suspects pt has had a recent stroke; CT negative and per family report obtaining MRI may likely not alter treatment plan.  Mildly pooled secretions noted in anterior buccal sulci. Tactile hand over hand assist needed during self feeding, mild-moderately delayed maniplulation and oral transit. Suspect delayed initiation of swallow and residue as she swallowed x 2 on ocassion. Recomemnd nectar thick liquids, continue Dys 1 texture, crush pills, full feeding assist and no straws. Will plan on MBS next date.       Aspiration Risk  Moderate    Diet Recommendation Dysphagia 1 (Puree);Nectar   Medication Administration: Crushed with puree Compensations: Slow rate;Small sips/bites;Check for pocketing    Other  Recommendations Oral Care Recommendations: Oral care BID Other Recommendations: Order thickener from pharmacy   Follow Up Recommendations       Frequency and Duration min 2x/week  2 weeks   Pertinent Vitals/Pain none         Swallow Study          Oral/Motor/Sensory Function Overall Oral Motor/Sensory Function: Impaired at baseline (decreased lingual ROM on left, weakness)   Ice Chips Ice chips: Not tested   Thin Liquid Thin Liquid: Not tested (family reports thick liquids prior)    Nectar Thick Nectar Thick Liquid: Impaired Presentation: Cup Oral Phase Impairments: Reduced lingual movement/coordination Oral phase functional implications: Prolonged oral transit Pharyngeal Phase Impairments: Suspected delayed Swallow   Honey Thick Honey Thick Liquid: Not tested   Puree Puree: Impaired Pharyngeal Phase Impairments: Suspected delayed Swallow   Solid   GO    Solid: Not tested (on purees prior)       Houston Siren 09/16/2014,11:50 AM   Orbie Pyo Colvin Caroli.Ed [employer] [phone]

## 2014-09-16 NOTE — Progress Notes (Signed)
Speech Language Pathology Treatment: Cognitive-Linquistic  Patient Details Name: Tina Hunter MRN: [id number] DOB: [birth year] Today's Date: 09/16/2014 Time: 4098-1191 SLP Time Calculation (min) (ACUTE ONLY): 13 min  Assessment / Plan / Recommendation Clinical Impression  Pt more alert today with 2 daughters at bedside seen for swallow (see prior progress note) and linguistic-cognitive treatment. Family reports at baseline pt is somewhat more communicative and would respond to questions more frequently and timely compared to present. She required extra time, repetition of questions/requests during session, however followed simple one step commands during eating activity with 70% given extra time and repetitions. Pt did not initiate conversation with this SLP. Will continue tx including family education (suspect not too far from baseline).   HPI Other Pertinent Information: 81 year old female with a history of CVA with left sided hemiparesis (18 yrs ago), HTN, seizure disorder, anemia of chronic disease, hypothyroidism, DM II who was hospitalized at [facility] 5/16-5/31 with sepsis, E coli UTI, AKI and acute cholecystitis which was treated with a cholecystostomy tube and followed by Dr. Arnoldo Morale.Pt presented to [facility] with apparent fevers and no output from the cholecystostomy tube and found to have pna (CXR Right lower lobe and middle lobe pneumonia, large hiatal hernia). BSE 5/24 recommended NPO due to decrease alertness. Alertness improved over subsequent sessions and SLP able to recommend Dys 1, nectar thick liquids on 5/26. Initial order written for SLE only and family notified RN of thickened liquids prior to this admission.   Pertinent Vitals Pain Assessment: No/denies pain  SLP Plan  Continue with current plan of care    Recommendations Medication Administration: Crushed with puree Compensations: Slow rate;Small sips/bites;Check for pocketing              Oral Care Recommendations: Oral care  BID Follow up Recommendations:  (TBD) Plan: Continue with current plan of care    GO     Houston Siren 09/16/2014, 11:55 AM  Orbie Pyo Colvin Caroli[employer] [phone]

## 2014-09-16 NOTE — Progress Notes (Signed)
Nutrition Consult/Follow-Up  DOCUMENTATION CODES:  Not applicable  INTERVENTION:  Glucerna Shake po TID, each supplement provides 220 kcal and 10 grams of protein  Prostat liquid protein po 30 ml BID, each supplement provides 100 kcal, 15 grams protein  NEW NUTRITION DIAGNOSIS:  Predicted suboptimal nutrient intake related to dysphagia as evidenced by  (health hx), ongoing  GOAL:  Patient will meet greater than or equal to 90% of their needs, progressing  MONITOR:  PO intake, Supplement acceptance, Labs, Weight trends, I & O's  ASSESSMENT: 81 yo Female with DM Type II, HTN, hyperlipidemia, CVA with hemiparesis,acute cholecystitis, s/p percutaneous cholecystostomy; sent to hospital and admitted for evaluation of fever as well as lack of output from cholecystostomy.  Pt's diet advanced to Heart Healthy 6/6.  Pt seen per Speech Path during previous hospital admission in May 2016.  SLP recommending Dys 1, nectar thick liquid diet.  Glucerna Shake and liquid protein (Prostat) ordered.  RD spoke with Dr. Maudie Mercury regarding changing diet type.  Telephone with readback orders received.  Unable to complete Nutrition Focused Physical Exam at this time.  Height:  Ht Readings from Last 1 Encounters:  08/25/14 5\' 4"  (1.626 m)    Weight:  Wt Readings from Last 1 Encounters:  09/16/14 136 lb 14.5 oz (62.1 kg)    Ideal Body Weight:  55 kg  Wt Readings from Last 10 Encounters:  09/16/14 136 lb 14.5 oz (62.1 kg)  09/09/14 130 lb 12.8 oz (59.33 kg)  03/12/14 143 lb 4.8 oz (65 kg)    BMI:  Body mass index is 23.49 kg/(m^2).  Estimated Nutritional Needs:  Kcal:  1600-1800  Protein:  70-80 gm  Fluid:  1.6-1.8 L  Skin:  Reviewed, no issues  Diet Order:  DIET - DYS 1 Room service appropriate?: Yes; Fluid consistency:: Nectar Thick  EDUCATION NEEDS:  No education needs identified at this time   Intake/Output Summary (Last 24 hours) at 09/16/14 1101 Last data filed at  09/16/14 0513  Gross per 24 hour  Intake   1323 ml  Output     70 ml  Net   1253 ml    Last BM:  6/5  Arthur Holms, RD, LDN Pager #: [phone] After-Hours Pager #: [phone]

## 2014-09-16 NOTE — Progress Notes (Signed)
Subjective: Pt with no acute changes. JP drain draining well  Objective: Vital signs in last 24 hours: Temp:  [98.6 F (37 C)-99.9 F (37.7 C)] 98.7 F (37.1 C) (06/07 0513) Pulse Rate:  [71-109] 74 (06/07 0412) Resp:  [17-27] 17 (06/07 0412) BP: (108-161)/(38-93) 133/53 mmHg (06/07 0513) SpO2:  [97 %-99 %] 99 % (06/07 0400) Weight:  [62.1 kg (136 lb 14.5 oz)] 62.1 kg (136 lb 14.5 oz) (06/07 0500) Last BM Date: 09/14/14  Intake/Output from previous day: 06/06 0701 - 06/07 0700 In: 1223 [I.V.:503; Blood:670; IV Piggyback:50] Out: 70 [Drains:70] Intake/Output this shift: Total I/O In: 1173 [I.V.:503; Blood:670] Out: -   General appearance: alert and cooperative GI: soft, non-tender; bowel sounds normal; no masses,  no organomegaly and JP with bilious output  Lab Results:   Recent Labs  09/15/14 0714 09/15/14 1051  WBC 19.2* 18.9*  HGB 7.2* 7.7*  HCT 21.7* 22.9*  PLT 155 152   BMET  Recent Labs  09/14/14 2021 09/15/14 0714  NA 133* 132*  K 4.2 3.8  CL 92* 91*  CO2 28 32  GLUCOSE 191* 134*  BUN 7 7  CREATININE 1.37* 1.40*  CALCIUM 8.2* 8.1*   PT/INR  Recent Labs  09/14/14 2021  LABPROT 15.4*  INR 1.21   ABG No results for input(s): PHART, HCO3 in the last 72 hours.  Invalid input(s): PCO2, PO2  Studies/Results: Ct Abdomen Pelvis Wo Contrast  09/15/2014   CLINICAL DATA:  Fever of unknown origin. Placement of percutaneous cholecystostomy tube on 08/29/2014 to treat cholecystitis.  EXAM: CT CHEST, ABDOMEN AND PELVIS WITHOUT CONTRAST  TECHNIQUE: Multidetector CT imaging of the chest, abdomen and pelvis was performed following the standard protocol without IV contrast.  COMPARISON:  Unenhanced CT of the abdomen and pelvis on 08/25/2014  FINDINGS: CT CHEST FINDINGS  Consolidative changes of the right lower lobe extend into the right middle lobe and are consistent with pneumonia. No pulmonary edema or pleural effusions are identified. No evidence of  masses or lymphadenopathy. No airway obstruction is seen. There is a large hiatal hernia.  The heart is enlarged. Calcified plaque is noted in the distribution of the LAD, left circumflex and right coronary arteries. No pericardial fluid is seen. There is some sclerosis associated with the T6 vertebral body. It is difficult to identify a clear lesion. No associated fracture. No other definite bony lesions are identified.  CT ABDOMEN AND PELVIS FINDINGS  A cholecystostomy tube enters the gallbladder lumen. The gallbladder is completely decompressed. No evidence of biliary ductal dilatation. Unenhanced appearance of the liver, spleen, pancreas, adrenal glands and kidneys are unremarkable. There is a cyst of the lateral and superior right kidney measuring 4.6 cm in greatest diameter and demonstrating internal density consistent with a simple cyst.  Bowel shows no evidence of obstruction or inflammation. No free air, free fluid or abscess is identified. An IVC filter is in place. The bladder is unremarkable. Tiny umbilical hernia contains fat. Degenerative disc disease present at L4-5. Hemangioma is present in the L5 vertebral body.  IMPRESSION: 1. Right lower lobe and middle lobe pneumonia. 2. Large hiatal hernia. 3. Coronary atherosclerosis with 3 vessel distribution of calcified plaque. 4. Nonspecific sclerosis of the T6 vertebral body. Subtle metastatic involvement cannot be excluded. However, there is no other evidence of bony lesions in the chest, abdomen or pelvis. 5. Decompression of the gallbladder by a properly positioned cholecystostomy tube. No evidence of biliary obstruction. No focal abscess is identified.   Electronically  Signed   By: Aletta Edouard M.D.   On: 09/15/2014 11:59   Dg Chest 1 View  09/15/2014   CLINICAL DATA:  Fever.  EXAM: CHEST  1 VIEW  COMPARISON:  09/04/2014  FINDINGS: Increased density and consolidative changes at the right lung base are suspicious for right lower lung pneumonia.  Mild atelectasis present at the left lung base. No overt edema or pleural effusions.  IMPRESSION: Findings suspicious for right lower lung pneumonia.   Electronically Signed   By: Aletta Edouard M.D.   On: 09/15/2014 12:09   Ct Chest Wo Contrast  09/15/2014   CLINICAL DATA:  Fever of unknown origin. Placement of percutaneous cholecystostomy tube on 08/29/2014 to treat cholecystitis.  EXAM: CT CHEST, ABDOMEN AND PELVIS WITHOUT CONTRAST  TECHNIQUE: Multidetector CT imaging of the chest, abdomen and pelvis was performed following the standard protocol without IV contrast.  COMPARISON:  Unenhanced CT of the abdomen and pelvis on 08/25/2014  FINDINGS: CT CHEST FINDINGS  Consolidative changes of the right lower lobe extend into the right middle lobe and are consistent with pneumonia. No pulmonary edema or pleural effusions are identified. No evidence of masses or lymphadenopathy. No airway obstruction is seen. There is a large hiatal hernia.  The heart is enlarged. Calcified plaque is noted in the distribution of the LAD, left circumflex and right coronary arteries. No pericardial fluid is seen. There is some sclerosis associated with the T6 vertebral body. It is difficult to identify a clear lesion. No associated fracture. No other definite bony lesions are identified.  CT ABDOMEN AND PELVIS FINDINGS  A cholecystostomy tube enters the gallbladder lumen. The gallbladder is completely decompressed. No evidence of biliary ductal dilatation. Unenhanced appearance of the liver, spleen, pancreas, adrenal glands and kidneys are unremarkable. There is a cyst of the lateral and superior right kidney measuring 4.6 cm in greatest diameter and demonstrating internal density consistent with a simple cyst.  Bowel shows no evidence of obstruction or inflammation. No free air, free fluid or abscess is identified. An IVC filter is in place. The bladder is unremarkable. Tiny umbilical hernia contains fat. Degenerative disc disease  present at L4-5. Hemangioma is present in the L5 vertebral body.  IMPRESSION: 1. Right lower lobe and middle lobe pneumonia. 2. Large hiatal hernia. 3. Coronary atherosclerosis with 3 vessel distribution of calcified plaque. 4. Nonspecific sclerosis of the T6 vertebral body. Subtle metastatic involvement cannot be excluded. However, there is no other evidence of bony lesions in the chest, abdomen or pelvis. 5. Decompression of the gallbladder by a properly positioned cholecystostomy tube. No evidence of biliary obstruction. No focal abscess is identified.   Electronically Signed   By: Aletta Edouard M.D.   On: 09/15/2014 11:59   Nm Pulmonary Perf And Vent  09/15/2014   CLINICAL DATA:  Positive D-dimer.  EXAM: NUCLEAR MEDICINE VENTILATION - PERFUSION LUNG SCAN  TECHNIQUE: Ventilation images were obtained in multiple projections using inhaled aerosol Tc-67m DTPA. Perfusion images were obtained in multiple projections after intravenous injection of Tc-17m MAA.  RADIOPHARMACEUTICALS:  40 mCi Technetium-19m DTPA aerosol inhalation and 6 mCi Technetium-43m MAA IV  COMPARISON:  Chest x-ray 09/15/2014  FINDINGS: Ventilation: No focal ventilation defect.  Perfusion: No wedge shaped peripheral perfusion defects to suggest acute pulmonary embolism.  IMPRESSION: No evidence of pulmonary embolus.   Electronically Signed   By: Rolm Baptise M.D.   On: 09/15/2014 12:58   US Abdomen Limited Ruq  09/15/2014   CLINICAL DATA:  Nausea, vomiting  and fever. Cholecystostomy placement 08/29/2014.  EXAM: US ABDOMEN LIMITED - RIGHT UPPER QUADRANT  COMPARISON:  CT and ultrasound 08/25/2014  FINDINGS: Gallbladder:  Decompressed by cholecystostomy tube. The cholecystostomy tube is questionably identified. There is persistent gallbladder wall thickening of 6 mm.  Common bile duct:  Diameter: 10.1 mm at the porta hepatis, 8.2 mm distally.  Liver:  No focal lesion identified. Within normal limits in parenchymal echogenicity. Limited assessment  due to positioning.  Technically limited exam due to patient contracted and cholecystostomy bandages in place.  IMPRESSION: 1. Decompressed gallbladder by cholecystostomy tube, the tube is tentatively identified sonographically. There is persistent gallbladder wall thickening of 6 mm. Technically limited evaluation of the gallbladder due to overlying dressing. 2. Development of biliary dilatation, common bile duct 10 mm at the porta hepatis, 8 mm distally. This is a change from prior exam, with the common bile duct measured 3-5 mm. Etiology is uncertain. Choledocholithiasis is not excluded.   Electronically Signed   By: Jeb Levering M.D.   On: 09/15/2014 05:58    Anti-infectives: Anti-infectives    Start     Dose/Rate Route Frequency Ordered Stop   09/15/14 0030  levofloxacin (LEVAQUIN) IVPB 500 mg     500 mg 100 mL/hr over 60 Minutes Intravenous  Once 09/15/14 0028 09/15/14 0220   09/14/14 2000  piperacillin-tazobactam (ZOSYN) IVPB 3.375 g     3.375 g 12.5 mL/hr over 240 Minutes Intravenous Every 8 hours 09/14/14 1916     09/14/14 1915  piperacillin-tazobactam (ZOSYN) IVPB 3.375 g  Status:  Discontinued     3.375 g 12.5 mL/hr over 240 Minutes Intravenous 4 times per day 09/14/14 1913 09/14/14 1915      Assessment/Plan: Assessment/Plan: Hx CVA with left hemiparesis DM II HTN Hypothyroidism Recent admission for Sepsis from UTI/cholecystitis  Hx seizure disorder  Cholecystostomy tube appears to be draining well.  Pt with no abd pain and normal LFTS.  Pt has IR cholangiogram via C-tube ordered  There appears to be no acute surgical needs at this time.    LOS: 2 days    Rosario Jacks., [facility] 09/16/2014

## 2014-09-16 NOTE — Progress Notes (Signed)
Pt is from [facility] in [place]- family is planning on patient not returning to SNF but pt will go home with home health services.  RNCM aware.  CSW signing off.  Domenica Reamer, [place] Social Worker [phone]

## 2014-09-16 NOTE — Progress Notes (Signed)
    Continue perc chole tube to drainage. POs as tolerated. Follow up with Dr. Arnoldo Morale.  Surgery signing off. Please call [facility] for further assistance.   Nelson Noone, ANP-BC

## 2014-09-16 NOTE — Progress Notes (Addendum)
Subjective: Pneumonia:  Pt appears more comfortable this am, denies sob.  Fever: resolved,  Tachycardia resolved + trop: secondary to demand ischemia, appreciate cardiology input.  No chest pain, no sob.  Anemia: No brbpr, no black stool. Hgb was 7.2 and transfusion performed, tolerating Renal insufficiency, slightly worse this am.   Objective: Vital signs in last 24 hours: Temp:  [98.6 F (37 C)-99.9 F (37.7 C)] 98.7 F (37.1 C) (06/07 0513) Pulse Rate:  [70-109] 76 (06/07 0600) Resp:  [5-27] 18 (06/07 0600) BP: (108-161)/(38-93) 160/62 mmHg (06/07 0600) SpO2:  [97 %-99 %] 98 % (06/07 0600) Weight:  [62.1 kg (136 lb 14.5 oz)] 62.1 kg (136 lb 14.5 oz) (06/07 0500) Weight change: 1.6 kg (3 lb 8.4 oz) Last BM Date: 09/14/14  Intake/Output from previous day: 06/06 0701 - 06/07 0700 In: 1323 [I.V.:503; Blood:670; IV Piggyback:150] Out: 70 [Drains:70] Intake/Output this shift:    Heent: anicteric Neck: no jvd Heart: rrr s1, s2, Lung: ctab Abd: soft, nt, nd, +bs Ext: no c/c/e  Lab Results:  Recent Labs  09/15/14 1051 09/16/14 0545  WBC 18.9* 17.7*  HGB 7.7* 10.1*  HCT 22.9* 29.8*  PLT 152 135*   BMET  Recent Labs  09/15/14 0714 09/16/14 0545  NA 132* 135  K 3.8 3.4*  CL 91* 93*  CO2 32 31  GLUCOSE 134* 124*  BUN 7 7  CREATININE 1.40* 1.73*  CALCIUM 8.1* 7.7*    Studies/Results: Ct Abdomen Pelvis Wo Contrast  09/15/2014   CLINICAL DATA:  Fever of unknown origin. Placement of percutaneous cholecystostomy tube on 08/29/2014 to treat cholecystitis.  EXAM: CT CHEST, ABDOMEN AND PELVIS WITHOUT CONTRAST  TECHNIQUE: Multidetector CT imaging of the chest, abdomen and pelvis was performed following the standard protocol without IV contrast.  COMPARISON:  Unenhanced CT of the abdomen and pelvis on 08/25/2014  FINDINGS: CT CHEST FINDINGS  Consolidative changes of the right lower lobe extend into the right middle lobe and are consistent with pneumonia. No pulmonary  edema or pleural effusions are identified. No evidence of masses or lymphadenopathy. No airway obstruction is seen. There is a large hiatal hernia.  The heart is enlarged. Calcified plaque is noted in the distribution of the LAD, left circumflex and right coronary arteries. No pericardial fluid is seen. There is some sclerosis associated with the T6 vertebral body. It is difficult to identify a clear lesion. No associated fracture. No other definite bony lesions are identified.  CT ABDOMEN AND PELVIS FINDINGS  A cholecystostomy tube enters the gallbladder lumen. The gallbladder is completely decompressed. No evidence of biliary ductal dilatation. Unenhanced appearance of the liver, spleen, pancreas, adrenal glands and kidneys are unremarkable. There is a cyst of the lateral and superior right kidney measuring 4.6 cm in greatest diameter and demonstrating internal density consistent with a simple cyst.  Bowel shows no evidence of obstruction or inflammation. No free air, free fluid or abscess is identified. An IVC filter is in place. The bladder is unremarkable. Tiny umbilical hernia contains fat. Degenerative disc disease present at L4-5. Hemangioma is present in the L5 vertebral body.  IMPRESSION: 1. Right lower lobe and middle lobe pneumonia. 2. Large hiatal hernia. 3. Coronary atherosclerosis with 3 vessel distribution of calcified plaque. 4. Nonspecific sclerosis of the T6 vertebral body. Subtle metastatic involvement cannot be excluded. However, there is no other evidence of bony lesions in the chest, abdomen or pelvis. 5. Decompression of the gallbladder by a properly positioned cholecystostomy tube. No evidence of biliary  obstruction. No focal abscess is identified.   Electronically Signed   By: Aletta Edouard M.D.   On: 09/15/2014 11:59   Dg Chest 1 View  09/15/2014   CLINICAL DATA:  Fever.  EXAM: CHEST  1 VIEW  COMPARISON:  09/04/2014  FINDINGS: Increased density and consolidative changes at the right  lung base are suspicious for right lower lung pneumonia. Mild atelectasis present at the left lung base. No overt edema or pleural effusions.  IMPRESSION: Findings suspicious for right lower lung pneumonia.   Electronically Signed   By: Aletta Edouard M.D.   On: 09/15/2014 12:09   Ct Chest Wo Contrast  09/15/2014   CLINICAL DATA:  Fever of unknown origin. Placement of percutaneous cholecystostomy tube on 08/29/2014 to treat cholecystitis.  EXAM: CT CHEST, ABDOMEN AND PELVIS WITHOUT CONTRAST  TECHNIQUE: Multidetector CT imaging of the chest, abdomen and pelvis was performed following the standard protocol without IV contrast.  COMPARISON:  Unenhanced CT of the abdomen and pelvis on 08/25/2014  FINDINGS: CT CHEST FINDINGS  Consolidative changes of the right lower lobe extend into the right middle lobe and are consistent with pneumonia. No pulmonary edema or pleural effusions are identified. No evidence of masses or lymphadenopathy. No airway obstruction is seen. There is a large hiatal hernia.  The heart is enlarged. Calcified plaque is noted in the distribution of the LAD, left circumflex and right coronary arteries. No pericardial fluid is seen. There is some sclerosis associated with the T6 vertebral body. It is difficult to identify a clear lesion. No associated fracture. No other definite bony lesions are identified.  CT ABDOMEN AND PELVIS FINDINGS  A cholecystostomy tube enters the gallbladder lumen. The gallbladder is completely decompressed. No evidence of biliary ductal dilatation. Unenhanced appearance of the liver, spleen, pancreas, adrenal glands and kidneys are unremarkable. There is a cyst of the lateral and superior right kidney measuring 4.6 cm in greatest diameter and demonstrating internal density consistent with a simple cyst.  Bowel shows no evidence of obstruction or inflammation. No free air, free fluid or abscess is identified. An IVC filter is in place. The bladder is unremarkable. Tiny  umbilical hernia contains fat. Degenerative disc disease present at L4-5. Hemangioma is present in the L5 vertebral body.  IMPRESSION: 1. Right lower lobe and middle lobe pneumonia. 2. Large hiatal hernia. 3. Coronary atherosclerosis with 3 vessel distribution of calcified plaque. 4. Nonspecific sclerosis of the T6 vertebral body. Subtle metastatic involvement cannot be excluded. However, there is no other evidence of bony lesions in the chest, abdomen or pelvis. 5. Decompression of the gallbladder by a properly positioned cholecystostomy tube. No evidence of biliary obstruction. No focal abscess is identified.   Electronically Signed   By: Aletta Edouard M.D.   On: 09/15/2014 11:59   Nm Pulmonary Perf And Vent  09/15/2014   CLINICAL DATA:  Positive D-dimer.  EXAM: NUCLEAR MEDICINE VENTILATION - PERFUSION LUNG SCAN  TECHNIQUE: Ventilation images were obtained in multiple projections using inhaled aerosol Tc-32m DTPA. Perfusion images were obtained in multiple projections after intravenous injection of Tc-43m MAA.  RADIOPHARMACEUTICALS:  40 mCi Technetium-31m DTPA aerosol inhalation and 6 mCi Technetium-20m MAA IV  COMPARISON:  Chest x-ray 09/15/2014  FINDINGS: Ventilation: No focal ventilation defect.  Perfusion: No wedge shaped peripheral perfusion defects to suggest acute pulmonary embolism.  IMPRESSION: No evidence of pulmonary embolus.   Electronically Signed   By: Rolm Baptise M.D.   On: 09/15/2014 12:58   US Abdomen Limited Ruq  09/15/2014   CLINICAL DATA:  Nausea, vomiting and fever. Cholecystostomy placement 08/29/2014.  EXAM: US ABDOMEN LIMITED - RIGHT UPPER QUADRANT  COMPARISON:  CT and ultrasound 08/25/2014  FINDINGS: Gallbladder:  Decompressed by cholecystostomy tube. The cholecystostomy tube is questionably identified. There is persistent gallbladder wall thickening of 6 mm.  Common bile duct:  Diameter: 10.1 mm at the porta hepatis, 8.2 mm distally.  Liver:  No focal lesion identified. Within  normal limits in parenchymal echogenicity. Limited assessment due to positioning.  Technically limited exam due to patient contracted and cholecystostomy bandages in place.  IMPRESSION: 1. Decompressed gallbladder by cholecystostomy tube, the tube is tentatively identified sonographically. There is persistent gallbladder wall thickening of 6 mm. Technically limited evaluation of the gallbladder due to overlying dressing. 2. Development of biliary dilatation, common bile duct 10 mm at the porta hepatis, 8 mm distally. This is a change from prior exam, with the common bile duct measured 3-5 mm. Etiology is uncertain. Choledocholithiasis is not excluded.   Electronically Signed   By: Jeb Levering M.D.   On: 09/15/2014 05:58    Medications: I have reviewed the patient's current medications.  Assessment/Plan: Fever seconadry to pneumonia Cont Zosyn and levaquin D#3, will not add vanco due to renal insufficiency and fact that pt is clinically improving Blood culture x2 pending, no growth so far  Tachcyardia, improved Trop i +, appreciate cardiology input  Percutaneous cholecystostomy Good drainage this am,  Hold off on dye injection study , appreciate IR input  Hx of acute cholecystitis: Surgery consulted, appreciate input,  Agree fever more likely to be from pneumonia,   Protein calorie malnutrition Nutrition consulted Start on prostat, and also glucerna  Renal insufficiency Check cbc, cmp in am Stop losartan today  Anemia Pt is finishing transfusion   DVT prophylaxis: scd, lovenox  LOS: 2 days   Jani Gravel 09/16/2014, 7:47 AM

## 2014-09-17 ENCOUNTER — Inpatient Hospital Stay (HOSPITAL_COMMUNITY): Payer: Medicare Other

## 2014-09-17 LAB — TYPE AND SCREEN
ABO/RH(D): A POS
ANTIBODY SCREEN: NEGATIVE
UNIT DIVISION: 0
UNIT DIVISION: 0
UNIT DIVISION: 0
Unit division: 0
Unit division: 0
Unit division: 0

## 2014-09-17 LAB — COMPREHENSIVE METABOLIC PANEL
ALT: 12 U/L — ABNORMAL LOW (ref 14–54)
ANION GAP: 11 (ref 5–15)
AST: 19 U/L (ref 15–41)
Albumin: 2.3 g/dL — ABNORMAL LOW (ref 3.5–5.0)
Alkaline Phosphatase: 59 U/L (ref 38–126)
BUN: 11 mg/dL (ref 6–20)
CO2: 30 mmol/L (ref 22–32)
Calcium: 8.1 mg/dL — ABNORMAL LOW (ref 8.9–10.3)
Chloride: 96 mmol/L — ABNORMAL LOW (ref 101–111)
Creatinine, Ser: 1.64 mg/dL — ABNORMAL HIGH (ref 0.44–1.00)
GFR calc Af Amer: 31 mL/min — ABNORMAL LOW (ref >60)
GFR, EST NON AFRICAN AMERICAN: 27 mL/min — AB (ref >60)
GLUCOSE: 121 mg/dL — AB (ref 65–99)
POTASSIUM: 3.3 mmol/L — AB (ref 3.5–5.1)
Sodium: 137 mmol/L (ref 135–145)
Total Bilirubin: 0.5 mg/dL (ref 0.3–1.2)
Total Protein: 6.1 g/dL — ABNORMAL LOW (ref 6.5–8.1)

## 2014-09-17 LAB — CBC WITH DIFFERENTIAL/PLATELET
Basophils Absolute: 0.1 10*3/uL (ref 0.0–0.1)
Basophils Relative: 0 % (ref 0–1)
Eosinophils Absolute: 1.1 10*3/uL — ABNORMAL HIGH (ref 0.0–0.7)
Eosinophils Relative: 7 % — ABNORMAL HIGH (ref 0–5)
HEMATOCRIT: 35.7 % — AB (ref 36.0–46.0)
Hemoglobin: 12 g/dL (ref 12.0–15.0)
LYMPHS PCT: 13 % (ref 12–46)
Lymphs Abs: 2 10*3/uL (ref 0.7–4.0)
MCH: 30.2 pg (ref 26.0–34.0)
MCHC: 33.6 g/dL (ref 30.0–36.0)
MCV: 89.9 fL (ref 78.0–100.0)
Monocytes Absolute: 1.7 10*3/uL — ABNORMAL HIGH (ref 0.1–1.0)
Monocytes Relative: 11 % (ref 3–12)
Neutro Abs: 10.6 10*3/uL — ABNORMAL HIGH (ref 1.7–7.7)
Neutrophils Relative %: 69 % (ref 43–77)
PLATELETS: 132 10*3/uL — AB (ref 150–400)
RBC: 3.97 MIL/uL (ref 3.87–5.11)
RDW: 14.7 % (ref 11.5–15.5)
WBC: 15.3 10*3/uL — ABNORMAL HIGH (ref 4.0–10.5)

## 2014-09-17 LAB — GLUCOSE, CAPILLARY
GLUCOSE-CAPILLARY: 128 mg/dL — AB (ref 65–99)
GLUCOSE-CAPILLARY: 153 mg/dL — AB (ref 65–99)
GLUCOSE-CAPILLARY: 163 mg/dL — AB (ref 65–99)
Glucose-Capillary: 126 mg/dL — ABNORMAL HIGH (ref 65–99)
Glucose-Capillary: 180 mg/dL — ABNORMAL HIGH (ref 65–99)

## 2014-09-17 MED ORDER — POTASSIUM CHLORIDE CRYS ER 20 MEQ PO TBCR: 40.0000 meq | EXTENDED_RELEASE_TABLET | Freq: Once | ORAL | Status: DC

## 2014-09-17 MED ORDER — POTASSIUM CHLORIDE 10 MEQ/100ML IV SOLN
10.0000 meq | INTRAVENOUS | Status: AC
  Administered 2014-09-17 (×3): 10 meq via INTRAVENOUS
  Filled 2014-09-17 (×3): qty 100

## 2014-09-17 MED ORDER — HYDRALAZINE HCL 20 MG/ML IJ SOLN: 10.0000 mg | Freq: Four times a day (QID) | INTRAMUSCULAR | Status: DC | PRN

## 2014-09-17 NOTE — Clinical Social Work Note (Addendum)
CSW received phone call from case manager at 3:30 pm that patient's family are in agreement to returning to [facility] in [facility].  CSW did not have time to assess patient will let unit CSW know, patient was faxed out and FL2 on chart.  Jones Broom. [employer], MSW, [place] 09/17/2014 6:30 PM

## 2014-09-17 NOTE — Progress Notes (Addendum)
09/17/2014 Central monitoring called at 1240 patient had 5 beat run of vtach at 1229.  Patient heart rate was in the 160's to 180's  And Dr Maudie Mercury was made aware at 1300. [facility] RN.

## 2014-09-17 NOTE — Progress Notes (Signed)
MBSS complete. Full report located under chart review in imaging section.  Heidee Audi MA, CCC-SLP [phone]   

## 2014-09-18 LAB — COMPREHENSIVE METABOLIC PANEL
ALK PHOS: 59 U/L (ref 38–126)
ALT: 13 U/L — AB (ref 14–54)
AST: 16 U/L (ref 15–41)
Albumin: 2.3 g/dL — ABNORMAL LOW (ref 3.5–5.0)
Anion gap: 9 (ref 5–15)
BILIRUBIN TOTAL: 0.5 mg/dL (ref 0.3–1.2)
BUN: 13 mg/dL (ref 6–20)
CALCIUM: 8.2 mg/dL — AB (ref 8.9–10.3)
CHLORIDE: 99 mmol/L — AB (ref 101–111)
CO2: 31 mmol/L (ref 22–32)
CREATININE: 1.57 mg/dL — AB (ref 0.44–1.00)
GFR, EST AFRICAN AMERICAN: 33 mL/min — AB (ref >60)
GFR, EST NON AFRICAN AMERICAN: 29 mL/min — AB (ref >60)
GLUCOSE: 125 mg/dL — AB (ref 65–99)
Potassium: 3.6 mmol/L (ref 3.5–5.1)
SODIUM: 139 mmol/L (ref 135–145)
TOTAL PROTEIN: 6.2 g/dL — AB (ref 6.5–8.1)

## 2014-09-18 LAB — PATHOLOGIST SMEAR REVIEW

## 2014-09-18 LAB — CBC WITH DIFFERENTIAL/PLATELET
BASOS ABS: 0 10*3/uL (ref 0.0–0.1)
Basophils Relative: 0 % (ref 0–1)
EOS PCT: 11 % — AB (ref 0–5)
Eosinophils Absolute: 1.7 10*3/uL — ABNORMAL HIGH (ref 0.0–0.7)
HEMATOCRIT: 37.1 % (ref 36.0–46.0)
Hemoglobin: 12.5 g/dL (ref 12.0–15.0)
LYMPHS ABS: 1.9 10*3/uL (ref 0.7–4.0)
Lymphocytes Relative: 12 % (ref 12–46)
MCH: 30.9 pg (ref 26.0–34.0)
MCHC: 33.7 g/dL (ref 30.0–36.0)
MCV: 91.6 fL (ref 78.0–100.0)
Monocytes Absolute: 2.1 10*3/uL — ABNORMAL HIGH (ref 0.1–1.0)
Monocytes Relative: 13 % — ABNORMAL HIGH (ref 3–12)
NEUTROS ABS: 10.2 10*3/uL — AB (ref 1.7–7.7)
NEUTROS PCT: 64 % (ref 43–77)
Platelets: 167 10*3/uL (ref 150–400)
RBC: 4.05 MIL/uL (ref 3.87–5.11)
RDW: 14.4 % (ref 11.5–15.5)
WBC: 15.9 10*3/uL — AB (ref 4.0–10.5)

## 2014-09-18 LAB — GLUCOSE, CAPILLARY
GLUCOSE-CAPILLARY: 159 mg/dL — AB (ref 65–99)
Glucose-Capillary: 134 mg/dL — ABNORMAL HIGH (ref 65–99)

## 2014-09-18 MED ORDER — LEVOFLOXACIN 250 MG PO TABS: 250.0000 mg | ORAL_TABLET | ORAL | Status: AC

## 2014-09-18 MED ORDER — PIPERACILLIN-TAZOBACTAM 3.375 G IVPB: 3.3750 g | Freq: Three times a day (TID) | INTRAVENOUS | Status: DC

## 2014-09-18 MED ORDER — INSULIN ASPART 100 UNIT/ML ~~LOC~~ SOLN: 0.0000 [IU] | Freq: Three times a day (TID) | SUBCUTANEOUS | Status: DC

## 2014-09-18 MED ORDER — RESOURCE THICKENUP CLEAR PO POWD: ORAL | Status: DC

## 2014-09-18 MED ORDER — ATENOLOL 25 MG PO TABS: 12.5000 mg | ORAL_TABLET | Freq: Every evening | ORAL | Status: DC

## 2014-09-18 MED ORDER — ROSUVASTATIN CALCIUM 20 MG PO TABS: 10.0000 mg | ORAL_TABLET | Freq: Every evening | ORAL | Status: AC

## 2014-09-18 MED ORDER — GLUCERNA SHAKE PO LIQD: 237.0000 mL | Freq: Three times a day (TID) | ORAL | Status: DC

## 2014-09-18 MED ORDER — INSULIN ASPART 100 UNIT/ML ~~LOC~~ SOLN: 0.0000 [IU] | Freq: Every day | SUBCUTANEOUS | Status: DC

## 2014-09-18 NOTE — Progress Notes (Signed)
Physical Therapy Treatment Patient Details Name: Tina Hunter MRN: [id number] DOB: [birth year] Today's Date: 09/18/2014    History of Present Illness 81 yo female with dm2, htn, hyperlipidemia,CVA with hemiparesis, hx of acute cholecystitis, s/p percutaneous cholecystostomy, pt is resident of SNF, CXR at SNF was negative, ua pending. Pt sent to hospital and admitted for evaluation of fever as well as lack of output from cholecystostmy.     PT Comments    Pt very limited today by lethargy and per family and staff has been lethargic all day. Pt with only periodic eye opening but not sustained and unable to follow commands or participate in therapy at this time. Dgtr present throughout and encouraged mobility as pt able. Will continue to follow.   Follow Up Recommendations  SNF     Equipment Recommendations       Recommendations for Other Services       Precautions / Restrictions Precautions Precautions: Fall Precaution Comments: LUE contracture    Mobility  Bed Mobility Overal bed mobility: Needs Assistance;+2 for physical assistance Bed Mobility: Rolling;Sidelying to Sit;Sit to Supine Rolling: Total assist Sidelying to sit: Total assist;+2 for physical assistance   Sit to supine: Total assist;+2 for physical assistance   General bed mobility comments: pt lethargic on arrival but dgtr very much wanting therapy. Pt with semi sidelying on arrival and with 2 person assist fully rolled to side and up to sitting. pt with max assist for balance due to posterior lean and even EOB 6 min would not open eyes or engage despite multiple attempts of dgtr to arouse pt with pulling and tugging on her. Pt returned to supine with bed placed in chair position  Transfers                 General transfer comment: unable secondary to lethargy  Ambulation/Gait                 Stairs            Wheelchair Mobility    Modified Rankin (Stroke Patients Only)        Balance   Sitting-balance support: Feet unsupported;No upper extremity supported Sitting balance-Leahy Scale: Zero Sitting balance - Comments: 6 min with max assist for balance due to posterior lean                            Cognition Arousal/Alertness: Lethargic   Overall Cognitive Status: Difficult to assess                      Exercises      General Comments        Pertinent Vitals/Pain Pain Assessment:  (CPOT= 0)    Home Living                      Prior Function            PT Goals (current goals can now be found in the care plan section) Progress towards PT goals: Not progressing toward goals - comment    Frequency  Min 2X/week    PT Plan Discharge plan needs to be updated;Frequency needs to be updated    Co-evaluation             End of Session   Activity Tolerance: Patient limited by lethargy Patient left: in bed;with call bell/phone within reach;with nursing/sitter in room;with family/visitor present     Time:  7322-0254 PT Time Calculation (min) (ACUTE ONLY): 10 min  Charges:  $Therapeutic Activity: 8-22 mins                    G Codes:      Melford Aase 2014-10-17, 12:40 PM Elwyn Reach, [place]

## 2014-09-18 NOTE — Progress Notes (Signed)
Patient will discharge to [facility] Anticipated discharge date: 09/18/14 Family notified: pt daughter at bedside Transportation by PTAR-RN to call when patient is ready  CSW signing off.  Domenica Reamer, [place] Social Worker [phone]

## 2014-09-18 NOTE — Discharge Summary (Signed)
Physician Discharge Summary  Patient ID: Tina Hunter MRN: [id number] DOB/AGE: [birth year] 79 y.o.  Admit date: 09/14/2014 Discharge date: 09/18/2014  Admission Diagnoses: Fever Tachycardia N/v + trop Hypokalemia CKD stage 3 Dm2 H/o acute cholecystitis s/p percutaneous cholecystostomy Dementia   Discharge Diagnoses:  Active Problems:   Diabetes mellitus with complication   Nausea with vomiting   Fever   Tachycardia   Elevated troponin Fever secondary to Hcap Tachycardia resolved + trop due to tachycardia, and renal insufficiency Mild Acute on CRF CKD stage 3 H/o Acute cholecystitis s/p percutaneous cholecystostomy Seizure do H/o CVA Dementia  Discharged Condition: stable  Hospital Course: 81 yo female with dementia, CVA, seizure do, ckd stage 3, h/o acute cholecystitis with recent admission s/p percutaneous cholecystostomy, had fever at SNF on 09/13/2014, unclear origin. Pt was noted to be tachycardic, and also to have lack of output from percutaneous cholecystostomy.  Pt was direct admitted to [facility].  Cardiology consulted and didn't think that trop was related to nstemi.  But rather tachycardia, and renal insufficiency.  Surgery was consulted and didn't think that her fever was secondary to cholecystitis.  IR was consulted and after flushing percutaenous cholecystostomy with20 mL of saline it began to function again.  Pt continued to have fever and blood cultures were negative. Pt was already on Zosyn and Levaquin was added.  CT chest showed evidence of pneumonia.  Pt became afebrile after day 1 of levaquin and improved clinically.  She was noted to be anemic on 09/15/2014 and transfused with 3 units of prbc. Pt had 5 beats of ? VT or pvc on 09/17/2014 and was monitored overnite without issues.  Pt feeling well, and eating.  Speech therapy saw the patient and recommended dysphagia 1, nectar thick liquid, pureed diet.  Consults: surgery, IR, speech  Significant Diagnostic  Studies:  CT chest => pneumonia, u/s 09/15/2014, CT abd/pelvis 09/15/2014  Treatments:  Zosyn, levaquin iv   Discharge Exam: Blood pressure 170/72, pulse 70, temperature 98.1 F (36.7 C), temperature source Axillary, resp. rate 15, weight 61.9 kg (136 lb 7.4 oz), SpO2 100 %. Heent: anicteric Neck: no jvd Heart: rrr s1, s2,  Lung: ctab ABd: soft, nt, nd, +bs, perc cholecystostomy draining, bile Ext: no c/c/e Skin: no rash   A/P Fever secondary to hcap Cont zosyn 3.375gm iv q8 x 4 days levaquin 250mg  po qod x4 doses  Tachycardia resolved likely secondary to fever  Leukocytosis improving  Acute on CRF Improving Stopped losartan Check cbc, cmp in 2 days  H/o acute cholecystitis and s/p Percutaneous cholestostomy on prior admission Please arrange f/u with Dr. Arnoldo Morale in 1 week Please flush perc cholecystostomy with 54mL saline qshift And monitor output  Seizure do Cont carbamazepine Check tegretol level in 2 days  Hypertension Stopped losartan due to renal insufficiency Cont amlodipine 10mg  po qday We increased atenolol to 50mg  po qday and 12.5mg  po qpm during this admission.  Please monitor vitals qshift x 1 week Notify NP if sbp >160  Dm2 fsbs ac and qhs, iss as per medication list  Dementia Cont namenda D/c aricept due to possible contribution to nausea  Hx of CHF (diastolic) Not presently on lasix, please check daily weight Please notify NP if wt gain greater than 3lbs over baseine admission weight If renal insufficiency hits baseline creatinine of 1.4  Consider resuming lasix 20mg  po qday  Dispo: SNF today  Disposition: 03-Skilled Nursing Facility     Medication List    STOP taking these medications  ceFEPIme 1 g in dextrose 5 % 50 mL     donepezil 5 MG tablet  Commonly known as:  ARICEPT     Fish Oil 1000 MG Caps     HYDROcodone-acetaminophen 5-325 MG per tablet  Commonly known as:  NORCO/VICODIN     levofloxacin 500 MG/100ML Soln   Commonly known as:  LEVAQUIN     linezolid 600 MG/300ML IVPB  Commonly known as:  ZYVOX     losartan 100 MG tablet  Commonly known as:  COZAAR     piperacillin-tazobactam 3.375 (3-0.375) G injection  Commonly known as:  ZOSYN      TAKE these medications        acetaminophen 650 MG CR tablet  Commonly known as:  TYLENOL  Take 650 mg by mouth every 4 (four) hours as needed for pain.     albuterol (2.5 MG/3ML) 0.083% nebulizer solution  Commonly known as:  PROVENTIL  Take 3 mLs (2.5 mg total) by nebulization every 2 (two) hours as needed for wheezing or shortness of breath.     amLODipine 10 MG tablet  Commonly known as:  NORVASC  Take 10 mg by mouth daily.     aspirin 81 MG chewable tablet  Commonly known as:  ASPIRIN CHILDRENS  Chew 1 tablet (81 mg total) by mouth daily.     atenolol 50 MG tablet  Commonly known as:  TENORMIN  Take 50 mg by mouth daily.     atenolol 25 MG tablet  Commonly known as:  TENORMIN  Take 0.5 tablets (12.5 mg total) by mouth every evening.     carbamazepine 200 MG tablet  Commonly known as:  TEGRETOL  Take 200 mg by mouth 3 (three) times daily.     escitalopram 10 MG tablet  Commonly known as:  LEXAPRO  Take 10 mg by mouth daily.     feeding supplement (GLUCERNA SHAKE) Liqd  Take 237 mLs by mouth 3 (three) times daily between meals.     feeding supplement (PRO-STAT SUGAR FREE 64) Liqd  Take 30 mLs by mouth 2 (two) times daily.     HEMATINIC PLUS COMPLEX 106-1 MG Tabs  Take 1 tablet by mouth daily.     insulin aspart 100 UNIT/ML injection  Commonly known as:  novoLOG  Inject 0-9 Units into the skin 3 (three) times daily with meals.     insulin aspart 100 UNIT/ML injection  Commonly known as:  novoLOG  Inject 0-5 Units into the skin at bedtime.     levofloxacin 250 MG tablet  Commonly known as:  LEVAQUIN  Take 1 tablet (250 mg total) by mouth every other day.     levothyroxine 50 MCG tablet  Commonly known as:  SYNTHROID,  LEVOTHROID  Take 50 mcg by mouth daily before breakfast.     meclizine 25 MG tablet  Commonly known as:  ANTIVERT  Take 25 mg by mouth daily as needed for dizziness.     memantine 10 MG tablet  Commonly known as:  NAMENDA  Take 1 tablet (10 mg total) by mouth daily.     pantoprazole 20 MG tablet  Commonly known as:  PROTONIX  Take 20 mg by mouth daily.     piperacillin-tazobactam 3.375 GM/50ML IVPB  Commonly known as:  ZOSYN  Inject 50 mLs (3.375 g total) into the vein every 8 (eight) hours.     RESOURCE THICKENUP CLEAR Powd  Please use thickit to make nectar thick consistency  rosuvastatin 20 MG tablet  Commonly known as:  CRESTOR  Take 0.5 tablets (10 mg total) by mouth every evening.       Follow-up Information    Follow up with Jamesetta So, MD. Schedule an appointment as soon as possible for a visit in 1 week.   Specialty:  General Surgery   Why:  for percutaneous cholecystostomy, cholecystitis   Contact information:   [address]  [address] [phone]       Signed: Jani Gravel 09/18/2014, 8:09 AM  /

## 2014-09-18 NOTE — Discharge Instructions (Signed)
Please use iv zosyn for 4 more days,  Please make appt to see Dr. Arnoldo Morale, please flush the percutaneous cholecystostomy with 10 mL saline per shift.

## 2014-09-18 NOTE — Care Management Note (Signed)
Case Management Note  Patient Details  Name: Tina Hunter MRN: [id number] Date of Birth: 1924-07-28  Subjective/Objective:     Pt and dtrs have agreed to 5-day stay @ [facility] for IV antibx, dtrs then want to provide 24/7 assistance for pt to return home.  Request referral to [facility] for RN and PT when pt discharges from SNF.  Faxed documents to [facility] as well as to PCP, Dr Keane Police per request.                Expected Discharge Date:                  Expected Discharge Plan:  [place]  In-House Referral:  Clinical Social Work  Discharge planning Services  CM Consult  Post Acute Care Choice:    Choice offered to:     DME Arranged:    DME Agency:     HH Arranged:    [place]:  [facility]  Status of Service:  Completed, signed off  Medicare Important Message Given:  Yes Date Medicare IM Given:  09/18/14 Medicare IM give by:  Babette Relic RN Date Additional Medicare IM Given:    Additional Medicare Important Message give by:     If discussed at [place] of Stay Meetings, dates discussed:    Additional Comments:  [name], RN 09/18/2014, 2:05 PM

## 2014-09-18 NOTE — Progress Notes (Signed)
Already called [facility]  in [place], report given to Lerry Liner RN, called [facility] for transport.

## 2014-09-18 NOTE — Clinical Social Work Note (Signed)
Clinical Social Work Assessment  Patient Details  Name: Tina Hunter MRN: [id number] Date of Birth: 01/08/1925  Date of referral:  09/18/14               Reason for consult:  Facility Placement                Permission sought to share information with:  Family Supports, Chartered certified accountant granted to share information::  Yes, Verbal Permission Granted  Name::        Agency::  [facility]  Relationship::     Contact Information:     Housing/Transportation Living arrangements for the past 2 months:  [place] of Information:  Adult Children Patient Interpreter Needed:  None Criminal Activity/Legal Involvement Pertinent to Current Situation/Hospitalization:  No - Comment as needed Significant Relationships:  Adult Children Lives with:  Adult Children Do you feel safe going back to the place where you live?  Yes Need for family participation in patient care:  Yes (Comment)  Care giving concerns:Pt daughter wanted to take pt home but does not feel comfortable taking pt home with continuing IV antibiotics   Social Worker assessment / plan:  CSW spoke with pt about PT recommendation for returning to SNF  Employment status:  Retired Forensic scientist:  Medicare PT Recommendations:  [place] / Referral to community resources:  [place]  Patient/Family's Response to care:  Pt family is agreeable return to [facility] for short term rehab and management of patients IV medications  Patient/Family's Understanding of and Emotional Response to Diagnosis, Current Treatment, and Prognosis:  unclear  Emotional Assessment Appearance:  Appears stated age Attitude/Demeanor/Rapport:  Unable to Assess Affect (typically observed):  Unable to Assess Orientation:  Oriented to Self Alcohol / Substance use:  Not Applicable Psych involvement (Current and /or in the community):  No (Comment)  Discharge Needs   Concerns to be addressed:  Discharge Planning Concerns Readmission within the last 30 days:  Yes Current discharge risk:  Physical Impairment Barriers to Discharge:  No Barriers Identified   Cranford Mon, LCSW 09/18/2014, 12:34 PM

## 2014-09-21 LAB — CULTURE, BLOOD (ROUTINE X 2)
CULTURE: NO GROWTH
Culture: NO GROWTH

## 2014-09-25 ENCOUNTER — Inpatient Hospital Stay (HOSPITAL_COMMUNITY)
Admission: AD | Admit: 2014-09-25 | Discharge: 2014-09-28 | DRG: 947 | Disposition: A | Discharge status: Skilled Nursing Facility | Payer: Medicare Other | Source: Ambulatory Visit | Attending: Internal Medicine | Admitting: Internal Medicine

## 2014-09-25 ENCOUNTER — Inpatient Hospital Stay (HOSPITAL_COMMUNITY): Payer: Medicare Other

## 2014-09-25 ENCOUNTER — Encounter (HOSPITAL_COMMUNITY): Payer: Self-pay | Admitting: Internal Medicine

## 2014-09-25 DIAGNOSIS — Z79899 Other long term (current) drug therapy: Secondary | ICD-10-CM

## 2014-09-25 DIAGNOSIS — D649 Anemia, unspecified: Secondary | ICD-10-CM | POA: Diagnosis present

## 2014-09-25 DIAGNOSIS — R4182 Altered mental status, unspecified: Principal | ICD-10-CM | POA: Diagnosis present

## 2014-09-25 DIAGNOSIS — Z8673 Personal history of transient ischemic attack (TIA), and cerebral infarction without residual deficits: Secondary | ICD-10-CM

## 2014-09-25 DIAGNOSIS — R700 Elevated erythrocyte sedimentation rate: Secondary | ICD-10-CM | POA: Diagnosis present

## 2014-09-25 DIAGNOSIS — N184 Chronic kidney disease, stage 4 (severe): Secondary | ICD-10-CM | POA: Diagnosis present

## 2014-09-25 DIAGNOSIS — Z7982 Long term (current) use of aspirin: Secondary | ICD-10-CM

## 2014-09-25 DIAGNOSIS — R7881 Bacteremia: Secondary | ICD-10-CM

## 2014-09-25 DIAGNOSIS — Z794 Long term (current) use of insulin: Secondary | ICD-10-CM

## 2014-09-25 DIAGNOSIS — Z1621 Resistance to vancomycin: Secondary | ICD-10-CM

## 2014-09-25 DIAGNOSIS — D72829 Elevated white blood cell count, unspecified: Secondary | ICD-10-CM

## 2014-09-25 DIAGNOSIS — Y95 Nosocomial condition: Secondary | ICD-10-CM | POA: Diagnosis present

## 2014-09-25 DIAGNOSIS — I129 Hypertensive chronic kidney disease with stage 1 through stage 4 chronic kidney disease, or unspecified chronic kidney disease: Secondary | ICD-10-CM | POA: Diagnosis present

## 2014-09-25 DIAGNOSIS — J189 Pneumonia, unspecified organism: Secondary | ICD-10-CM | POA: Diagnosis present

## 2014-09-25 DIAGNOSIS — E119 Type 2 diabetes mellitus without complications: Secondary | ICD-10-CM | POA: Diagnosis present

## 2014-09-25 LAB — COMPREHENSIVE METABOLIC PANEL
ALK PHOS: 52 U/L (ref 38–126)
ALT: 17 U/L (ref 14–54)
AST: 18 U/L (ref 15–41)
Albumin: 2.6 g/dL — ABNORMAL LOW (ref 3.5–5.0)
Anion gap: 13 (ref 5–15)
BUN: 33 mg/dL — ABNORMAL HIGH (ref 6–20)
CALCIUM: 8.5 mg/dL — AB (ref 8.9–10.3)
CO2: 30 mmol/L (ref 22–32)
Chloride: 100 mmol/L — ABNORMAL LOW (ref 101–111)
Creatinine, Ser: 1.97 mg/dL — ABNORMAL HIGH (ref 0.44–1.00)
GFR calc Af Amer: 25 mL/min — ABNORMAL LOW (ref >60)
GFR, EST NON AFRICAN AMERICAN: 22 mL/min — AB (ref >60)
GLUCOSE: 160 mg/dL — AB (ref 65–99)
Potassium: 4.2 mmol/L (ref 3.5–5.1)
SODIUM: 143 mmol/L (ref 135–145)
TOTAL PROTEIN: 6.7 g/dL (ref 6.5–8.1)
Total Bilirubin: 0.2 mg/dL — ABNORMAL LOW (ref 0.3–1.2)

## 2014-09-25 LAB — CBC WITH DIFFERENTIAL/PLATELET
Basophils Absolute: 0.1 10*3/uL (ref 0.0–0.1)
Basophils Relative: 0 % (ref 0–1)
EOS PCT: 4 % (ref 0–5)
Eosinophils Absolute: 0.8 10*3/uL — ABNORMAL HIGH (ref 0.0–0.7)
HEMATOCRIT: 37.4 % (ref 36.0–46.0)
Hemoglobin: 11.8 g/dL — ABNORMAL LOW (ref 12.0–15.0)
LYMPHS ABS: 1.8 10*3/uL (ref 0.7–4.0)
LYMPHS PCT: 10 % — AB (ref 12–46)
MCH: 30.4 pg (ref 26.0–34.0)
MCHC: 31.6 g/dL (ref 30.0–36.0)
MCV: 96.4 fL (ref 78.0–100.0)
MONO ABS: 1 10*3/uL (ref 0.1–1.0)
Monocytes Relative: 6 % (ref 3–12)
NEUTROS ABS: 14.5 10*3/uL — AB (ref 1.7–7.7)
Neutrophils Relative %: 80 % — ABNORMAL HIGH (ref 43–77)
Platelets: 462 10*3/uL — ABNORMAL HIGH (ref 150–400)
RBC: 3.88 MIL/uL (ref 3.87–5.11)
RDW: 14.3 % (ref 11.5–15.5)
WBC: 18.2 10*3/uL — AB (ref 4.0–10.5)

## 2014-09-25 LAB — SEDIMENTATION RATE: Sed Rate: 112 mm/hr — ABNORMAL HIGH (ref 0–22)

## 2014-09-25 LAB — TROPONIN I: Troponin I: <0.03 ng/mL (ref <0.031)

## 2014-09-25 LAB — BRAIN NATRIURETIC PEPTIDE: B Natriuretic Peptide: 47 pg/mL (ref 0.0–100.0)

## 2014-09-25 NOTE — H&P (Signed)
Tina Hunter is an 81 y.o. female.    Tina Hunter  Chief Complaint: altered mental status HPI: 81 yo female with hx of CVA, seizure,  dm2, htn, apparently had slight altered mental status, more somnolent.  Pt also noted to have Elevation in sed rate.  Pt denies headache.  nonfocal neurologically.  Pt will be admitted observation for ams  Past Medical History  Diagnosis Date  . Stroke   . Diabetes mellitus without complication   . Hypertension   . High cholesterol   . Brain tumor (benign)   . Chronic bronchitis   . Seizures   . Bacteremia due to vancomycin resistant Enterococcus 08/25/2014  . Sepsis secondary to UTI 08/25/2014    and acute cholecystitis/VRE bacteremia.  . Cholecystitis, acute 08/25/2014    Past Surgical History  Procedure Laterality Date  . Brain surgery    . Abdominal hysterectomy    . Ct perc cholecystostomy      Family History  Problem Relation Age of Onset  . Diabetes Sister    Social History:  reports that she has never smoked. She does not have any smokeless tobacco history on file. She reports that she does not drink alcohol or use illicit drugs.  Allergies:  Allergies  Allergen Reactions  . Codeine Other (See Comments)    Unknown reaction    Medications Prior to Admission  Medication Sig Dispense Refill  . acetaminophen (TYLENOL) 650 MG CR tablet Take 650 mg by mouth every 4 (four) hours as needed for pain.    Marland Kitchen albuterol (PROVENTIL) (2.5 MG/3ML) 0.083% nebulizer solution Take 3 mLs (2.5 mg total) by nebulization every 2 (two) hours as needed for wheezing or shortness of breath.    . Amino Acids-Protein Hydrolys (FEEDING SUPPLEMENT, PRO-STAT SUGAR FREE 64,) LIQD Take 30 mLs by mouth 2 (two) times daily.    Marland Kitchen amLODipine (NORVASC) 10 MG tablet Take 10 mg by mouth daily.    Marland Kitchen aspirin (ASPIRIN CHILDRENS) 81 MG chewable tablet Chew 1 tablet (81 mg total) by mouth daily.    Marland Kitchen atenolol (TENORMIN) 25 MG tablet Take 0.5 tablets (12.5 mg total) by  mouth every evening. 30 tablet 1  . atenolol (TENORMIN) 50 MG tablet Take 50 mg by mouth daily.    . carbamazepine (TEGRETOL) 200 MG tablet Take 200 mg by mouth 3 (three) times daily.    Marland Kitchen escitalopram (LEXAPRO) 10 MG tablet Take 10 mg by mouth daily.    . Fe Fum-FA-B Cmp-C-Zn-Mg-Mn-Cu (HEMATINIC PLUS COMPLEX) 106-1 MG TABS Take 1 tablet by mouth daily.    . feeding supplement, GLUCERNA SHAKE, (GLUCERNA SHAKE) LIQD Take 237 mLs by mouth 3 (three) times daily between meals. 30 Can 1  . insulin aspart (NOVOLOG) 100 UNIT/ML injection Inject 0-9 Units into the skin 3 (three) times daily with meals. 10 mL 11  . insulin aspart (NOVOLOG) 100 UNIT/ML injection Inject 0-5 Units into the skin at bedtime. 10 mL 11  . levothyroxine (SYNTHROID, LEVOTHROID) 50 MCG tablet Take 50 mcg by mouth daily before breakfast.    . Maltodextrin-Xanthan Gum (RESOURCE THICKENUP CLEAR) POWD Please use thickit to make nectar thick consistency 3 Can 1  . meclizine (ANTIVERT) 25 MG tablet Take 25 mg by mouth daily as needed for dizziness.    . memantine (NAMENDA) 10 MG tablet Take 1 tablet (10 mg total) by mouth daily.    . pantoprazole (PROTONIX) 20 MG tablet Take 20 mg by mouth daily.    . piperacillin-tazobactam (ZOSYN)  3.375 GM/50ML IVPB Inject 50 mLs (3.375 g total) into the vein every 8 (eight) hours. 50 mL 9  . rosuvastatin (CRESTOR) 20 MG tablet Take 0.5 tablets (10 mg total) by mouth every evening. 30 tablet 1    No results found for this or any previous visit (from the past 48 hour(s)). No results found.  Review of Systems  Constitutional: Negative.   HENT: Negative.   Eyes: Negative.   Respiratory: Negative.   Cardiovascular: Negative.   Gastrointestinal: Negative.   Genitourinary: Negative.   Musculoskeletal: Negative.   Skin: Negative.   Neurological: Negative.   Endo/Heme/Allergies: Negative.   Psychiatric/Behavioral: Negative.     There were no vitals taken for this visit. Physical Exam   Constitutional: She is oriented to person, place, and time. She appears well-developed and well-nourished.  HENT:  Head: Normocephalic and atraumatic.  Mouth/Throat: No oropharyngeal exudate.  Eyes: Conjunctivae and EOM are normal. Pupils are equal, round, and reactive to light. No scleral icterus.  Neck: Normal range of motion. Neck supple. No JVD present. No tracheal deviation present. No thyromegaly present.  Cardiovascular: Normal rate and regular rhythm.  Exam reveals no gallop and no friction rub.   No murmur heard. Respiratory: Effort normal and breath sounds normal. No respiratory distress. She has no wheezes. She has no rales.  GI: Soft. Bowel sounds are normal. She exhibits no distension. There is no tenderness. There is no rebound and no guarding.  Musculoskeletal: Normal range of motion. She exhibits no edema or tenderness.  Neurological: She is alert and oriented to person, place, and time. She has normal reflexes. She displays normal reflexes. No cranial nerve deficit. She exhibits normal muscle tone. Coordination normal.  Skin: Skin is warm and dry. No rash noted. No erythema. No pallor.  Psychiatric: She has a normal mood and affect. Her behavior is normal. Thought content normal.     Assessment/Plan AMS: check CT brain, check ua  Leukocytosis Blood cx x2  Echo  Check esr  CKD stage 3/4 Hydrate gently with ns iv  Pneumonia, hcap Cont vanco, zosyn, levaquin iv Appreciate pharmacy input  Anemia Check cbc in am, Spep, upep pending  DVT prophylaxis:  Scd, lovenox  Tina Hunter 09/25/2014, 9:13 PM

## 2014-09-25 NOTE — Progress Notes (Signed)
Subjective: Pneumonia:  Pt denies sob.  Fever resolved Tachycardia resolved.  + trop secondary to demand ischemia,  Cardiology thought that echo not needed.   Anemia pt s/p transfusion,  Doing well, denies brbpr, black stool Renal insufficiency:  stable  Objective: Vital signs in last 24 hours: VSS, afebrile    Weight change:  Last BM Date: 09/17/14  Intake/Output from previous day:   Intake/Output this shift:    Heent: anicteric Neck: no jvd Heart: rrr s1, s2 Lung: ctab Abd: soft, nt, nd, +bs Ext: no c/c/e   Lab Results: No results for input(s): WBC, HGB, HCT, PLT in the last 72 hours. BMET No results for input(s): NA, K, CL, CO2, GLUCOSE, BUN, CREATININE, CALCIUM in the last 72 hours.  Studies/Results: No results found.  Medications: I have reviewed the patients medications.   Assessment/Plan: Fever seconadry to pneumonia  Cont zosyn and levaquin D#4 will not add vanco due to renal insufficiency and fact that pt was improving  Tachycardia Improved Trop +,  Cardiology thought echo not necessary  Percutaneous cholecystostomy Good drainage  Hx of acute cholecystitis, surgery doesn't think that fever related to cholecystitis  Protein Calorie malnutrition Cont prostat  Renal insufficiency Off losartan  Anemia  DVt prophylaxis: scd, lovenox  LOS 3 days  Jani Gravel 09/17/2014 , 8:13 PM

## 2014-09-26 ENCOUNTER — Observation Stay (HOSPITAL_COMMUNITY): Payer: Medicare Other

## 2014-09-26 ENCOUNTER — Other Ambulatory Visit (HOSPITAL_COMMUNITY): Payer: Medicare Other

## 2014-09-26 ENCOUNTER — Observation Stay (HOSPITAL_BASED_OUTPATIENT_CLINIC_OR_DEPARTMENT_OTHER): Payer: Medicare Other

## 2014-09-26 DIAGNOSIS — N184 Chronic kidney disease, stage 4 (severe): Secondary | ICD-10-CM | POA: Diagnosis present

## 2014-09-26 DIAGNOSIS — R509 Fever, unspecified: Secondary | ICD-10-CM | POA: Diagnosis not present

## 2014-09-26 DIAGNOSIS — J189 Pneumonia, unspecified organism: Secondary | ICD-10-CM | POA: Diagnosis present

## 2014-09-26 DIAGNOSIS — Z79899 Other long term (current) drug therapy: Secondary | ICD-10-CM | POA: Diagnosis not present

## 2014-09-26 DIAGNOSIS — R700 Elevated erythrocyte sedimentation rate: Secondary | ICD-10-CM | POA: Diagnosis present

## 2014-09-26 DIAGNOSIS — Z8673 Personal history of transient ischemic attack (TIA), and cerebral infarction without residual deficits: Secondary | ICD-10-CM | POA: Diagnosis not present

## 2014-09-26 DIAGNOSIS — I129 Hypertensive chronic kidney disease with stage 1 through stage 4 chronic kidney disease, or unspecified chronic kidney disease: Secondary | ICD-10-CM | POA: Diagnosis present

## 2014-09-26 DIAGNOSIS — E119 Type 2 diabetes mellitus without complications: Secondary | ICD-10-CM | POA: Diagnosis present

## 2014-09-26 DIAGNOSIS — Y95 Nosocomial condition: Secondary | ICD-10-CM | POA: Diagnosis present

## 2014-09-26 DIAGNOSIS — D649 Anemia, unspecified: Secondary | ICD-10-CM | POA: Diagnosis present

## 2014-09-26 DIAGNOSIS — Z7982 Long term (current) use of aspirin: Secondary | ICD-10-CM | POA: Diagnosis not present

## 2014-09-26 DIAGNOSIS — Z794 Long term (current) use of insulin: Secondary | ICD-10-CM | POA: Diagnosis not present

## 2014-09-26 DIAGNOSIS — R4182 Altered mental status, unspecified: Secondary | ICD-10-CM | POA: Diagnosis present

## 2014-09-26 LAB — URINALYSIS, ROUTINE W REFLEX MICROSCOPIC
BILIRUBIN URINE: NEGATIVE
Glucose, UA: NEGATIVE mg/dL
HGB URINE DIPSTICK: NEGATIVE
Leukocytes, UA: NEGATIVE
Nitrite: NEGATIVE
PH: 5.5 (ref 5.0–8.0)
Protein, ur: 100 mg/dL — AB
Specific Gravity, Urine: 1.025 (ref 1.005–1.030)
Urobilinogen, UA: 0.2 mg/dL (ref 0.0–1.0)

## 2014-09-26 LAB — CARBAMAZEPINE LEVEL, TOTAL: Carbamazepine Lvl: 11 ug/mL (ref 4.0–12.0)

## 2014-09-26 LAB — GLUCOSE, CAPILLARY
GLUCOSE-CAPILLARY: 133 mg/dL — AB (ref 65–99)
GLUCOSE-CAPILLARY: 200 mg/dL — AB (ref 65–99)
Glucose-Capillary: 165 mg/dL — ABNORMAL HIGH (ref 65–99)
Glucose-Capillary: 155 mg/dL — ABNORMAL HIGH (ref 65–99)
Glucose-Capillary: 126 mg/dL — ABNORMAL HIGH (ref 65–99)

## 2014-09-26 LAB — URINE MICROSCOPIC-ADD ON

## 2014-09-26 MED ORDER — ENOXAPARIN SODIUM 30 MG/0.3ML ~~LOC~~ SOLN
30.0000 mg | SUBCUTANEOUS | Status: DC
  Administered 2014-09-26 – 2014-09-28 (×3): 30 mg via SUBCUTANEOUS
  Filled 2014-09-26 (×3): qty 0.3

## 2014-09-26 MED ORDER — LEVOFLOXACIN IN D5W 500 MG/100ML IV SOLN
500.0000 mg | INTRAVENOUS | Status: DC
  Administered 2014-09-27: 500 mg via INTRAVENOUS
  Filled 2014-09-26: qty 100

## 2014-09-26 MED ORDER — PANTOPRAZOLE SODIUM 20 MG PO TBEC
20.0000 mg | DELAYED_RELEASE_TABLET | Freq: Every day | ORAL | Status: DC
  Filled 2014-09-26: qty 1

## 2014-09-26 MED ORDER — SODIUM CHLORIDE 0.9 % IJ SOLN
3.0000 mL | Freq: Two times a day (BID) | INTRAMUSCULAR | Status: DC
  Administered 2014-09-26 (×3): 3 mL via INTRAVENOUS

## 2014-09-26 MED ORDER — ACETAMINOPHEN 325 MG PO TABS: 650.0000 mg | ORAL_TABLET | ORAL | Status: DC | PRN

## 2014-09-26 MED ORDER — GLUCERNA SHAKE PO LIQD
237.0000 mL | Freq: Three times a day (TID) | ORAL | Status: DC
  Administered 2014-09-26 – 2014-09-28 (×3): 237 mL via ORAL

## 2014-09-26 MED ORDER — PIPERACILLIN-TAZOBACTAM 3.375 G IVPB 30 MIN
3.3750 g | Freq: Three times a day (TID) | INTRAVENOUS | Status: DC
  Administered 2014-09-26: 3.375 g via INTRAVENOUS
  Filled 2014-09-26 (×10): qty 50

## 2014-09-26 MED ORDER — ESCITALOPRAM OXALATE 10 MG PO TABS
10.0000 mg | ORAL_TABLET | Freq: Every day | ORAL | Status: DC
  Administered 2014-09-27 – 2014-09-28 (×2): 10 mg via ORAL
  Filled 2014-09-26 (×3): qty 1

## 2014-09-26 MED ORDER — PANTOPRAZOLE SODIUM 40 MG PO TBEC
40.0000 mg | DELAYED_RELEASE_TABLET | Freq: Every day | ORAL | Status: DC
  Administered 2014-09-27 – 2014-09-28 (×2): 40 mg via ORAL
  Filled 2014-09-26 (×3): qty 1

## 2014-09-26 MED ORDER — MEMANTINE HCL 10 MG PO TABS
10.0000 mg | ORAL_TABLET | Freq: Every day | ORAL | Status: DC
  Administered 2014-09-27 – 2014-09-28 (×2): 10 mg via ORAL
  Filled 2014-09-26 (×3): qty 1

## 2014-09-26 MED ORDER — PREDNISONE 20 MG PO TABS
60.0000 mg | ORAL_TABLET | Freq: Every day | ORAL | Status: DC
  Administered 2014-09-26: 60 mg via ORAL
  Filled 2014-09-26 (×2): qty 3

## 2014-09-26 MED ORDER — RESOURCE THICKENUP CLEAR PO POWD
ORAL | Status: DC | PRN
  Filled 2014-09-26: qty 125

## 2014-09-26 MED ORDER — SODIUM CHLORIDE 0.9 % IV SOLN
500.0000 mg | INTRAVENOUS | Status: DC
  Administered 2014-09-27 – 2014-09-28 (×2): 500 mg via INTRAVENOUS
  Filled 2014-09-26 (×3): qty 500

## 2014-09-26 MED ORDER — SODIUM CHLORIDE 0.9 % IJ SOLN: 10.0000 mL | INTRAMUSCULAR | Status: DC | PRN

## 2014-09-26 MED ORDER — SODIUM CHLORIDE 0.9 % IJ SOLN
10.0000 mL | Freq: Two times a day (BID) | INTRAMUSCULAR | Status: DC
Start: 2014-09-26 — End: 2014-09-28
  Administered 2014-09-26 – 2014-09-28 (×5): 10 mL

## 2014-09-26 MED ORDER — SODIUM CHLORIDE 0.9 % IV SOLN
INTRAVENOUS | Status: AC
Start: 2014-09-26 — End: 2014-09-27
  Administered 2014-09-26: 22:00:00 via INTRAVENOUS

## 2014-09-26 MED ORDER — ASPIRIN 81 MG PO CHEW
81.0000 mg | CHEWABLE_TABLET | Freq: Every day | ORAL | Status: DC
  Administered 2014-09-27 – 2014-09-28 (×2): 81 mg via ORAL
  Filled 2014-09-26 (×3): qty 1

## 2014-09-26 MED ORDER — PIPERACILLIN SOD-TAZOBACTAM SO 2.25 (2-0.25) G IV SOLR
2.2500 g | Freq: Three times a day (TID) | INTRAVENOUS | Status: DC
  Administered 2014-09-26 – 2014-09-28 (×6): 2.25 g via INTRAVENOUS
  Filled 2014-09-26 (×9): qty 2.25

## 2014-09-26 MED ORDER — PIPERACILLIN-TAZOBACTAM 3.375 G IVPB
3.3750 g | Freq: Three times a day (TID) | INTRAVENOUS | Status: DC
  Administered 2014-09-26: 3.375 g via INTRAVENOUS

## 2014-09-26 MED ORDER — MECLIZINE HCL 12.5 MG PO TABS: 25.0000 mg | ORAL_TABLET | Freq: Every day | ORAL | Status: DC | PRN

## 2014-09-26 MED ORDER — AMLODIPINE BESYLATE 5 MG PO TABS
10.0000 mg | ORAL_TABLET | Freq: Every day | ORAL | Status: DC
  Administered 2014-09-27 – 2014-09-28 (×2): 10 mg via ORAL
  Filled 2014-09-26 (×3): qty 2

## 2014-09-26 MED ORDER — CARBAMAZEPINE 200 MG PO TABS
200.0000 mg | ORAL_TABLET | Freq: Three times a day (TID) | ORAL | Status: DC
  Administered 2014-09-26 – 2014-09-28 (×7): 200 mg via ORAL
  Filled 2014-09-26 (×8): qty 1

## 2014-09-26 MED ORDER — LEVOTHYROXINE SODIUM 50 MCG PO TABS
50.0000 ug | ORAL_TABLET | Freq: Every day | ORAL | Status: DC
  Administered 2014-09-27 – 2014-09-28 (×2): 50 ug via ORAL
  Filled 2014-09-26 (×3): qty 1

## 2014-09-26 MED ORDER — PNEUMOCOCCAL VAC POLYVALENT 25 MCG/0.5ML IJ INJ
0.5000 mL | INJECTION | INTRAMUSCULAR | Status: DC
  Filled 2014-09-26: qty 0.5

## 2014-09-26 MED ORDER — FE FUMARATE-B12-VIT C-FA-IFC PO CAPS
1.0000 | ORAL_CAPSULE | Freq: Every day | ORAL | Status: DC
  Administered 2014-09-27 – 2014-09-28 (×2): 1 via ORAL
  Filled 2014-09-26 (×2): qty 1

## 2014-09-26 MED ORDER — ALBUTEROL SULFATE (2.5 MG/3ML) 0.083% IN NEBU: 2.5000 mg | INHALATION_SOLUTION | RESPIRATORY_TRACT | Status: DC | PRN

## 2014-09-26 MED ORDER — VANCOMYCIN HCL IN DEXTROSE 1-5 GM/200ML-% IV SOLN
INTRAVENOUS | Status: AC
  Filled 2014-09-26: qty 200

## 2014-09-26 MED ORDER — VANCOMYCIN HCL IN DEXTROSE 1-5 GM/200ML-% IV SOLN
1000.0000 mg | Freq: Once | INTRAVENOUS | Status: AC
  Administered 2014-09-26: 1000 mg via INTRAVENOUS
  Filled 2014-09-26: qty 200

## 2014-09-26 MED ORDER — HYDRALAZINE HCL 20 MG/ML IJ SOLN: 5.0000 mg | Freq: Four times a day (QID) | INTRAMUSCULAR | Status: DC | PRN

## 2014-09-26 MED ORDER — INSULIN ASPART 100 UNIT/ML ~~LOC~~ SOLN
0.0000 [IU] | Freq: Every day | SUBCUTANEOUS | Status: DC
  Administered 2014-09-27: 2 [IU] via SUBCUTANEOUS

## 2014-09-26 MED ORDER — PIPERACILLIN-TAZOBACTAM 3.375 G IVPB
INTRAVENOUS | Status: AC
  Filled 2014-09-26: qty 50

## 2014-09-26 MED ORDER — ATENOLOL 25 MG PO TABS
50.0000 mg | ORAL_TABLET | Freq: Every day | ORAL | Status: DC
  Administered 2014-09-27 – 2014-09-28 (×2): 50 mg via ORAL
  Filled 2014-09-26 (×3): qty 2

## 2014-09-26 MED ORDER — INSULIN ASPART 100 UNIT/ML ~~LOC~~ SOLN
0.0000 [IU] | Freq: Three times a day (TID) | SUBCUTANEOUS | Status: DC
  Administered 2014-09-26: 1 [IU] via SUBCUTANEOUS
  Administered 2014-09-27 (×2): 2 [IU] via SUBCUTANEOUS
  Administered 2014-09-28: 3 [IU] via SUBCUTANEOUS
  Administered 2014-09-28: 1 [IU] via SUBCUTANEOUS
  Administered 2014-09-28: 2 [IU] via SUBCUTANEOUS

## 2014-09-26 MED ORDER — LEVOFLOXACIN IN D5W 750 MG/150ML IV SOLN: 750.0000 mg | Freq: Once | INTRAVENOUS | Status: DC

## 2014-09-26 MED ORDER — PIPERACILLIN-TAZOBACTAM IN DEX 2-0.25 GM/50ML IV SOLN
2.2500 g | Freq: Three times a day (TID) | INTRAVENOUS | Status: DC
  Filled 2014-09-26 (×9): qty 50

## 2014-09-26 MED ORDER — ATENOLOL 25 MG PO TABS
12.5000 mg | ORAL_TABLET | Freq: Every evening | ORAL | Status: DC
  Administered 2014-09-26 – 2014-09-28 (×3): 12.5 mg via ORAL
  Filled 2014-09-26 (×3): qty 1

## 2014-09-26 MED ORDER — PRO-STAT SUGAR FREE PO LIQD
30.0000 mL | Freq: Two times a day (BID) | ORAL | Status: DC
  Administered 2014-09-26 – 2014-09-28 (×4): 30 mL via ORAL
  Filled 2014-09-26 (×4): qty 30

## 2014-09-26 NOTE — Progress Notes (Signed)
ANTIBIOTIC CONSULT NOTE-Preliminary  Pharmacy Consult for Vancomycin and Levofloxacin Indication: Pneumonia  Allergies  Allergen Reactions  . Codeine Other (See Comments)    Unknown reaction    Patient Measurements: Height: 5\' 4"  (162.6 cm) Weight: 136 lb 3.9 oz (61.8 kg) IBW/kg (Calculated) : 54.7   Vital Signs: Temp: 98.9 F (37.2 C) (06/16 2209) Temp Source: Axillary (06/16 2209) BP: 158/63 mmHg (06/16 2209) Pulse Rate: 80 (06/16 2209)  Labs:  Recent Labs  09/25/14 2117  WBC 18.2*  HGB 11.8*  PLT 462*  CREATININE 1.97*    Estimated Creatinine Clearance: 17.4 mL/min (by C-G formula based on Cr of 1.97).  No results for input(s): VANCOTROUGH, VANCOPEAK, VANCORANDOM, GENTTROUGH, GENTPEAK, GENTRANDOM, TOBRATROUGH, TOBRAPEAK, TOBRARND, AMIKACINPEAK, AMIKACINTROU, AMIKACIN in the last 72 hours.   Microbiology: Recent Results (from the past 720 hour(s))  Culture, blood (routine x 2)     Status: None   Collection Time: 08/28/14 10:00 AM  Result Value Ref Range Status   Specimen Description BLOOD PORTA CATH DRAWN BY RN  Final   Special Requests   Final    BOTTLES DRAWN AEROBIC AND ANAEROBIC AEB=8CC ANA=6CC   Culture NO GROWTH 5 DAYS  Final   Report Status 09/02/2014 FINAL  Final  Culture, blood (routine x 2)     Status: None   Collection Time: 08/28/14 10:18 AM  Result Value Ref Range Status   Specimen Description BLOOD LEFT HAND  Final   Special Requests BOTTLES DRAWN AEROBIC AND ANAEROBIC 6CC  Final   Culture NO GROWTH 5 DAYS  Final   Report Status 09/02/2014 FINAL  Final  Body fluid culture     Status: None   Collection Time: 08/29/14  2:55 PM  Result Value Ref Range Status   Specimen Description GALL BLADDER CHOLECYSTOSTOMY TUBE  Final   Special Requests NONE  Final   Gram Stain   Final    NO WBC SEEN FEW GRAM POSITIVE COCCI IN PAIRS IN CLUSTERS RARE GRAM NEGATIVE RODS Gram Stain Report Called to,Read Back By and Verified With: Gram Stain Report Called  to,Read Back By and Verified With: MISTY MCDANIEL AT 53 ON [id number] BY WEBSP Performed at [facility]    Culture   Final    MODERATE KLEBSIELLA PNEUMONIAE VANCOMYCIN RESISTANT ENTEROCOCCUS ISOLATED Note: CRITICAL RESULT CALLED TO, READ BACK BY AND VERIFIED WITH: VAL DEESE @ 0909 ON [id number] BY [facility] Performed at [facility]    Report Status 09/02/2014 FINAL  Final   Organism ID, Bacteria KLEBSIELLA PNEUMONIAE  Final   Organism ID, Bacteria VANCOMYCIN RESISTANT ENTEROCOCCUS ISOLATED  Final      Susceptibility   Klebsiella pneumoniae - MIC*    AMPICILLIN >=32 RESISTANT Resistant     AMPICILLIN/SULBACTAM 8 SENSITIVE Sensitive     CEFAZOLIN <=4 SENSITIVE Sensitive     CEFEPIME <=1 SENSITIVE Sensitive     CEFTAZIDIME <=1 SENSITIVE Sensitive     CEFTRIAXONE <=1 SENSITIVE Sensitive     CIPROFLOXACIN <=0.25 SENSITIVE Sensitive     GENTAMICIN <=1 SENSITIVE Sensitive     IMIPENEM <=0.25 SENSITIVE Sensitive     PIP/TAZO 8 SENSITIVE Sensitive     TOBRAMYCIN <=1 SENSITIVE Sensitive     TRIMETH/SULFA <=20 SENSITIVE Sensitive     * MODERATE KLEBSIELLA PNEUMONIAE   Vancomycin resistant enterococcus isolated - MIC*    AMPICILLIN >=32 RESISTANT Resistant     VANCOMYCIN 256 RESISTANT Resistant     LINEZOLID 2 SENSITIVE Sensitive     * VANCOMYCIN RESISTANT  ENTEROCOCCUS ISOLATED  Culture, blood (routine x 2)     Status: None   Collection Time: 09/08/14  9:05 AM  Result Value Ref Range Status   Specimen Description BLOOD LEFT ARM  Final   Special Requests BOTTLES DRAWN AEROBIC AND ANAEROBIC 5CC  Final   Culture NO GROWTH 5 DAYS  Final   Report Status 09/13/2014 FINAL  Final  Culture, blood (routine x 2)     Status: None   Collection Time: 09/08/14  9:25 AM  Result Value Ref Range Status   Specimen Description BLOOD LEFT ARM  Final   Special Requests BOTTLES DRAWN AEROBIC ONLY 8CC  Final   Culture NO GROWTH 5 DAYS  Final   Report Status 09/13/2014 FINAL  Final  MRSA PCR  Screening     Status: None   Collection Time: 09/14/14  6:51 PM  Result Value Ref Range Status   MRSA by PCR NEGATIVE NEGATIVE Final    Comment:        The GeneXpert MRSA Assay (FDA approved for NASAL specimens only), is one component of a comprehensive MRSA colonization surveillance program. It is not intended to diagnose MRSA infection nor to guide or monitor treatment for MRSA infections.   Culture, blood (routine x 2)     Status: None   Collection Time: 09/14/14  8:12 PM  Result Value Ref Range Status   Specimen Description BLOOD LEFT ANTECUBITAL  Final   Special Requests BOTTLES DRAWN AEROBIC ONLY 10CC  Final   Culture   Final    NO GROWTH 5 DAYS Performed at [facility]    Report Status 09/21/2014 FINAL  Final  Culture, blood (routine x 2)     Status: None   Collection Time: 09/14/14  8:21 PM  Result Value Ref Range Status   Specimen Description BLOOD LEFT HAND  Final   Special Requests BOTTLES DRAWN AEROBIC ONLY 5CC  Final   Culture   Final    NO GROWTH 5 DAYS Performed at [facility]    Report Status 09/21/2014 FINAL  Final  Culture, blood (routine x 2)     Status: None (Preliminary result)   Collection Time: 09/25/14  9:17 PM  Result Value Ref Range Status   Specimen Description PORTA CATH  Final   Special Requests BOTTLES DRAWN AEROBIC AND ANAEROBIC 8CC EACH  Final   Culture PENDING  Incomplete   Report Status PENDING  Incomplete  Culture, blood (routine x 2)     Status: None (Preliminary result)   Collection Time: 09/25/14  9:30 PM  Result Value Ref Range Status   Specimen Description BLOOD LEFT HAND  Final   Special Requests BOTTLES DRAWN AEROBIC AND ANAEROBIC [facility] EACH  Final   Culture PENDING  Incomplete   Report Status PENDING  Incomplete    Medical History: Past Medical History  Diagnosis Date  . Stroke   . Diabetes mellitus without complication   . Hypertension   . High cholesterol   . Brain tumor (benign)   . Chronic  bronchitis   . Seizures   . Bacteremia due to vancomycin resistant Enterococcus 08/25/2014  . Sepsis secondary to UTI 08/25/2014    and acute cholecystitis/VRE bacteremia.  . Cholecystitis, acute 08/25/2014    Medications:  Levofloxacin 500 mg IV daily (PTA medication per patient) last given 09/25/14 at 1100 Zosyn 3.375 Gm IV every 8 hours (PTA medication per patient) last given 09/25/14 at 1600  Assessment: 81 yo female nursing home resident  seen in the ED with altered mental status. PT s/p recent hospitalization and receiving IV antibiotics per her SNF. Pt has elevated WBCs. Zosyn and Levaquin to be continued on admission with Vancomycin added for HCAP.  Goal of Therapy:  Vancomycin troughs 15-20 mcg/ml Eradicate infection  Plan:  Preliminary review of pertinent patient information completed.  Protocol will be initiated with a one-time dose of Vancomycin 1 Gm IV.  Forestine Na clinical pharmacist will complete review during morning rounds to assess patient and finalize treatment regimen.  Norberto Sorenson, [facility] 09/26/2014,3:16 AM

## 2014-09-26 NOTE — Progress Notes (Signed)
ANTIBIOTIC CONSULT NOTE-Preliminary  Pharmacy Consult for Vancomycin, Zosyn and Levofloxacin Indication: Pneumonia  Allergies  Allergen Reactions  . Codeine Other (See Comments)    Unknown reaction   Patient Measurements: Height: 5\' 4"  (162.6 cm) Weight: 129 lb 13.6 oz (58.9 kg) IBW/kg (Calculated) : 54.7  Vital Signs: Temp: 98.6 F (37 C) (06/17 0547) Temp Source: Axillary (06/17 0547) BP: 172/63 mmHg (06/17 0547) Pulse Rate: 69 (06/17 0547)  Labs:  Recent Labs  09/25/14 2117  WBC 18.2*  HGB 11.8*  PLT 462*  CREATININE 1.97*   Estimated Creatinine Clearance: 17.4 mL/min (by C-G formula based on Cr of 1.97).  No results for input(s): VANCOTROUGH, VANCOPEAK, VANCORANDOM, GENTTROUGH, GENTPEAK, GENTRANDOM, TOBRATROUGH, TOBRAPEAK, TOBRARND, AMIKACINPEAK, AMIKACINTROU, AMIKACIN in the last 72 hours.   Microbiology: Recent Results (from the past 720 hour(s))  Culture, blood (routine x 2)     Status: None   Collection Time: 08/28/14 10:00 AM  Result Value Ref Range Status   Specimen Description BLOOD PORTA CATH DRAWN BY RN  Final   Special Requests   Final    BOTTLES DRAWN AEROBIC AND ANAEROBIC AEB=8CC ANA=6CC   Culture NO GROWTH 5 DAYS  Final   Report Status 09/02/2014 FINAL  Final  Culture, blood (routine x 2)     Status: None   Collection Time: 08/28/14 10:18 AM  Result Value Ref Range Status   Specimen Description BLOOD LEFT HAND  Final   Special Requests BOTTLES DRAWN AEROBIC AND ANAEROBIC 6CC  Final   Culture NO GROWTH 5 DAYS  Final   Report Status 09/02/2014 FINAL  Final  Body fluid culture     Status: None   Collection Time: 08/29/14  2:55 PM  Result Value Ref Range Status   Specimen Description GALL BLADDER CHOLECYSTOSTOMY TUBE  Final   Special Requests NONE  Final   Gram Stain   Final    NO WBC SEEN FEW GRAM POSITIVE COCCI IN PAIRS IN CLUSTERS RARE GRAM NEGATIVE RODS Gram Stain Report Called to,Read Back By and Verified With: Gram Stain Report Called  to,Read Back By and Verified With: MISTY MCDANIEL AT 54 ON [id number] BY WEBSP Performed at [facility]    Culture   Final    MODERATE KLEBSIELLA PNEUMONIAE VANCOMYCIN RESISTANT ENTEROCOCCUS ISOLATED Note: CRITICAL RESULT CALLED TO, READ BACK BY AND VERIFIED WITH: VAL DEESE @ 0909 ON [id number] BY [facility] Performed at [facility]    Report Status 09/02/2014 FINAL  Final   Organism ID, Bacteria KLEBSIELLA PNEUMONIAE  Final   Organism ID, Bacteria VANCOMYCIN RESISTANT ENTEROCOCCUS ISOLATED  Final      Susceptibility   Klebsiella pneumoniae - MIC*    AMPICILLIN >=32 RESISTANT Resistant     AMPICILLIN/SULBACTAM 8 SENSITIVE Sensitive     CEFAZOLIN <=4 SENSITIVE Sensitive     CEFEPIME <=1 SENSITIVE Sensitive     CEFTAZIDIME <=1 SENSITIVE Sensitive     CEFTRIAXONE <=1 SENSITIVE Sensitive     CIPROFLOXACIN <=0.25 SENSITIVE Sensitive     GENTAMICIN <=1 SENSITIVE Sensitive     IMIPENEM <=0.25 SENSITIVE Sensitive     PIP/TAZO 8 SENSITIVE Sensitive     TOBRAMYCIN <=1 SENSITIVE Sensitive     TRIMETH/SULFA <=20 SENSITIVE Sensitive     * MODERATE KLEBSIELLA PNEUMONIAE   Vancomycin resistant enterococcus isolated - MIC*    AMPICILLIN >=32 RESISTANT Resistant     VANCOMYCIN 256 RESISTANT Resistant     LINEZOLID 2 SENSITIVE Sensitive     * VANCOMYCIN RESISTANT ENTEROCOCCUS ISOLATED  Culture, blood (routine x 2)     Status: None   Collection Time: 09/08/14  9:05 AM  Result Value Ref Range Status   Specimen Description BLOOD LEFT ARM  Final   Special Requests BOTTLES DRAWN AEROBIC AND ANAEROBIC 5CC  Final   Culture NO GROWTH 5 DAYS  Final   Report Status 09/13/2014 FINAL  Final  Culture, blood (routine x 2)     Status: None   Collection Time: 09/08/14  9:25 AM  Result Value Ref Range Status   Specimen Description BLOOD LEFT ARM  Final   Special Requests BOTTLES DRAWN AEROBIC ONLY 8CC  Final   Culture NO GROWTH 5 DAYS  Final   Report Status 09/13/2014 FINAL  Final  MRSA PCR  Screening     Status: None   Collection Time: 09/14/14  6:51 PM  Result Value Ref Range Status   MRSA by PCR NEGATIVE NEGATIVE Final    Comment:        The GeneXpert MRSA Assay (FDA approved for NASAL specimens only), is one component of a comprehensive MRSA colonization surveillance program. It is not intended to diagnose MRSA infection nor to guide or monitor treatment for MRSA infections.   Culture, blood (routine x 2)     Status: None   Collection Time: 09/14/14  8:12 PM  Result Value Ref Range Status   Specimen Description BLOOD LEFT ANTECUBITAL  Final   Special Requests BOTTLES DRAWN AEROBIC ONLY 10CC  Final   Culture   Final    NO GROWTH 5 DAYS Performed at [facility]    Report Status 09/21/2014 FINAL  Final  Culture, blood (routine x 2)     Status: None   Collection Time: 09/14/14  8:21 PM  Result Value Ref Range Status   Specimen Description BLOOD LEFT HAND  Final   Special Requests BOTTLES DRAWN AEROBIC ONLY 5CC  Final   Culture   Final    NO GROWTH 5 DAYS Performed at [facility]    Report Status 09/21/2014 FINAL  Final  Culture, blood (routine x 2)     Status: None (Preliminary result)   Collection Time: 09/25/14  9:17 PM  Result Value Ref Range Status   Specimen Description PORTA CATH  Final   Special Requests BOTTLES DRAWN AEROBIC AND ANAEROBIC 8CC EACH  Final   Culture PENDING  Incomplete   Report Status PENDING  Incomplete  Culture, blood (routine x 2)     Status: None (Preliminary result)   Collection Time: 09/25/14  9:30 PM  Result Value Ref Range Status   Specimen Description BLOOD LEFT HAND  Final   Special Requests BOTTLES DRAWN AEROBIC AND ANAEROBIC [facility] EACH  Final   Culture PENDING  Incomplete   Report Status PENDING  Incomplete   Medical History: Past Medical History  Diagnosis Date  . Stroke   . Diabetes mellitus without complication   . Hypertension   . High cholesterol   . Brain tumor (benign)   . Chronic  bronchitis   . Seizures   . Bacteremia due to vancomycin resistant Enterococcus 08/25/2014  . Sepsis secondary to UTI 08/25/2014    and acute cholecystitis/VRE bacteremia.  . Cholecystitis, acute 08/25/2014   PTA Medications:  Levofloxacin 500 mg IV daily (PTA medication per patient) last given 09/25/14 at 1100 Zosyn 3.375 Gm IV every 8 hours (PTA medication per patient) last given 09/25/14 at 1600.  * Pt received Vancomycin 1000mg  IV x 1 on admission.  Assessment: 81 yo female nursing home resident seen in the ED with altered mental status. PT s/p recent hospitalization and receiving IV antibiotics per her SNF. Pt has elevated WBCs. Zosyn and Levaquin to be continued on admission with Vancomycin added for HCAP.  SCr elevated.  Normalized ClCr ~ 15-20 ml/min  Goal of Therapy:  Vancomycin troughs 15-20 mcg/ml Eradicate infection  Plan:  Vancomycin 500mg  IV q24hrs starting tomorrow (maintenenace dose) Check trough at steady state Zosyn 2.25gm IV q8h (renally adjusted) Levaquin 500mg  IV q48hrs starting tomorrow (renally adjusted) Monitor labs, renal fxn, progress and c/s Deescalate ABX when improved / appropriate Duration of therapy per MD  Hart Robinsons A, RPH 09/26/2014,11:06 AM

## 2014-09-26 NOTE — Progress Notes (Signed)
Subjective: AMS:  More responsive today.  Leukocytosis: unclear etiology,  ? Aspiration. Awaiting rheumatologic w./up Dm2:  bs appear controlled H/o CVA  Stable     Objective: Vital signs in last 24 hours: Temp:  [97.9 F (36.6 C)-98.9 F (37.2 C)] 97.9 F (36.6 C) (06/17 1446) Pulse Rate:  [66-80] 66 (06/17 1446) Resp:  [18] 18 (06/17 1446) BP: (158-172)/(54-63) 166/54 mmHg (06/17 1446) SpO2:  [99 %-100 %] 100 % (06/17 1446) Weight:  [58.514 kg (129 lb)-61.8 kg (136 lb 3.9 oz)] 58.514 kg (129 lb) (06/17 1159) Weight change:     Intake/Output from previous day: 06/16 0701 - 06/17 0700 In: 3 [I.V.:3] Out: 600 [Drains:600] Intake/Output this shift:    Heent: anicteric Neck: no jvd Heart: rrr s1, s2 Lung: ctab Abd: soft, nt, nd, +bs Ext: no c/c/e  Lab Results:  Recent Labs  09/25/14 2117  WBC 18.2*  HGB 11.8*  HCT 37.4  PLT 462*   BMET  Recent Labs  09/25/14 2117  NA 143  K 4.2  CL 100*  CO2 30  GLUCOSE 160*  BUN 33*  CREATININE 1.97*  CALCIUM 8.5*    Studies/Results: Dg Chest 1 View  09/25/2014   CLINICAL DATA:  Altered mental status come recent seizure activity  EXAM: CHEST  1 VIEW  COMPARISON:  09/15/2014  FINDINGS: Cardiac shadow is stable. A right-sided PICC line is again noted in satisfactory position. Improved aeration in the right lung base is noted although some persistent changes are seen. No new focal infiltrate is noted. No bony abnormality is seen.  IMPRESSION: Improved aeration in the right base with some residual infiltrate identified.   Electronically Signed   By: Inez Catalina M.D.   On: 09/25/2014 22:00   Ct Head Wo Contrast  09/25/2014   CLINICAL DATA:  Altered mental status  EXAM: CT HEAD WITHOUT CONTRAST  TECHNIQUE: Contiguous axial images were obtained from the base of the skull through the vertex without intravenous contrast.  COMPARISON:  09/05/2014  FINDINGS: Postsurgical changes are again identified on the right. The bony  calvarium is otherwise intact. Diffuse atrophic changes and chronic white matter ischemic change are seen and stable. No findings to suggest acute hemorrhage, acute infarction or space-occupying mass lesion are noted. Old lacunar infarcts are noted in the basal ganglia on the left.  IMPRESSION: Chronic atrophic and ischemic changes. No significant interval change from the prior exam.   Electronically Signed   By: Inez Catalina M.D.   On: 09/25/2014 21:56   Mr Lumbar Spine Wo Contrast  09/26/2014   CLINICAL DATA:  Leukocytosis.  Spinal infection.  Bacteremia.  EXAM: MRI LUMBAR SPINE WITHOUT CONTRAST  TECHNIQUE: Multiplanar, multisequence MR imaging of the lumbar spine was performed. No intravenous contrast was administered.  COMPARISON:  CT abdomen 09/15/2014.  FINDINGS: Segmentation: The numbering convention used for this exam termed L5-S1 as the last intervertebral disc space. 5 lumbar type vertebral bodies on prior CT.  Alignment: 2 mm anterolisthesis of L4 on L5 appears degenerative and facet mediated.  Vertebrae: Bone marrow signal shows heterogenous marrow. This is a nonspecific finding most commonly associated with obesity, anemia, cigarette smoking or chronic disease. Large hemangiomata are present in the L1 and L5 vertebral bodies. No aggressive features. No compression fracture. There is also a partially visible benign T11 vertebral body hemangioma.  Conus medullaris: Normal termination at T12-L1.  Paraspinal tissues: Bilateral renal cysts. Susceptibility artifact associated with IVC filter. Cystic adnexal lesions are present on the LEFT,  likely representing ovarian cyst seen on prior CT.  Disc levels:  Disc Signal: Age expected disc degeneration and desiccation. Vacuum disc was identified on prior CT at L4-L5.  T11-T12: Sagittal imaging.  Disc desiccation.  T12-L1:  Mild RIGHT eccentric disc bulging without stenosis.  L1-L2:  Shallow broad-based disc bulging without stenosis.  L2-L3: Minimal disc bulging  without stenosis. This is RIGHT eccentric.  L3-L4:  Shallow disc bulging without stenosis.  L4-L5: Posterior disc bulging and posterior ligamentum flavum redundancy. This produces mild central stenosis. LEFT-greater-than- RIGHT mild bilateral facet arthrosis. Both neural foramina appear adequately patent. Subarticular zones patent.  L5-S1:  Negative.  There are no findings to suggest septic arthritis or discitis/osteomyelitis.  IMPRESSION: No evidence of spinal infection. L4-L5 mild central stenosis. Mild L4-L5 grade I anterolisthesis associated with degenerative disc and facet disease.   Electronically Signed   By: Dereck Ligas M.D.   On: 09/26/2014 12:45   US Abdomen Limited  09/26/2014   CLINICAL DATA:  Leukocytosis, post cholecystostomy tube placement, history diabetes mellitus, hypertension, sepsis due to UTI  EXAM: US ABDOMEN LIMITED - RIGHT UPPER QUADRANT  COMPARISON:  CT abdomen pelvis 09/15/2014  FINDINGS: Gallbladder:  Decompressed by cholecystostomy tube, collapsed. Wall appears prominent though this is likely an artifact related to collapsed state. No definite pericholecystic fluid or shadowing calculi. No sonographic Murphy sign.  Common bile duct:  Diameter: 6 mm diameter, normal for age  Liver:  Grossly normal appearance.  No RIGHT upper quadrant free fluid.  IMPRESSION: Decompressed gallbladder containing cholecystostomy tube.  Otherwise negative exam.   Electronically Signed   By: Lavonia Dana M.D.   On: 09/26/2014 10:46    Medications: I have reviewed the patient's current medications.  Assessment/Plan: AMS:  Pt appears to be back to baseline  Leukocytosis Blood cx x2 negative so far Echo negative ESR high, spep, upep pending  CKD stage 3/4 Hydrate gently with ns iv  Pneumonia, hcap Cont vanco, zosyn, levaquin iv Appreciate pharmacy input  Anemia Check cbc in am,  Spep, upep pending  DVT prophylaxis   LOS: 1 day   Jani Gravel 09/26/2014, 7:59 PM

## 2014-09-26 NOTE — Care Management Note (Signed)
Case Management Note  Patient Details  Name: Tina Hunter MRN: [id number] Date of Birth: [birth year]  Subjective/Objective:                  Pt admitted from home [facility] with fever. Anticipate discharge back to facility at discharge.  Action/Plan: CSW is aware and will arrange discharge back to facility when medically stable.  Expected Discharge Date:                  Expected Discharge Plan:  Skilled Nursing Facility  In-House Referral:  Clinical Social Work  Discharge planning Services  CM Consult  Post Acute Care Choice:    Choice offered to:     DME Arranged:    DME Agency:     HH Arranged:    [place] Agency:     Status of Service:  In process, will continue to follow  Medicare Important Message Given:    Date Medicare IM Given:    Medicare IM give by:    Date Additional Medicare IM Given:    Additional Medicare Important Message give by:     If discussed at [place] of Stay Meetings, dates discussed:    Additional Comments:  Joylene Draft, RN 09/26/2014, 8:26 AM

## 2014-09-26 NOTE — Progress Notes (Signed)
  Echocardiogram 2D Echocardiogram has been performed.  Darlina Sicilian M 09/26/2014, 2:51 PM

## 2014-09-26 NOTE — Care Management Note (Signed)
Case Management Note  Patient Details  Name: Tina Hunter MRN: [id number] Date of Birth: December 10, 1924  Subjective/Objective:                    Action/Plan:   Expected Discharge Date:                  Expected Discharge Plan:  Skilled Nursing Facility  In-House Referral:  Clinical Social Work  Discharge planning Services  CM Consult  Post Acute Care Choice:    Choice offered to:     DME Arranged:    DME Agency:     HH Arranged:    [place] Agency:     Status of Service:  In process, will continue to follow  Medicare Important Message Given:  Yes Date Medicare IM Given:  09/26/14 Medicare IM give by:  Christinia Gully, RN BSN CM Date Additional Medicare IM Given:    Additional Medicare Important Message give by:     If discussed at [place] of Stay Meetings, dates discussed:    Additional Comments: Pts daughter is still undecided as to whether pt will discharge back to [facility] or discharge home. If discharges home please arrange home health with [facility], number is [phone]. Pt has home O2 with [facility] already at home. If pt needs any DME for home at discharge please arrange with [facility], number is [phone].  Christinia Gully Kelley, RN 09/26/2014, 3:57 PM

## 2014-09-26 NOTE — Clinical Social Work Note (Signed)
Clinical Social Work Assessment  Patient Details  Name: Tina Hunter MRN: [id number] Date of Birth: 09/12/1924  Date of referral:  09/26/14               Reason for consult:  Facility Placement                Permission sought to share information with:  Facility Contact Representative Permission granted to share information::  Yes, Verbal Permission Granted  Name::        Agency::  [facility]  Relationship::     Contact Information:     Housing/Transportation Living arrangements for the past 2 months:  Skilled Nursing Facility Source of Information:  Adult Children Patient Interpreter Needed:  None Criminal Activity/Legal Involvement Pertinent to Current Situation/Hospitalization:  No - Comment as needed Significant Relationships:  Adult Children Lives with:  Facility Resident Do you feel safe going back to the place where you live?  Yes Need for family participation in patient care:  Yes (Comment)  Care giving concerns:  Pt is from SNF. Family is excellent support.    Social Worker assessment / plan:  CSW met with pt's daughter, Tina Hunter at bedside. Pt oriented to self only. Pt has been a resident at [facility] for less than two weeks with a hospital stay at [facility] as well. Family is very involved and supportive. Tina Hunter indicates they are with pt almost all day at [facility]. She shared that pt will do okay for a day, but then slides back. They have been somewhat frustrated that pt has some skin breakdown when they worked very hard at home to make sure this did not happen. Family trust Dr. Kim and are following his recommendations as far as d/c plan. They will discuss further, but are not sure at this time if pt will return to [facility] or if they would like to take her back home. Per Tina Hunter at facility, pt has not been making much progress. Okay to return at d/c.   Employment status:  Retired Insurance information:  Medicare PT Recommendations:  Not assessed at this time Information / Referral to  community resources:  Other (Comment Required) (either return to [facility] or home)  Patient/Family's Response to care:  Family's goal is to get pt home as soon as possible. They are unsure at this time if she will return to [facility] at d/c or home.   Patient/Family's Understanding of and Emotional Response to Diagnosis, Current Treatment, and Prognosis:  Family is very knowledgeable about pt's medical history and are very attentive to her. Daughter states that they are following Dr. Kim's recommendations for d/c plan.   Emotional Assessment Appearance:  Appears stated age Attitude/Demeanor/Rapport:  Unable to Assess Affect (typically observed):  Unable to Assess Orientation:  Oriented to Self Alcohol / Substance use:  Not Applicable Psych involvement (Current and /or in the community):  No (Comment)  Discharge Needs  Concerns to be addressed:  Discharge Planning Concerns Readmission within the last 30 days:  Yes Current discharge risk:  Physical Impairment Barriers to Discharge:  Continued Medical Work up   ,  [place], LCSW 09/26/2014, 8:55 AM [phone] 

## 2014-09-27 LAB — CBC WITH DIFFERENTIAL/PLATELET
BASOS PCT: 0 % (ref 0–1)
Basophils Absolute: 0 10*3/uL (ref 0.0–0.1)
Eosinophils Absolute: 0 10*3/uL (ref 0.0–0.7)
Eosinophils Relative: 0 % (ref 0–5)
HEMATOCRIT: 36.2 % (ref 36.0–46.0)
HEMOGLOBIN: 11.4 g/dL — AB (ref 12.0–15.0)
LYMPHS ABS: 1.2 10*3/uL (ref 0.7–4.0)
LYMPHS PCT: 7 % — AB (ref 12–46)
MCH: 30.2 pg (ref 26.0–34.0)
MCHC: 31.5 g/dL (ref 30.0–36.0)
MCV: 95.8 fL (ref 78.0–100.0)
MONO ABS: 0.4 10*3/uL (ref 0.1–1.0)
Monocytes Relative: 3 % (ref 3–12)
NEUTROS ABS: 14.2 10*3/uL — AB (ref 1.7–7.7)
Neutrophils Relative %: 90 % — ABNORMAL HIGH (ref 43–77)
Platelets: 451 10*3/uL — ABNORMAL HIGH (ref 150–400)
RBC: 3.78 MIL/uL — ABNORMAL LOW (ref 3.87–5.11)
RDW: 14 % (ref 11.5–15.5)
WBC: 15.8 10*3/uL — AB (ref 4.0–10.5)

## 2014-09-27 LAB — COMPREHENSIVE METABOLIC PANEL
ALT: 18 U/L (ref 14–54)
AST: 22 U/L (ref 15–41)
Albumin: 2.3 g/dL — ABNORMAL LOW (ref 3.5–5.0)
Alkaline Phosphatase: 51 U/L (ref 38–126)
Anion gap: 10 (ref 5–15)
BUN: 33 mg/dL — AB (ref 6–20)
CO2: 30 mmol/L (ref 22–32)
Calcium: 8.3 mg/dL — ABNORMAL LOW (ref 8.9–10.3)
Chloride: 102 mmol/L (ref 101–111)
Creatinine, Ser: 1.5 mg/dL — ABNORMAL HIGH (ref 0.44–1.00)
GFR calc non Af Amer: 30 mL/min — ABNORMAL LOW (ref >60)
GFR, EST AFRICAN AMERICAN: 35 mL/min — AB (ref >60)
Glucose, Bld: 204 mg/dL — ABNORMAL HIGH (ref 65–99)
Potassium: 4.3 mmol/L (ref 3.5–5.1)
SODIUM: 142 mmol/L (ref 135–145)
TOTAL PROTEIN: 6.5 g/dL (ref 6.5–8.1)
Total Bilirubin: 0.5 mg/dL (ref 0.3–1.2)

## 2014-09-27 LAB — GLUCOSE, CAPILLARY
GLUCOSE-CAPILLARY: 175 mg/dL — AB (ref 65–99)
GLUCOSE-CAPILLARY: 87 mg/dL (ref 65–99)
GLUCOSE-CAPILLARY: 201 mg/dL — AB (ref 65–99)
Glucose-Capillary: 160 mg/dL — ABNORMAL HIGH (ref 65–99)

## 2014-09-27 LAB — SEDIMENTATION RATE: SED RATE: 106 mm/h — AB (ref 0–22)

## 2014-09-27 MED ORDER — LEVOFLOXACIN IN D5W 500 MG/100ML IV SOLN: 500.0000 mg | INTRAVENOUS | Status: DC

## 2014-09-27 MED ORDER — PIPERACILLIN SOD-TAZOBACTAM SO 2.25 (2-0.25) G IV SOLR: 2.2500 g | Freq: Three times a day (TID) | INTRAVENOUS | Status: DC

## 2014-09-27 MED ORDER — ALBUTEROL SULFATE (2.5 MG/3ML) 0.083% IN NEBU: 2.5000 mg | INHALATION_SOLUTION | Freq: Three times a day (TID) | RESPIRATORY_TRACT | Status: AC

## 2014-09-27 MED ORDER — PREDNISONE 20 MG PO TABS: 60.0000 mg | ORAL_TABLET | Freq: Every day | ORAL | Status: DC

## 2014-09-27 MED ORDER — VANCOMYCIN HCL 500 MG IV SOLR: 500.0000 mg | INTRAVENOUS | Status: DC

## 2014-09-27 NOTE — Plan of Care (Signed)
Problem: Phase I Progression Outcomes Goal: OOB as tolerated unless otherwise ordered Outcome: Not Applicable Date Met:  06/10/47 Pt bedridden

## 2014-09-27 NOTE — Discharge Summary (Signed)
Physician Discharge Summary  Patient ID: Tina Hunter MRN: [id number] DOB/AGE: 12/31/1924 81 y.o.  Admit date: 09/25/2014 Discharge date: 09/27/2014  Admission Diagnoses: Altered mental status Leukocytosis (persistent) Sed rate elevation CVA Renal insufficiency  Discharge Diagnoses:  Principal Problem:   Altered mental status Leukocytosis (improved) Renal insufficiency (improved) CVA ESR elevation  Discharged Condition: stable  Hospital Course: 81 yo female with hx of CVA, recent hcap, apparently presents with altered mental status at SNF,  Admitted for observation.  CT brain negative, mild increase in creatinine.  Pt was placed on vanco, iv in addition to zosyn and levaquin and wbc improved.  Blood culture negative x2,  Echocardiogram => negative for endocarditis,  ESR elevated. Spep, upep pending.  Pt denies headache but is a poor historian.  Slight myalgia per pt?    Pt given 1 dose of prednisone.  Unable to ascertain if this helped her,  Pt appears stable and will be sent back to SNF today.    Consults: None  Significant Diagnostic Studies: MRI L spine negative, CT brain negative,  CXR infiltrate  Treatments: antibiotics: vancomycin, Zosyn and Levaquin  Discharge Exam: Blood pressure 153/54, pulse 72, temperature 98 F (36.7 C), temperature source Oral, resp. rate 20, height _0  (1.626 m), weight 58.3 kg (128 lb 8.5 oz), SpO2 100 %. Heent: anicteric Neck: no jvd Heart: rrr s1, s2 Lung: ctab Abd: soft, nt, nd, +bs Ext: no c/c/e  AMS: Pt appears to be back to baseline  Leukocytosis Blood cx x2 negative so far Echo negative ESR high, spep, upep pending  CKD stage 3/4 Improved after iv ns  Pneumonia, hcap Cont vanco, zosyn, levaquin iv for 5 more days  Anemia Check cbc in am, Spep, upep pending  ESR elevation Prednisone 14m po qday x 1 day  While awaiting spep, upep results, ? PMR.   Please contact me on Sunday to adjust dose   Disposition:  03-Skilled Nursing Facility     Medication List    STOP taking these medications        azithromycin 500 mg in dextrose 5 % 250 mL     Normal Saline Flush 0.9 % Soln     piperacillin-tazobactam 3.375 GM/50ML IVPB  Commonly known as:  ZOSYN      TAKE these medications        acetaminophen 650 MG CR tablet  Commonly known as:  TYLENOL  Take 650 mg by mouth every 4 (four) hours as needed for pain.     albuterol (2.5 MG/3ML) 0.083% nebulizer solution  Commonly known as:  PROVENTIL  Take 3 mLs (2.5 mg total) by nebulization every 8 (eight) hours.     amLODipine 10 MG tablet  Commonly known as:  NORVASC  Take 10 mg by mouth daily.     aspirin 81 MG chewable tablet  Commonly known as:  ASPIRIN CHILDRENS  Chew 1 tablet (81 mg total) by mouth daily.     atenolol 50 MG tablet  Commonly known as:  TENORMIN  Take 50 mg by mouth daily.     atenolol 25 MG tablet  Commonly known as:  TENORMIN  Take 0.5 tablets (12.5 mg total) by mouth every evening.     carbamazepine 200 MG tablet  Commonly known as:  TEGRETOL  Take 400 mg by mouth 3 (three) times daily.     escitalopram 10 MG tablet  Commonly known as:  LEXAPRO  Take 10 mg by mouth daily.     feeding supplement (PRO-STAT  SUGAR FREE 64) Liqd  Take 30 mLs by mouth 2 (two) times daily.     HEMATINIC PLUS COMPLEX 106-1 MG Tabs  Take 1 tablet by mouth daily.     HEPARIN LOCK FLUSH IV  Inject 5 mLs into the vein 3 (three) times daily.     insulin aspart 100 UNIT/ML injection  Commonly known as:  novoLOG  Inject 2-10 Units into the skin See admin instructions. SLIDING SCALE INSTRUCTIONS: 150-200= 2 UNITS, 201-250= 4 UNITS, 251-300= 6 UNITS 301-350= 8 UNITS, 351-400= 10 UNITS, GREATER THAN 400= GIVE 15 UNITS AND NOTIFY MD. BEFORE MEALS     levofloxacin 500 MG/100ML Soln  Commonly known as:  LEVAQUIN  Inject 100 mLs (500 mg total) into the vein every other day.     levothyroxine 50 MCG tablet  Commonly known as:   SYNTHROID, LEVOTHROID  Take 50 mcg by mouth daily before breakfast.     meclizine 25 MG tablet  Commonly known as:  ANTIVERT  Take 25 mg by mouth daily as needed for dizziness.     memantine 10 MG tablet  Commonly known as:  NAMENDA  Take 1 tablet (10 mg total) by mouth daily.     OXYGEN  Inhale 2 L into the lungs daily.     pantoprazole 20 MG tablet  Commonly known as:  PROTONIX  Take 20 mg by mouth daily.     piperacillin-tazobactam 2.25 g in dextrose 5 % 50 mL  Inject 2.25 g into the vein every 8 (eight) hours.     predniSONE 20 MG tablet  Commonly known as:  DELTASONE  Take 3 tablets (60 mg total) by mouth daily with breakfast.     RESOURCE 2.0 PO  Take 120 mLs by mouth 3 (three) times daily.     rosuvastatin 20 MG tablet  Commonly known as:  CRESTOR  Take 0.5 tablets (10 mg total) by mouth every evening.     vancomycin 500 mg in sodium chloride 0.9 % 100 mL  Inject 500 mg into the vein daily.           Follow-up Information    Follow up with SNF NP or physician In 2 days.      SignedJani Gravel 09/27/2014, 7:29 AM

## 2014-09-27 NOTE — Progress Notes (Signed)
Subjective: AMS: More responsive today.  Leukocytosis: unclear etiology, ? Aspiration.  Dm2: bs appear controlled H/o CVA Stable   Objective: Vital signs in last 24 hours: Temp:  [97.7 F (36.5 C)-98.1 F (36.7 C)] 97.7 F (36.5 C) (06/18 1255) Pulse Rate:  [68-73] 68 (06/18 1255) Resp:  [20] 20 (06/18 1255) BP: (127-153)/(47-54) 127/53 mmHg (06/18 1255) SpO2:  [98 %-100 %] 100 % (06/18 1255) Weight:  [58.3 kg (128 lb 8.5 oz)] 58.3 kg (128 lb 8.5 oz) (06/18 0547) Weight change: -3.5 kg (-7 lb 11.5 oz) Last BM Date: 09/27/14  Intake/Output from previous day: 06/17 0701 - 06/18 0700 In: 1060 [P.O.:240; I.V.:620; IV Piggyback:200] Out: 1310 [Urine:1100; Drains:210] Intake/Output this shift:    Heent: anicteric Neck: no jvd Heart: rrr s1, s2 Lung: ctab Abd: soft, nt, nd, +bs Ext: no c/c/e Gen: more alert today  Lab Results:  Recent Labs  09/25/14 2117 09/27/14 0638  WBC 18.2* 15.8*  HGB 11.8* 11.4*  HCT 37.4 36.2  PLT 462* 451*   BMET  Recent Labs  09/25/14 2117 09/27/14 0638  NA 143 142  K 4.2 4.3  CL 100* 102  CO2 30 30  GLUCOSE 160* 204*  BUN 33* 33*  CREATININE 1.97* 1.50*  CALCIUM 8.5* 8.3*    Studies/Results: Dg Chest 1 View  09/25/2014   CLINICAL DATA:  Altered mental status come recent seizure activity  EXAM: CHEST  1 VIEW  COMPARISON:  09/15/2014  FINDINGS: Cardiac shadow is stable. A right-sided PICC line is again noted in satisfactory position. Improved aeration in the right lung base is noted although some persistent changes are seen. No new focal infiltrate is noted. No bony abnormality is seen.  IMPRESSION: Improved aeration in the right base with some residual infiltrate identified.   Electronically Signed   By: Inez Catalina M.D.   On: 09/25/2014 22:00   Ct Head Wo Contrast  09/25/2014   CLINICAL DATA:  Altered mental status  EXAM: CT HEAD WITHOUT CONTRAST  TECHNIQUE: Contiguous axial images were obtained from the base of the skull  through the vertex without intravenous contrast.  COMPARISON:  09/05/2014  FINDINGS: Postsurgical changes are again identified on the right. The bony calvarium is otherwise intact. Diffuse atrophic changes and chronic white matter ischemic change are seen and stable. No findings to suggest acute hemorrhage, acute infarction or space-occupying mass lesion are noted. Old lacunar infarcts are noted in the basal ganglia on the left.  IMPRESSION: Chronic atrophic and ischemic changes. No significant interval change from the prior exam.   Electronically Signed   By: Inez Catalina M.D.   On: 09/25/2014 21:56   Mr Lumbar Spine Wo Contrast  09/26/2014   CLINICAL DATA:  Leukocytosis.  Spinal infection.  Bacteremia.  EXAM: MRI LUMBAR SPINE WITHOUT CONTRAST  TECHNIQUE: Multiplanar, multisequence MR imaging of the lumbar spine was performed. No intravenous contrast was administered.  COMPARISON:  CT abdomen 09/15/2014.  FINDINGS: Segmentation: The numbering convention used for this exam termed L5-S1 as the last intervertebral disc space. 5 lumbar type vertebral bodies on prior CT.  Alignment: 2 mm anterolisthesis of L4 on L5 appears degenerative and facet mediated.  Vertebrae: Bone marrow signal shows heterogenous marrow. This is a nonspecific finding most commonly associated with obesity, anemia, cigarette smoking or chronic disease. Large hemangiomata are present in the L1 and L5 vertebral bodies. No aggressive features. No compression fracture. There is also a partially visible benign T11 vertebral body hemangioma.  Conus medullaris: Normal termination  at T12-L1.  Paraspinal tissues: Bilateral renal cysts. Susceptibility artifact associated with IVC filter. Cystic adnexal lesions are present on the LEFT, likely representing ovarian cyst seen on prior CT.  Disc levels:  Disc Signal: Age expected disc degeneration and desiccation. Vacuum disc was identified on prior CT at L4-L5.  T11-T12: Sagittal imaging.  Disc desiccation.   T12-L1:  Mild RIGHT eccentric disc bulging without stenosis.  L1-L2:  Shallow broad-based disc bulging without stenosis.  L2-L3: Minimal disc bulging without stenosis. This is RIGHT eccentric.  L3-L4:  Shallow disc bulging without stenosis.  L4-L5: Posterior disc bulging and posterior ligamentum flavum redundancy. This produces mild central stenosis. LEFT-greater-than- RIGHT mild bilateral facet arthrosis. Both neural foramina appear adequately patent. Subarticular zones patent.  L5-S1:  Negative.  There are no findings to suggest septic arthritis or discitis/osteomyelitis.  IMPRESSION: No evidence of spinal infection. L4-L5 mild central stenosis. Mild L4-L5 grade I anterolisthesis associated with degenerative disc and facet disease.   Electronically Signed   By: Dereck Ligas M.D.   On: 09/26/2014 12:45   US Abdomen Limited  09/26/2014   CLINICAL DATA:  Leukocytosis, post cholecystostomy tube placement, history diabetes mellitus, hypertension, sepsis due to UTI  EXAM: US ABDOMEN LIMITED - RIGHT UPPER QUADRANT  COMPARISON:  CT abdomen pelvis 09/15/2014  FINDINGS: Gallbladder:  Decompressed by cholecystostomy tube, collapsed. Wall appears prominent though this is likely an artifact related to collapsed state. No definite pericholecystic fluid or shadowing calculi. No sonographic Murphy sign.  Common bile duct:  Diameter: 6 mm diameter, normal for age  Liver:  Grossly normal appearance.  No RIGHT upper quadrant free fluid.  IMPRESSION: Decompressed gallbladder containing cholecystostomy tube.  Otherwise negative exam.   Electronically Signed   By: Lavonia Dana M.D.   On: 09/26/2014 10:46    Medications: I have reviewed the patient's current medications.  Assessment/Plan: AMS: Pt more alert this am,  Responding to my questions.  Leukocytosis Blood cx x2 negative so far Echo negative ESR high, spep, upep pending  CKD stage 3/4 Hydrate gently with ns iv  Pneumonia, hcap Cont vanco, zosyn, levaquin  iv Appreciate pharmacy input  Anemia Check cbc in am, Spep, upep pending  DVT prophylaxis    LOS: 2 days   Jani Gravel 09/27/2014, 9:29 PM

## 2014-09-28 LAB — CBC WITH DIFFERENTIAL/PLATELET
BASOS PCT: 0 % (ref 0–1)
Basophils Absolute: 0 10*3/uL (ref 0.0–0.1)
EOS ABS: 0.6 10*3/uL (ref 0.0–0.7)
Eosinophils Relative: 3 % (ref 0–5)
HCT: 37.1 % (ref 36.0–46.0)
HEMOGLOBIN: 12.1 g/dL (ref 12.0–15.0)
Lymphocytes Relative: 11 % — ABNORMAL LOW (ref 12–46)
Lymphs Abs: 1.9 10*3/uL (ref 0.7–4.0)
MCH: 30.9 pg (ref 26.0–34.0)
MCHC: 32.6 g/dL (ref 30.0–36.0)
MCV: 94.9 fL (ref 78.0–100.0)
Monocytes Absolute: 1.1 10*3/uL — ABNORMAL HIGH (ref 0.1–1.0)
Monocytes Relative: 7 % (ref 3–12)
NEUTROS ABS: 13.1 10*3/uL — AB (ref 1.7–7.7)
NEUTROS PCT: 79 % — AB (ref 43–77)
Platelets: 464 10*3/uL — ABNORMAL HIGH (ref 150–400)
RBC: 3.91 MIL/uL (ref 3.87–5.11)
RDW: 14.1 % (ref 11.5–15.5)
WBC: 16.6 10*3/uL — AB (ref 4.0–10.5)

## 2014-09-28 LAB — COMPREHENSIVE METABOLIC PANEL
ALK PHOS: 52 U/L (ref 38–126)
ALT: 23 U/L (ref 14–54)
AST: 27 U/L (ref 15–41)
Albumin: 2.5 g/dL — ABNORMAL LOW (ref 3.5–5.0)
Anion gap: 9 (ref 5–15)
BUN: 28 mg/dL — AB (ref 6–20)
CHLORIDE: 104 mmol/L (ref 101–111)
CO2: 30 mmol/L (ref 22–32)
Calcium: 8.5 mg/dL — ABNORMAL LOW (ref 8.9–10.3)
Creatinine, Ser: 1.34 mg/dL — ABNORMAL HIGH (ref 0.44–1.00)
GFR calc Af Amer: 40 mL/min — ABNORMAL LOW (ref >60)
GFR calc non Af Amer: 35 mL/min — ABNORMAL LOW (ref >60)
Glucose, Bld: 149 mg/dL — ABNORMAL HIGH (ref 65–99)
POTASSIUM: 3.7 mmol/L (ref 3.5–5.1)
Sodium: 143 mmol/L (ref 135–145)
TOTAL PROTEIN: 6.7 g/dL (ref 6.5–8.1)
Total Bilirubin: 0.4 mg/dL (ref 0.3–1.2)

## 2014-09-28 LAB — RHEUMATOID FACTOR: Rhuematoid fact SerPl-aCnc: 11.9 IU/mL (ref 0.0–13.9)

## 2014-09-28 LAB — GLUCOSE, CAPILLARY
Glucose-Capillary: 150 mg/dL — ABNORMAL HIGH (ref 65–99)
Glucose-Capillary: 177 mg/dL — ABNORMAL HIGH (ref 65–99)
Glucose-Capillary: 207 mg/dL — ABNORMAL HIGH (ref 65–99)

## 2014-09-28 LAB — SEDIMENTATION RATE: SED RATE: 82 mm/h — AB (ref 0–22)

## 2014-09-28 MED ORDER — HYDRALAZINE HCL 10 MG PO TABS
10.0000 mg | ORAL_TABLET | Freq: Three times a day (TID) | ORAL | Status: DC
  Administered 2014-09-28 (×2): 10 mg via ORAL
  Filled 2014-09-28 (×2): qty 1

## 2014-09-28 MED ORDER — PREDNISONE 20 MG PO TABS
50.0000 mg | ORAL_TABLET | Freq: Every day | ORAL | Status: DC
  Administered 2014-09-28: 50 mg via ORAL
  Filled 2014-09-28: qty 1
  Filled 2014-09-28: qty 2

## 2014-09-28 MED ORDER — PREDNISONE 50 MG PO TABS: 50.0000 mg | ORAL_TABLET | Freq: Every day | ORAL | Status: DC

## 2014-09-28 MED ORDER — HYDRALAZINE HCL 10 MG PO TABS: 10.0000 mg | ORAL_TABLET | Freq: Three times a day (TID) | ORAL | Status: DC

## 2014-09-28 NOTE — Progress Notes (Signed)
Report called to [facility]. MD coming in to discharge patient.

## 2014-09-28 NOTE — Discharge Instructions (Signed)
Please resume prior diet at SNF,  Nectar thick liquid, pureed

## 2014-09-29 LAB — PROTEIN ELECTROPHORESIS, SERUM
A/G Ratio: 0.7 (ref 0.7–1.7)
ALPHA-1-GLOBULIN: 0.3 g/dL (ref 0.0–0.4)
Albumin ELP: 2.4 g/dL — ABNORMAL LOW (ref 2.9–4.4)
Alpha-2-Globulin: 1.1 g/dL — ABNORMAL HIGH (ref 0.4–1.0)
BETA GLOBULIN: 1.1 g/dL (ref 0.7–1.3)
GLOBULIN, TOTAL: 3.6 g/dL (ref 2.2–3.9)
Gamma Globulin: 1.1 g/dL (ref 0.4–1.8)
Total Protein ELP: 6 g/dL (ref 6.0–8.5)

## 2014-09-29 LAB — ANTINUCLEAR ANTIBODIES, IFA: ANA Ab, IFA: NEGATIVE

## 2014-09-30 LAB — UIFE/LIGHT CHAINS/TP QN, 24-HR UR
% BETA, URINE: 7.5 %
ALPHA 1 URINE: 7 %
Albumin, U: 65.5 %
Alpha 2, Urine: 9.3 %
FREE KAPPA/LAMBDA RATIO: 7.54 (ref 2.04–10.37)
FREE LAMBDA LT CHAINS, UR: 43.5 mg/L — AB (ref 0.24–6.66)
Free Lt Chn Excr Rate: 328 mg/L — ABNORMAL HIGH (ref 1.35–24.19)
GAMMA GLOBULIN URINE: 10.8 %
TIME-UPE24: 24 h
Total Protein, Urine-Ur/day: 1975.1 mg/24 hr — ABNORMAL HIGH (ref 30.0–150.0)
Total Protein, Urine: 146.3 mg/dL
Volume, Urine: 1350 mL

## 2014-09-30 LAB — CULTURE, BLOOD (ROUTINE X 2)
Culture: NO GROWTH
Culture: NO GROWTH

## 2014-10-21 ENCOUNTER — Observation Stay (HOSPITAL_COMMUNITY): Payer: Medicare Other

## 2014-10-21 ENCOUNTER — Encounter (HOSPITAL_COMMUNITY): Payer: Self-pay | Admitting: Internal Medicine

## 2014-10-21 ENCOUNTER — Observation Stay (HOSPITAL_COMMUNITY)
Admission: EM | Admit: 2014-10-21 | Discharge: 2014-10-21 | Disposition: A | Discharge status: Home / Self Care | Payer: Medicare Other | Source: Other Acute Inpatient Hospital | Attending: Internal Medicine | Admitting: Internal Medicine

## 2014-10-21 DIAGNOSIS — G40909 Epilepsy, unspecified, not intractable, without status epilepticus: Secondary | ICD-10-CM

## 2014-10-21 DIAGNOSIS — Y828 Other medical devices associated with adverse incidents: Secondary | ICD-10-CM | POA: Diagnosis not present

## 2014-10-21 DIAGNOSIS — T8589XA Other specified complication of internal prosthetic devices, implants and grafts, not elsewhere classified, initial encounter: Secondary | ICD-10-CM | POA: Diagnosis not present

## 2014-10-21 DIAGNOSIS — D649 Anemia, unspecified: Secondary | ICD-10-CM | POA: Diagnosis not present

## 2014-10-21 DIAGNOSIS — K819 Cholecystitis, unspecified: Secondary | ICD-10-CM | POA: Diagnosis not present

## 2014-10-21 DIAGNOSIS — E119 Type 2 diabetes mellitus without complications: Secondary | ICD-10-CM | POA: Diagnosis not present

## 2014-10-21 DIAGNOSIS — N183 Chronic kidney disease, stage 3 (moderate): Secondary | ICD-10-CM | POA: Diagnosis not present

## 2014-10-21 DIAGNOSIS — E780 Pure hypercholesterolemia: Secondary | ICD-10-CM | POA: Diagnosis not present

## 2014-10-21 DIAGNOSIS — E039 Hypothyroidism, unspecified: Secondary | ICD-10-CM | POA: Diagnosis not present

## 2014-10-21 DIAGNOSIS — M316 Other giant cell arteritis: Secondary | ICD-10-CM | POA: Insufficient documentation

## 2014-10-21 DIAGNOSIS — Z885 Allergy status to narcotic agent status: Secondary | ICD-10-CM | POA: Diagnosis not present

## 2014-10-21 DIAGNOSIS — Z7982 Long term (current) use of aspirin: Secondary | ICD-10-CM | POA: Diagnosis not present

## 2014-10-21 DIAGNOSIS — K810 Acute cholecystitis: Secondary | ICD-10-CM | POA: Insufficient documentation

## 2014-10-21 DIAGNOSIS — I129 Hypertensive chronic kidney disease with stage 1 through stage 4 chronic kidney disease, or unspecified chronic kidney disease: Secondary | ICD-10-CM | POA: Diagnosis not present

## 2014-10-21 DIAGNOSIS — I10 Essential (primary) hypertension: Secondary | ICD-10-CM | POA: Diagnosis present

## 2014-10-21 DIAGNOSIS — F0390 Unspecified dementia without behavioral disturbance: Secondary | ICD-10-CM | POA: Diagnosis not present

## 2014-10-21 LAB — BASIC METABOLIC PANEL
Anion gap: 8 (ref 5–15)
BUN: 39 mg/dL — AB (ref 6–20)
CHLORIDE: 103 mmol/L (ref 101–111)
CO2: 30 mmol/L (ref 22–32)
Calcium: 8.4 mg/dL — ABNORMAL LOW (ref 8.9–10.3)
Creatinine, Ser: 1.36 mg/dL — ABNORMAL HIGH (ref 0.44–1.00)
GFR calc Af Amer: 39 mL/min — ABNORMAL LOW (ref >60)
GFR calc non Af Amer: 34 mL/min — ABNORMAL LOW (ref >60)
GLUCOSE: 123 mg/dL — AB (ref 65–99)
Potassium: 4.4 mmol/L (ref 3.5–5.1)
Sodium: 141 mmol/L (ref 135–145)

## 2014-10-21 LAB — HEPATIC FUNCTION PANEL
ALT: 64 U/L — ABNORMAL HIGH (ref 14–54)
AST: 33 U/L (ref 15–41)
Albumin: 2.9 g/dL — ABNORMAL LOW (ref 3.5–5.0)
Alkaline Phosphatase: 65 U/L (ref 38–126)
BILIRUBIN TOTAL: 0.3 mg/dL (ref 0.3–1.2)
TOTAL PROTEIN: 6.4 g/dL — AB (ref 6.5–8.1)

## 2014-10-21 LAB — CBC WITH DIFFERENTIAL/PLATELET
BASOS PCT: 0 % (ref 0–1)
Basophils Absolute: 0 10*3/uL (ref 0.0–0.1)
Eosinophils Absolute: 0.1 10*3/uL (ref 0.0–0.7)
Eosinophils Relative: 1 % (ref 0–5)
HCT: 33.6 % — ABNORMAL LOW (ref 36.0–46.0)
Hemoglobin: 10.8 g/dL — ABNORMAL LOW (ref 12.0–15.0)
LYMPHS ABS: 2.3 10*3/uL (ref 0.7–4.0)
LYMPHS PCT: 17 % (ref 12–46)
MCH: 30.8 pg (ref 26.0–34.0)
MCHC: 32.1 g/dL (ref 30.0–36.0)
MCV: 95.7 fL (ref 78.0–100.0)
MONOS PCT: 6 % (ref 3–12)
Monocytes Absolute: 0.8 10*3/uL (ref 0.1–1.0)
Neutro Abs: 10 10*3/uL — ABNORMAL HIGH (ref 1.7–7.7)
Neutrophils Relative %: 76 % (ref 43–77)
Platelets: 251 10*3/uL (ref 150–400)
RBC: 3.51 MIL/uL — AB (ref 3.87–5.11)
RDW: 14.8 % (ref 11.5–15.5)
WBC: 13.2 10*3/uL — ABNORMAL HIGH (ref 4.0–10.5)

## 2014-10-21 LAB — GLUCOSE, CAPILLARY
GLUCOSE-CAPILLARY: 187 mg/dL — AB (ref 65–99)
Glucose-Capillary: 87 mg/dL (ref 65–99)
Glucose-Capillary: 247 mg/dL — ABNORMAL HIGH (ref 65–99)

## 2014-10-21 LAB — LIPASE, BLOOD: LIPASE: 30 U/L (ref 22–51)

## 2014-10-21 LAB — CARBAMAZEPINE LEVEL, TOTAL: Carbamazepine Lvl: 4.7 ug/mL (ref 4.0–12.0)

## 2014-10-21 MED ORDER — POTASSIUM CHLORIDE ER 10 MEQ PO TBCR
10.0000 meq | EXTENDED_RELEASE_TABLET | Freq: Every day | ORAL | Status: DC
  Administered 2014-10-21: 10 meq via ORAL
  Filled 2014-10-21: qty 1

## 2014-10-21 MED ORDER — ACETAMINOPHEN 650 MG RE SUPP: 650.0000 mg | Freq: Four times a day (QID) | RECTAL | Status: DC | PRN

## 2014-10-21 MED ORDER — CALCIUM CARBONATE 1250 (500 CA) MG PO TABS
1250.0000 mg | ORAL_TABLET | Freq: Every day | ORAL | Status: DC
  Administered 2014-10-21: 1250 mg via ORAL
  Filled 2014-10-21 (×2): qty 1

## 2014-10-21 MED ORDER — BOOST PLUS PO LIQD
1.0000 | Freq: Every day | ORAL | Status: DC
  Administered 2014-10-21: 237 mL via ORAL
  Filled 2014-10-21: qty 237

## 2014-10-21 MED ORDER — ALBUTEROL SULFATE (2.5 MG/3ML) 0.083% IN NEBU: 2.5000 mg | INHALATION_SOLUTION | RESPIRATORY_TRACT | Status: DC | PRN

## 2014-10-21 MED ORDER — ONDANSETRON HCL 4 MG PO TABS: 4.0000 mg | ORAL_TABLET | Freq: Four times a day (QID) | ORAL | Status: DC | PRN

## 2014-10-21 MED ORDER — HYDRALAZINE HCL 20 MG/ML IJ SOLN: 5.0000 mg | INTRAMUSCULAR | Status: DC | PRN

## 2014-10-21 MED ORDER — SODIUM CHLORIDE 0.9 % IV SOLN
INTRAVENOUS | Status: DC
  Administered 2014-10-21: 02:00:00 via INTRAVENOUS

## 2014-10-21 MED ORDER — ASPIRIN 81 MG PO CHEW
81.0000 mg | CHEWABLE_TABLET | Freq: Every day | ORAL | Status: DC
  Administered 2014-10-21: 81 mg via ORAL

## 2014-10-21 MED ORDER — GLIMEPIRIDE 4 MG PO TABS
4.0000 mg | ORAL_TABLET | Freq: Every day | ORAL | Status: DC
  Administered 2014-10-21: 4 mg via ORAL
  Filled 2014-10-21: qty 1

## 2014-10-21 MED ORDER — ESCITALOPRAM OXALATE 10 MG PO TABS
10.0000 mg | ORAL_TABLET | Freq: Every day | ORAL | Status: DC
  Administered 2014-10-21: 10 mg via ORAL
  Filled 2014-10-21: qty 1

## 2014-10-21 MED ORDER — AMLODIPINE BESYLATE 10 MG PO TABS
10.0000 mg | ORAL_TABLET | Freq: Every day | ORAL | Status: DC
  Administered 2014-10-21: 10 mg via ORAL
  Filled 2014-10-21: qty 1

## 2014-10-21 MED ORDER — DONEPEZIL HCL 10 MG PO TABS
10.0000 mg | ORAL_TABLET | Freq: Every day | ORAL | Status: DC
  Administered 2014-10-21: 10 mg via ORAL
  Filled 2014-10-21: qty 1

## 2014-10-21 MED ORDER — HYDROCODONE-ACETAMINOPHEN 5-325 MG PO TABS: 1.0000 | ORAL_TABLET | Freq: Four times a day (QID) | ORAL | Status: DC | PRN

## 2014-10-21 MED ORDER — ALBUTEROL SULFATE (2.5 MG/3ML) 0.083% IN NEBU: 2.5000 mg | INHALATION_SOLUTION | Freq: Three times a day (TID) | RESPIRATORY_TRACT | Status: DC

## 2014-10-21 MED ORDER — ROSUVASTATIN CALCIUM 20 MG PO TABS
20.0000 mg | ORAL_TABLET | Freq: Every evening | ORAL | Status: DC
  Filled 2014-10-21: qty 1

## 2014-10-21 MED ORDER — PREDNISONE 50 MG PO TABS
50.0000 mg | ORAL_TABLET | Freq: Every day | ORAL | Status: DC
  Administered 2014-10-21: 50 mg via ORAL
  Filled 2014-10-21 (×2): qty 1

## 2014-10-21 MED ORDER — ONDANSETRON HCL 4 MG/2ML IJ SOLN: 4.0000 mg | Freq: Four times a day (QID) | INTRAMUSCULAR | Status: DC | PRN

## 2014-10-21 MED ORDER — PANTOPRAZOLE SODIUM 20 MG PO TBEC
20.0000 mg | DELAYED_RELEASE_TABLET | Freq: Every day | ORAL | Status: DC
  Administered 2014-10-21: 20 mg via ORAL
  Filled 2014-10-21: qty 1

## 2014-10-21 MED ORDER — ZOLPIDEM TARTRATE 5 MG PO TABS: 5.0000 mg | ORAL_TABLET | Freq: Every evening | ORAL | Status: DC | PRN

## 2014-10-21 MED ORDER — CARBAMAZEPINE 200 MG PO TABS
200.0000 mg | ORAL_TABLET | ORAL | Status: DC
  Administered 2014-10-21: 200 mg via ORAL
  Filled 2014-10-21 (×4): qty 1

## 2014-10-21 MED ORDER — PRO-STAT SUGAR FREE PO LIQD
30.0000 mL | Freq: Two times a day (BID) | ORAL | Status: DC
Start: 2014-10-21 — End: 2014-10-21
  Administered 2014-10-21: 30 mL via ORAL
  Filled 2014-10-21 (×2): qty 30

## 2014-10-21 MED ORDER — MEMANTINE HCL 10 MG PO TABS
10.0000 mg | ORAL_TABLET | Freq: Two times a day (BID) | ORAL | Status: DC
  Administered 2014-10-21: 10 mg via ORAL
  Filled 2014-10-21 (×2): qty 1

## 2014-10-21 MED ORDER — LOSARTAN POTASSIUM 50 MG PO TABS
100.0000 mg | ORAL_TABLET | Freq: Every day | ORAL | Status: DC
  Administered 2014-10-21: 100 mg via ORAL
  Filled 2014-10-21: qty 2

## 2014-10-21 MED ORDER — ALBUTEROL SULFATE (2.5 MG/3ML) 0.083% IN NEBU
2.5000 mg | INHALATION_SOLUTION | Freq: Three times a day (TID) | RESPIRATORY_TRACT | Status: DC
  Filled 2014-10-21: qty 3

## 2014-10-21 MED ORDER — BOOST HIGH PROTEIN PO LIQD
1.0000 | Freq: Every day | ORAL | Status: DC
  Administered 2014-10-21: 237 mL via ORAL
  Filled 2014-10-21: qty 237

## 2014-10-21 MED ORDER — SODIUM CHLORIDE 0.9 % IJ SOLN
10.0000 mL | INTRAMUSCULAR | Status: DC | PRN
  Administered 2014-10-21 (×2): 10 mL
  Filled 2014-10-21 (×2): qty 40

## 2014-10-21 MED ORDER — ACETAMINOPHEN 325 MG PO TABS: 650.0000 mg | ORAL_TABLET | Freq: Four times a day (QID) | ORAL | Status: DC | PRN

## 2014-10-21 MED ORDER — FE FUMARATE-B12-VIT C-FA-IFC PO CAPS
1.0000 | ORAL_CAPSULE | Freq: Every day | ORAL | Status: DC
Start: 2014-10-21 — End: 2014-10-21
  Administered 2014-10-21: 1 via ORAL
  Filled 2014-10-21: qty 1

## 2014-10-21 MED ORDER — GUAIFENESIN ER 600 MG PO TB12: 600.0000 mg | ORAL_TABLET | Freq: Every day | ORAL | Status: DC | PRN

## 2014-10-21 MED ORDER — BOOST PLUS PO LIQD: 1.0000 | Freq: Every day | ORAL | Status: AC

## 2014-10-21 MED ORDER — ATENOLOL 50 MG PO TABS
50.0000 mg | ORAL_TABLET | Freq: Every day | ORAL | Status: DC
  Administered 2014-10-21: 50 mg via ORAL
  Filled 2014-10-21: qty 1

## 2014-10-21 MED ORDER — HEPARIN SOD (PORK) LOCK FLUSH 100 UNIT/ML IV SOLN
250.0000 [IU] | INTRAVENOUS | Status: AC | PRN
  Administered 2014-10-21 (×2): 250 [IU]

## 2014-10-21 MED ORDER — INSULIN ASPART 100 UNIT/ML ~~LOC~~ SOLN
0.0000 [IU] | Freq: Three times a day (TID) | SUBCUTANEOUS | Status: DC
  Administered 2014-10-21: 3 [IU] via SUBCUTANEOUS

## 2014-10-21 MED ORDER — LEVOTHYROXINE SODIUM 50 MCG PO TABS
50.0000 ug | ORAL_TABLET | Freq: Every day | ORAL | Status: DC
  Administered 2014-10-21: 50 ug via ORAL
  Filled 2014-10-21 (×2): qty 1

## 2014-10-21 NOTE — Progress Notes (Signed)
Initial Nutrition Assessment  DOCUMENTATION CODES:  Not applicable  INTERVENTION: - Advancement of diet back to Heart Healthy/Carb Modified as medically feasible - Continue Prostat BID, each supplement provides 100 kcal and 15 grams of protein - Continue Boost Plus once/day, this provides 360 kcal and 14 grams of protein - RD will continue to monitor for needs  NUTRITION DIAGNOSIS:  Inadequate oral intake related to inability to eat as evidenced by NPO status.  GOAL:  Patient will meet greater than or equal to 90% of their needs  MONITOR:  Diet advancement, PO intake, Supplement acceptance, Weight trends, Labs, I & O's  REASON FOR ASSESSMENT:  Malnutrition Screening Tool  ASSESSMENT: 81 y.o. female with history of diabetes mellitus type 2, chronic kidney disease stage III, hypertension, hypothyroidism, seizure disorder, recently diagnosed giant cell arteritis on steroids was brought to the ER at [facility] after patient's family found that patient's biliary drain placed for acute cholecystitis in May 2016 was not flushing.   Pt seen for MST. BMI indicates normal weight status. Pt has been NPO since this morning and is now s/p radiology procedure related to cholecystoostomy tube dysfunction. No muscle or fat wasting noted.  Pt had good appetite PTA with no abdominal pain, nausea or vomiting with intakes PTA. Per weight hx review, pt lost 8 lbs (6% body weight) in the past moth after a gain of 6 lbs.  Unable to meet needs at this time but supplements were ordered prior to NPO status; will monitor for intakes of these with diet advancement. Medications reviewed. Labs reviewed; BUN/creatinine elevated, Ca: 8.4 mg/dL, GFR: 39.  Diet Order:  Diet NPO time specified  Skin:  Reviewed, no issues  Last BM:  PTA  Height:  Ht Readings from Last 1 Encounters:  10/21/14 5\' 2"  (1.575 m)    Weight:  Wt Readings from Last 1 Encounters:  10/21/14 128 lb 1.6 oz  (58.106 kg)    Ideal Body Weight:  50 kg (kg)  Wt Readings from Last 10 Encounters:  10/21/14 128 lb 1.6 oz (58.106 kg)  09/28/14 128 lb 4.9 oz (58.2 kg)  09/26/14 129 lb (58.514 kg)  09/17/14 136 lb 7.4 oz (61.9 kg)  09/09/14 130 lb 12.8 oz (59.33 kg)  03/12/14 143 lb 4.8 oz (65 kg)    BMI:  Body mass index is 23.42 kg/(m^2).  Estimated Nutritional Needs:  Kcal:  1200-1400  Protein:  55-65 grams  Fluid:  2.2 L/day  EDUCATION NEEDS:  No education needs identified at this time     Jarome Matin, RD, LDN Inpatient Clinical Dietitian Pager # [phone] After hours/weekend pager # [phone]

## 2014-10-21 NOTE — Discharge Summary (Signed)
Physician Discharge Summary  Tina Hunter RSW:[id number] DOB: Aug 17, 1924 DOA: 10/21/2014  PCP: No PCP Per Patient  Admit date: 10/21/2014 Discharge date: 10/21/2014  Time spent: 25 minutes  Recommendations for Outpatient Follow-up:  1. Follow up with PCP as needed.   Discharge Diagnoses:  Principal Problem:   Diabetes mellitus type 2, controlled Active Problems:   Seizure disorder   Essential hypertension   Dementia Non functioning biliary drain.   Discharge Condition: improved.   Diet recommendation: carb modified diet.   Filed Weights   10/21/14 0200  Weight: 58.106 kg (128 lb 1.6 oz)    History of present illness:   Tina Hunter is a 81 y.o. female with history of diabetes mellitus type 2, chronic kidney disease stage III, hypertension, hypothyroidism, seizure disorder, recently diagnosed giant cell arteritis on steroids was brought to the ER at [facility] after patient's family found that patient's biliary drain placed for acute cholecystitis in May 2016 was not flushing. Hospital Course:  1. Nonflushing biliary drain placed for cholecystitis in May 2016 -  discussed with Dr. Harlow Asa on-call surgeon. IR drain study performed and there was a small patent channel between the drain and GB. The gb, cystic dudct and CBD filled with contrast and contrast drained into duodenum and the drain was completely removed.  2. Diabetes mellitus type 2 - continue home medications with sliding scale coverage. 3. Hypertension - continue home medications. 4. Recently diagnosed giant cell arteritis - on prednisone. 5. History of seizures - on Tegretol.  6. Chronic kidney disease stage III - creatinine appears to be at baseline. 7. Hypothyroidism - continue Synthroid. 8. Chronic anemia - stable 9. Dementia - continue present medications.  Procedures:  REMOVAL OF DRAIN.  Consultations:  Surgery  IR  Discharge Exam: Filed Vitals:   10/21/14 1230  BP: 168/66  Pulse:  71  Temp: 97.6 F (36.4 C)  Resp: 18    General: alert comfortable Cardiovascular: s1s2 Respiratory: ctab  Discharge Instructions   Discharge Instructions    Diet - low sodium heart healthy    Complete by:  As directed      Discharge instructions    Complete by:  As directed   Follow up with PCP as recommended.          Current Discharge Medication List    CONTINUE these medications which have CHANGED   Details  !! lactose free nutrition (BOOST PLUS) LIQD Take 237 mLs by mouth daily. Refills: 0     !! - Potential duplicate medications found. Please discuss with provider.    CONTINUE these medications which have NOT CHANGED   Details  acetaminophen (TYLENOL) 650 MG CR tablet Take 650 mg by mouth every 4 (four) hours as needed for pain.    albuterol (PROVENTIL) (2.5 MG/3ML) 0.083% nebulizer solution Take 3 mLs (2.5 mg total) by nebulization every 8 (eight) hours. Qty: 75 mL, Refills: 12    Amino Acids-Protein Hydrolys (FEEDING SUPPLEMENT, PRO-STAT SUGAR FREE 64,) LIQD Take 30 mLs by mouth 2 (two) times daily.    amLODipine (NORVASC) 10 MG tablet Take 10 mg by mouth daily.    aspirin (ASPIRIN CHILDRENS) 81 MG chewable tablet Chew 1 tablet (81 mg total) by mouth daily.    atenolol (TENORMIN) 50 MG tablet Take 50 mg by mouth daily.    calcium carbonate (CALCIUM 600) 600 MG TABS tablet Take 600 mg by mouth daily.    carbamazepine (TEGRETOL) 200 MG tablet Take 200 mg by mouth 3 (three)  times daily. 8am, 4pm, 12 am.    donepezil (ARICEPT) 10 MG tablet Take 10 mg by mouth daily.    escitalopram (LEXAPRO) 10 MG tablet Take 10 mg by mouth daily.    Fe Fum-FA-B Cmp-C-Zn-Mg-Mn-Cu (HEMATINIC PLUS COMPLEX) 106-1 MG TABS Take 1 tablet by mouth daily.    !! feeding supplement (BOOST HIGH PROTEIN) LIQD Take 1 Container by mouth daily.    glimepiride (AMARYL) 4 MG tablet Take 4 mg by mouth daily.    guaiFENesin (MUCINEX) 600 MG 12 hr tablet Take 600 mg by mouth daily as  needed (for congestion).    HYDROcodone-acetaminophen (NORCO/VICODIN) 5-325 MG per tablet Take 1 tablet by mouth every 6 (six) hours as needed for moderate pain.    levothyroxine (SYNTHROID, LEVOTHROID) 50 MCG tablet Take 50 mcg by mouth daily before breakfast.    losartan (COZAAR) 100 MG tablet Take 100 mg by mouth daily.    memantine (NAMENDA) 10 MG tablet Take 1 tablet (10 mg total) by mouth daily.    OXYGEN Inhale 2 L into the lungs daily.    pantoprazole (PROTONIX) 20 MG tablet Take 20 mg by mouth daily.    potassium chloride (K-DUR) 10 MEQ tablet Take 10 mEq by mouth daily.    predniSONE (DELTASONE) 10 MG tablet Take 50 mg by mouth daily with breakfast.    rosuvastatin (CRESTOR) 20 MG tablet Take 0.5 tablets (10 mg total) by mouth every evening. Qty: 30 tablet, Refills: 1    zolpidem (AMBIEN) 10 MG tablet Take 10 mg by mouth at bedtime as needed for sleep.      !! - Potential duplicate medications found. Please discuss with provider.    STOP taking these medications     hydrALAZINE (APRESOLINE) 10 MG tablet      levofloxacin (LEVAQUIN) 500 MG/100ML SOLN      piperacillin-tazobactam 2.25 g in dextrose 5 % 50 mL      vancomycin 500 mg in sodium chloride 0.9 % 100 mL        Allergies  Allergen Reactions  . Codeine Nausea And Vomiting      The results of significant diagnostics from this hospitalization (including imaging, microbiology, ancillary and laboratory) are listed below for reference.    Significant Diagnostic Studies: Dg Chest 1 View  09/25/2014   CLINICAL DATA:  Altered mental status come recent seizure activity  EXAM: CHEST  1 VIEW  COMPARISON:  09/15/2014  FINDINGS: Cardiac shadow is stable. A right-sided PICC line is again noted in satisfactory position. Improved aeration in the right lung base is noted although some persistent changes are seen. No new focal infiltrate is noted. No bony abnormality is seen.  IMPRESSION: Improved aeration in the right  base with some residual infiltrate identified.   Electronically Signed   By: Inez Catalina M.D.   On: 09/25/2014 22:00   Ct Head Wo Contrast  09/25/2014   CLINICAL DATA:  Altered mental status  EXAM: CT HEAD WITHOUT CONTRAST  TECHNIQUE: Contiguous axial images were obtained from the base of the skull through the vertex without intravenous contrast.  COMPARISON:  09/05/2014  FINDINGS: Postsurgical changes are again identified on the right. The bony calvarium is otherwise intact. Diffuse atrophic changes and chronic white matter ischemic change are seen and stable. No findings to suggest acute hemorrhage, acute infarction or space-occupying mass lesion are noted. Old lacunar infarcts are noted in the basal ganglia on the left.  IMPRESSION: Chronic atrophic and ischemic changes. No significant interval change  from the prior exam.   Electronically Signed   By: Inez Catalina M.D.   On: 09/25/2014 21:56   Mr Lumbar Spine Wo Contrast  09/26/2014   CLINICAL DATA:  Leukocytosis.  Spinal infection.  Bacteremia.  EXAM: MRI LUMBAR SPINE WITHOUT CONTRAST  TECHNIQUE: Multiplanar, multisequence MR imaging of the lumbar spine was performed. No intravenous contrast was administered.  COMPARISON:  CT abdomen 09/15/2014.  FINDINGS: Segmentation: The numbering convention used for this exam termed L5-S1 as the last intervertebral disc space. 5 lumbar type vertebral bodies on prior CT.  Alignment: 2 mm anterolisthesis of L4 on L5 appears degenerative and facet mediated.  Vertebrae: Bone marrow signal shows heterogenous marrow. This is a nonspecific finding most commonly associated with obesity, anemia, cigarette smoking or chronic disease. Large hemangiomata are present in the L1 and L5 vertebral bodies. No aggressive features. No compression fracture. There is also a partially visible benign T11 vertebral body hemangioma.  Conus medullaris: Normal termination at T12-L1.  Paraspinal tissues: Bilateral renal cysts. Susceptibility  artifact associated with IVC filter. Cystic adnexal lesions are present on the LEFT, likely representing ovarian cyst seen on prior CT.  Disc levels:  Disc Signal: Age expected disc degeneration and desiccation. Vacuum disc was identified on prior CT at L4-L5.  T11-T12: Sagittal imaging.  Disc desiccation.  T12-L1:  Mild RIGHT eccentric disc bulging without stenosis.  L1-L2:  Shallow broad-based disc bulging without stenosis.  L2-L3: Minimal disc bulging without stenosis. This is RIGHT eccentric.  L3-L4:  Shallow disc bulging without stenosis.  L4-L5: Posterior disc bulging and posterior ligamentum flavum redundancy. This produces mild central stenosis. LEFT-greater-than- RIGHT mild bilateral facet arthrosis. Both neural foramina appear adequately patent. Subarticular zones patent.  L5-S1:  Negative.  There are no findings to suggest septic arthritis or discitis/osteomyelitis.  IMPRESSION: No evidence of spinal infection. L4-L5 mild central stenosis. Mild L4-L5 grade I anterolisthesis associated with degenerative disc and facet disease.   Electronically Signed   By: Dereck Ligas M.D.   On: 09/26/2014 12:45   US Abdomen Limited  09/26/2014   CLINICAL DATA:  Leukocytosis, post cholecystostomy tube placement, history diabetes mellitus, hypertension, sepsis due to UTI  EXAM: US ABDOMEN LIMITED - RIGHT UPPER QUADRANT  COMPARISON:  CT abdomen pelvis 09/15/2014  FINDINGS: Gallbladder:  Decompressed by cholecystostomy tube, collapsed. Wall appears prominent though this is likely an artifact related to collapsed state. No definite pericholecystic fluid or shadowing calculi. No sonographic Murphy sign.  Common bile duct:  Diameter: 6 mm diameter, normal for age  Liver:  Grossly normal appearance.  No RIGHT upper quadrant free fluid.  IMPRESSION: Decompressed gallbladder containing cholecystostomy tube.  Otherwise negative exam.   Electronically Signed   By: Lavonia Dana M.D.   On: 09/26/2014 10:46    Microbiology: No  results found for this or any previous visit (from the past 240 hour(s)).   Labs: Basic Metabolic Panel:  Recent Labs Lab 10/21/14 0245  NA 141  K 4.4  CL 103  CO2 30  GLUCOSE 123*  BUN 39*  CREATININE 1.36*  CALCIUM 8.4*   Liver Function Tests:  Recent Labs Lab 10/21/14 0245  AST 33  ALT 64*  ALKPHOS 65  BILITOT 0.3  PROT 6.4*  ALBUMIN 2.9*    Recent Labs Lab 10/21/14 0245  LIPASE 30   No results for input(s): AMMONIA in the last 168 hours. CBC:  Recent Labs Lab 10/21/14 0245  WBC 13.2*  NEUTROABS 10.0*  HGB 10.8*  HCT 33.6*  MCV 95.7  PLT 251   Cardiac Enzymes: No results for input(s): CKTOTAL, CKMB, CKMBINDEX, TROPONINI in the last 168 hours. BNP: BNP (last 3 results)  Recent Labs  09/25/14 2117  BNP 47.0    ProBNP (last 3 results) No results for input(s): PROBNP in the last 8760 hours.  CBG:  Recent Labs Lab 10/21/14 0731 10/21/14 1136  GLUCAP 87 247*       Signed:  Naome Brigandi  [facility] 10/21/2014, 3:55 PM

## 2014-10-21 NOTE — Care Management Note (Signed)
Case Management Note  Patient Details  Name: Tina Hunter MRN: [id number] Date of Birth: [birth year]  Subjective/Objective:  [facility]  Tel#[phone], faxed w/confirmation d/c summary,h&p, HHC orders.Ambulance transp-CSW already following.                Action/Plan:No further d/c needs.   Expected Discharge Date:                  Expected Discharge Plan:  [place]  In-House Referral:  Clinical Social Work  Discharge planning Services  CM Consult  Post Acute Care Choice:  Home Health Choice offered to:  Adult Children  DME Arranged:    DME Agency:     HH Arranged:  RN, PT, OT, Nurse's Aide, Speech Therapy HH Agency:  [facility]  Status of Service:  Completed, signed off  Medicare Important Message Given:    Date Medicare IM Given:    Medicare IM give by:    Date Additional Medicare IM Given:    Additional Medicare Important Message give by:     If discussed at [place] of Stay Meetings, dates discussed:    Additional Comments:  Dessa Phi, RN 10/21/2014, 4:09 PM

## 2014-10-21 NOTE — H&P (Signed)
[facility] History and Physical  Tina Hunter ION:[id number] DOB: 05/09/1924 DOA: 10/21/2014  Referring physician: Patient was transferred from [facility]. PCP: No PCP Per Patient Dr. Jani Gravel. Specialists: None.  Chief Complaint: Biliary drain not flushing.  HPI: Tina Hunter is a 81 y.o. female with history of diabetes mellitus type 2, chronic kidney disease stage III, hypertension, hypothyroidism, seizure disorder, recently diagnosed giant cell arteritis on steroids was brought to the ER at [facility] after patient's family found that patient's biliary drain placed for acute cholecystitis in May 2016 was not flushing. At [facility] patient had basic labs and CT abdomen done which did not show anything acute. Since patient had drain placed had [facility] on-call surgeon Dr. Harlow Asa was consulted and patient was transferred to [facility] for further management. On my exam patient is not in acute distress and has not had any nausea vomiting abdominal pain diarrhea chest pain or shortness of breath as per the patient's daughter.   Review of Systems: As presented in the history of presenting illness, rest negative.  Past Medical History  Diagnosis Date  . Stroke   . Diabetes mellitus without complication   . Hypertension   . High cholesterol   . Brain tumor (benign)   . Chronic bronchitis   . Seizures   . Bacteremia due to vancomycin resistant Enterococcus 08/25/2014  . Sepsis secondary to UTI 08/25/2014    and acute cholecystitis/VRE bacteremia.  . Cholecystitis, acute 08/25/2014   Past Surgical History  Procedure Laterality Date  . Brain surgery    . Abdominal hysterectomy    . Ct perc cholecystostomy     Social History:  reports that she has never smoked. She does not have any smokeless tobacco history on file. She reports that she does not drink alcohol or use illicit drugs. Where does patient live  home. Can patient participate in ADLs? No.  Allergies  Allergen Reactions  . Codeine Nausea And Vomiting    Family History:  Family History  Problem Relation Age of Onset  . Diabetes Sister       Prior to Admission medications   Medication Sig Start Date End Date Taking? Authorizing Provider  acetaminophen (TYLENOL) 650 MG CR tablet Take 650 mg by mouth every 4 (four) hours as needed for pain.   Yes Historical Provider, MD  albuterol (PROVENTIL) (2.5 MG/3ML) 0.083% nebulizer solution Take 3 mLs (2.5 mg total) by nebulization every 8 (eight) hours. 09/27/14  Yes Jani Gravel, MD  Amino Acids-Protein Hydrolys (FEEDING SUPPLEMENT, PRO-STAT SUGAR FREE 64,) LIQD Take 30 mLs by mouth 2 (two) times daily.   Yes Historical Provider, MD  amLODipine (NORVASC) 10 MG tablet Take 10 mg by mouth daily.   Yes Historical Provider, MD  aspirin (ASPIRIN CHILDRENS) 81 MG chewable tablet Chew 1 tablet (81 mg total) by mouth daily. 09/09/14  Yes Rexene Alberts, MD  atenolol (TENORMIN) 50 MG tablet Take 50 mg by mouth daily.   Yes Historical Provider, MD  calcium carbonate (CALCIUM 600) 600 MG TABS tablet Take 600 mg by mouth daily.   Yes Historical Provider, MD  carbamazepine (TEGRETOL) 200 MG tablet Take 200 mg by mouth 3 (three) times daily. 8am, 4pm, 12 am.   Yes Historical Provider, MD  donepezil (ARICEPT) 10 MG tablet Take 10 mg by mouth daily. 10/01/14  Yes Historical Provider, MD  escitalopram (LEXAPRO) 10 MG tablet Take 10 mg by mouth daily.   Yes Historical Provider, MD  Fe Fum-FA-B Cmp-C-Zn-Mg-Mn-Cu (HEMATINIC PLUS COMPLEX) 106-1 MG TABS Take 1 tablet by mouth daily.   Yes Historical Provider, MD  feeding supplement (BOOST HIGH PROTEIN) LIQD Take 1 Container by mouth daily.   Yes Historical Provider, MD  glimepiride (AMARYL) 4 MG tablet Take 4 mg by mouth daily. 10/01/14  Yes Historical Provider, MD  guaiFENesin (MUCINEX) 600 MG 12 hr tablet Take 600 mg by mouth daily as needed (for congestion).   Yes  Historical Provider, MD  HYDROcodone-acetaminophen (NORCO/VICODIN) 5-325 MG per tablet Take 1 tablet by mouth every 6 (six) hours as needed for moderate pain.   Yes Historical Provider, MD  levothyroxine (SYNTHROID, LEVOTHROID) 50 MCG tablet Take 50 mcg by mouth daily before breakfast.   Yes Historical Provider, MD  losartan (COZAAR) 100 MG tablet Take 100 mg by mouth daily.   Yes Historical Provider, MD  memantine (NAMENDA) 10 MG tablet Take 1 tablet (10 mg total) by mouth daily. Patient taking differently: Take 10 mg by mouth 2 (two) times daily.  09/09/14  Yes Rexene Alberts, MD  OXYGEN Inhale 2 L into the lungs daily.   Yes Historical Provider, MD  pantoprazole (PROTONIX) 20 MG tablet Take 20 mg by mouth daily.   Yes Historical Provider, MD  potassium chloride (K-DUR) 10 MEQ tablet Take 10 mEq by mouth daily. 10/15/14  Yes Historical Provider, MD  predniSONE (DELTASONE) 10 MG tablet Take 50 mg by mouth daily with breakfast.   Yes Historical Provider, MD  rosuvastatin (CRESTOR) 20 MG tablet Take 0.5 tablets (10 mg total) by mouth every evening. Patient taking differently: Take 20 mg by mouth every evening.  09/18/14  Yes Jani Gravel, MD  zolpidem (AMBIEN) 10 MG tablet Take 10 mg by mouth at bedtime as needed for sleep.  10/08/14  Yes Historical Provider, MD  atenolol (TENORMIN) 25 MG tablet Take 0.5 tablets (12.5 mg total) by mouth every evening. Patient not taking: Reported on 10/21/2014 09/18/14   Jani Gravel, MD  hydrALAZINE (APRESOLINE) 10 MG tablet Take 1 tablet (10 mg total) by mouth every 8 (eight) hours. Patient not taking: Reported on 10/21/2014 09/28/14   Jani Gravel, MD  levofloxacin (LEVAQUIN) 500 MG/100ML SOLN Inject 100 mLs (500 mg total) into the vein every other day. Patient not taking: Reported on 10/21/2014 09/27/14   Jani Gravel, MD  piperacillin-tazobactam 2.25 g in dextrose 5 % 50 mL Inject 2.25 g into the vein every 8 (eight) hours. Patient not taking: Reported on 10/21/2014 09/27/14   Jani Gravel, MD  predniSONE (DELTASONE) 50 MG tablet Take 1 tablet (50 mg total) by mouth daily with breakfast. Patient not taking: Reported on 10/21/2014 09/28/14   Jani Gravel, MD  vancomycin 500 mg in sodium chloride 0.9 % 100 mL Inject 500 mg into the vein daily. Patient not taking: Reported on 10/21/2014 09/27/14   Jani Gravel, MD    Physical Exam: Filed Vitals:   10/21/14 0200  BP: 175/57  Pulse: 63  Temp: 97.8 F (36.6 C)  TempSrc: Axillary  Resp: 20  Height: 5\' 2"  (1.575 m)  Weight: 58.106 kg (128 lb 1.6 oz)  SpO2: 100%     General:  Moderately built and nourished.  Eyes: Anicteric no pallor.  ENT: No discharge from ears eyes nose or mouth.  Neck: No mass felt.  Cardiovascular: S1-S2 heard.  Respiratory: No rhonchi or crepitations.  Abdomen: Biliary drain seen. Soft nontender bowel sounds present.  Skin: No rash.  Musculoskeletal: No edema.  Psychiatric: Patient has  dementia.  Neurologic: Patient has dementia. Moves all extremities.  Labs on Admission:  Basic Metabolic Panel:  Recent Labs Lab 10/21/14 0245  NA 141  K 4.4  CL 103  CO2 30  GLUCOSE 123*  BUN 39*  CREATININE 1.36*  CALCIUM 8.4*   Liver Function Tests:  Recent Labs Lab 10/21/14 0245  AST 33  ALT 64*  ALKPHOS 65  BILITOT 0.3  PROT 6.4*  ALBUMIN 2.9*    Recent Labs Lab 10/21/14 0245  LIPASE 30   No results for input(s): AMMONIA in the last 168 hours. CBC:  Recent Labs Lab 10/21/14 0245  WBC 13.2*  NEUTROABS 10.0*  HGB 10.8*  HCT 33.6*  MCV 95.7  PLT 251   Cardiac Enzymes: No results for input(s): CKTOTAL, CKMB, CKMBINDEX, TROPONINI in the last 168 hours.  BNP (last 3 results)  Recent Labs  09/25/14 2117  BNP 47.0    ProBNP (last 3 results) No results for input(s): PROBNP in the last 8760 hours.  CBG: No results for input(s): GLUCAP in the last 168 hours.  Radiological Exams on Admission: No results found.    Assessment/Plan Principal Problem:    Diabetes mellitus type 2, controlled Active Problems:   Seizure disorder   Essential hypertension   Dementia   1. Nonflushing biliary drain placed for cholecystitis in May 2016 - I have discussed with Dr. Harlow Asa on-call surgeon who will be seeing patient in consult for further recommendations for patients biliary drain. 2. Diabetes mellitus type 2 - continue home medications with sliding scale coverage. 3. Hypertension - continue home medications and I have placed patient on when necessary IV hydralazine for systolic blood pressure more than 160. 4. Recently diagnosed giant cell arteritis - on prednisone. 5. History of seizures - on Tegretol. Tegretol levels are pending. 6. Chronic kidney disease stage III - creatinine appears to be at baseline. 7. Hypothyroidism - continue Synthroid. 8. Chronic anemia - follow CBC. 9. Dementia - continue present medications.   DVT Prophylaxis SCDs.  Code Status: Full code.  Family Communication: Discussed with patient's daughter.  Disposition Plan: Admit for observation.    Brittin Janik N. [facility] Pager [phone].  If 7PM-7AM, please contact night-coverage [email or web address] Password [facility] 10/21/2014, 4:34 AM

## 2014-10-21 NOTE — Procedures (Signed)
The cholecystostomy tube was outside of gallbladder based on [place] and fluoroscopy.  However, there was a small patent channel between the drain and gallbladder.  The gallbladder, cystic duct and CBD filled with contrast.  Contrast drained into the duodenum.  The drain was completely removed.

## 2014-10-21 NOTE — Progress Notes (Signed)
Patient ID: Tina Hunter, female   DOB: 02-Apr-1925, 81 y.o.   MRN: [id number]     [address] [place], [place] [address]    Phone: [phone] FAX: [phone]     Subjective: Tina Hunter is a 81 year old female with a history of CVA with left sided hemiparesis, HTN, seizure disorder, anemia of chronic disease, hypothyroidism, DM II and giant cell arteritis who was hospitalized at [facility] 5/16-5/31 with sepsis, E coli UTI, AKI and acute cholecystitis which was treated with a cholecystostomy tube and followed by Dr. Arnoldo Morale. she is known to our service for subsequent hospitalization at which time her tube was malfunctioning, however, spontaneously started working again.  According to her daughters she has been seen in ED several times for the same issue.  She has home health, but has been getting ambiguous information regarding how to flush it and which way to turn to stop cock.  The family denies pain, nausea or vomiting.  She is eating.  Apparently the tube was exchanged in [place] last Tuesday because it was leaking as it was being flushed.    The patient presented to [facility] ED yesterday due to biliary drain not flushing and was transferred here which I dont quite understand given her surgeon is at [facility].  Apparently she had a CT scan of a/p which was normal.  LFTs are essentially normal, normal lipase and bilirubin.    Objective:  Vital signs:  Filed Vitals:   10/21/14 0200 10/21/14 0546  BP: 175/57 191/69  Pulse: 63 65  Temp: 97.8 F (36.6 C) 97.7 F (36.5 C)  TempSrc: Axillary Oral  Resp: 20 20  Height: 5' 2"  (1.575 m)   Weight: 58.106 kg (128 lb 1.6 oz)   SpO2: 100% 97%       Intake/Output   Yesterday:    This shift:    Physical Exam: General: Pt asleep in fetal position.  Abdomen: Soft.  Nondistended.  Non tender. RUQ drain with bilious output.  No evidence of peritonitis.  No incarcerated  hernias.    Problem List:   Principal Problem:   Diabetes mellitus type 2, controlled Active Problems:   Seizure disorder   Essential hypertension   Dementia    Results:   Labs: Results for orders placed or performed during the hospital encounter of 10/21/14 (from the past 48 hour(s))  Basic metabolic panel     Status: Abnormal   Collection Time: 10/21/14  2:45 AM  Result Value Ref Range   Sodium 141 135 - 145 mmol/L   Potassium 4.4 3.5 - 5.1 mmol/L   Chloride 103 101 - 111 mmol/L   CO2 30 22 - 32 mmol/L   Glucose, Bld 123 (H) 65 - 99 mg/dL   BUN 39 (H) 6 - 20 mg/dL   Creatinine, Ser 1.36 (H) 0.44 - 1.00 mg/dL   Calcium 8.4 (L) 8.9 - 10.3 mg/dL   GFR calc non Af Amer 34 (L) >60 mL/min   GFR calc Af Amer 39 (L) >60 mL/min    Comment: (NOTE) The eGFR has been calculated using the CKD EPI equation. This calculation has not been validated in all clinical situations. eGFR's persistently <60 mL/min signify possible Chronic Kidney Disease.    Anion gap 8 5 - 15  CBC with Differential/Platelet     Status: Abnormal   Collection Time: 10/21/14  2:45 AM  Result Value Ref Range  WBC 13.2 (H) 4.0 - 10.5 K/uL   RBC 3.51 (L) 3.87 - 5.11 MIL/uL   Hemoglobin 10.8 (L) 12.0 - 15.0 g/dL   HCT 33.6 (L) 36.0 - 46.0 %   MCV 95.7 78.0 - 100.0 fL   MCH 30.8 26.0 - 34.0 pg   MCHC 32.1 30.0 - 36.0 g/dL   RDW 14.8 11.5 - 15.5 %   Platelets 251 150 - 400 K/uL   Neutrophils Relative % 76 43 - 77 %   Neutro Abs 10.0 (H) 1.7 - 7.7 K/uL   Lymphocytes Relative 17 12 - 46 %   Lymphs Abs 2.3 0.7 - 4.0 K/uL   Monocytes Relative 6 3 - 12 %   Monocytes Absolute 0.8 0.1 - 1.0 K/uL   Eosinophils Relative 1 0 - 5 %   Eosinophils Absolute 0.1 0.0 - 0.7 K/uL   Basophils Relative 0 0 - 1 %   Basophils Absolute 0.0 0.0 - 0.1 K/uL  Hepatic function panel     Status: Abnormal   Collection Time: 10/21/14  2:45 AM  Result Value Ref Range   Total Protein 6.4 (L) 6.5 - 8.1 g/dL   Albumin 2.9 (L) 3.5 -  5.0 g/dL   AST 33 15 - 41 U/L   ALT 64 (H) 14 - 54 U/L   Alkaline Phosphatase 65 38 - 126 U/L   Total Bilirubin 0.3 0.3 - 1.2 mg/dL   Bilirubin, Direct <0.1 (L) 0.1 - 0.5 mg/dL   Indirect Bilirubin NOT CALCULATED 0.3 - 0.9 mg/dL  Lipase, blood     Status: None   Collection Time: 10/21/14  2:45 AM  Result Value Ref Range   Lipase 30 22 - 51 U/L  Carbamazepine level, total     Status: None   Collection Time: 10/21/14  6:40 AM  Result Value Ref Range   Carbamazepine Lvl 4.7 4.0 - 12.0 ug/mL    Comment: Performed at [facility]  Glucose, capillary     Status: None   Collection Time: 10/21/14  7:31 AM  Result Value Ref Range   Glucose-Capillary 87 65 - 99 mg/dL   Comment 1 Notify RN     Imaging / Studies: No results found.  Medications / Allergies:  Scheduled Meds: . amLODipine  10 mg Oral Daily  . aspirin  81 mg Oral Daily  . atenolol  50 mg Oral Daily  . calcium carbonate  1,250 mg Oral Q breakfast  . carbamazepine  200 mg Oral 3 times per day  . donepezil  10 mg Oral Daily  . escitalopram  10 mg Oral Daily  . feeding supplement  1 Container Oral Daily  . feeding supplement (PRO-STAT SUGAR FREE 64)  30 mL Oral BID  . ferrous [id number] C-folic acid  1 capsule Oral Daily  . glimepiride  4 mg Oral Daily  . insulin aspart  0-9 Units Subcutaneous TID WC  . lactose free nutrition  1 Container Oral Daily  . levothyroxine  50 mcg Oral QAC breakfast  . losartan  100 mg Oral Daily  . memantine  10 mg Oral BID  . pantoprazole  20 mg Oral Daily  . potassium chloride  10 mEq Oral Daily  . predniSONE  50 mg Oral Q breakfast  . rosuvastatin  20 mg Oral QPM   Continuous Infusions: . sodium chloride 10 mL/hr at 10/21/14 0200   PRN Meds:.acetaminophen **OR** acetaminophen, albuterol, guaiFENesin, hydrALAZINE, HYDROcodone-acetaminophen, ondansetron **OR** ondansetron (ZOFRAN) IV, sodium chloride, zolpidem  Antibiotics:  Anti-infectives    None         Assessment/Plan S/p percutaneous cholecystostomy tube for cholecystitis: it has been in place since May.  I will ask IR to study.  If normal, will discuss with Dr. Hassell Done if he wishes to remove or have the patient follow up with Dr. Arnoldo Morale and make the decision.  If the drain stays in place, then I think its important to further educate her daughters on drain management and when to seek medical attention(ie when symptomatic).  Thank you for the consult.  Will follow along.   Erby Pian, [facility] Surgery Pager [phone])   10/21/2014 10:29 AM

## 2014-10-21 NOTE — Progress Notes (Signed)
Clinical Social Work  Patient ready to DC and CM reports all DC needs addressed. RN requests PTAR for 5 pm pick up. CSW verified address with dtr and prepared PTAR paperwork. PTAR arranged; request #: [id number].  CSW is signing off but available if needed.  [place], [place] [phone]

## 2016-01-05 IMAGING — NM NM PULMONARY VENT & PERF
16 series · 16 of 16 positions shown · non-contrast
Comparison: Chest x-ray 09/15/2014

CLINICAL DATA: Positive D-dimer.

EXAM:
NUCLEAR MEDICINE VENTILATION - PERFUSION LUNG SCAN
TECHNIQUE: Ventilation images were obtained in multiple projections using
inhaled aerosol Hc-66m DTPA. Perfusion images were obtained in
multiple projections after intravenous injection of Hc-66m MAA.
RADIOPHARMACEUTICALS:  40 mCi Sechnetium-DDm DTPA aerosol inhalation
and 6 mCi Sechnetium-DDm MAA IV

[Series 1: ant/post vent · 4.14mm/px · 1 of 1 slices shown (1 of 2)]
[im 1/1  full-range]
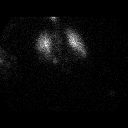

[Series 1: ant/post vent · 4.14mm/px · 1 of 1 slices shown (2 of 2)]
[im 1/1  full-range]
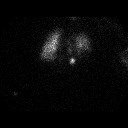

[Series 2: lao/rpo vent · 4.14mm/px · 1 of 1 slices shown (1 of 2)]
[im 1/1  full-range]
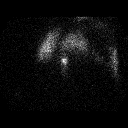

[Series 2: lao/rpo vent · 4.14mm/px · 1 of 1 slices shown (2 of 2)]
[im 1/1  full-range]
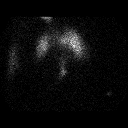

[Series 3: lpo/rao vent · 4.14mm/px · 1 of 1 slices shown (1 of 2)]
[im 1/1  full-range]
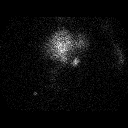

[Series 3: lpo/rao vent · 4.14mm/px · 1 of 1 slices shown (2 of 2)]
[im 1/1  full-range]
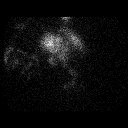

[Series 4: lt lat/rt lat vent · 4.14mm/px · 1 of 1 slices shown (1 of 2)]
[im 1/1  full-range]
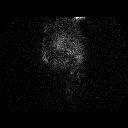

[Series 4: lt lat/rt lat vent · 4.14mm/px · 1 of 1 slices shown (2 of 2)]
[im 1/1  full-range]
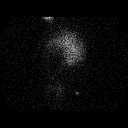

[Series 5: lt lat/rt lat perf · 4.14mm/px · 1 of 1 slices shown (1 of 2)]
[im 1/1]
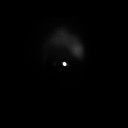

[Series 5: lt lat/rt lat perf · 4.14mm/px · 1 of 1 slices shown (2 of 2)]
[im 1/1]
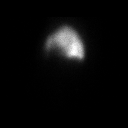

[Series 6: lpo/rao perf · 4.14mm/px · 1 of 1 slices shown (1 of 2)]
[im 1/1]
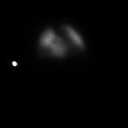

[Series 6: lpo/rao perf · 4.14mm/px · 1 of 1 slices shown (2 of 2)]
[im 1/1]
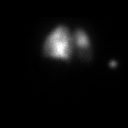

[Series 7: ant/post perf · 4.14mm/px · 1 of 1 slices shown (1 of 2)]
[im 1/1]
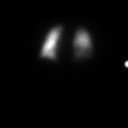

[Series 7: ant/post perf · 4.14mm/px · 1 of 1 slices shown (2 of 2)]
[im 1/1]
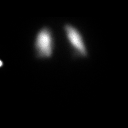

[Series 8: lao/rpo perf · 4.14mm/px · 1 of 1 slices shown (1 of 2)]
[im 1/1]
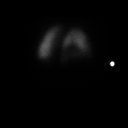

[Series 8: lao/rpo perf · 4.14mm/px · 1 of 1 slices shown (2 of 2)]
[im 1/1]
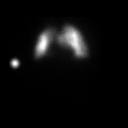

[16 of 16 positions shown; findings below may reference images not displayed]

FINDINGS: Ventilation: No focal ventilation defect.

Perfusion: No wedge shaped peripheral perfusion defects to suggest
acute pulmonary embolism.
IMPRESSION: No evidence of pulmonary embolus.

## 2017-01-09 DEATH — deceased
